# Patient Record
Sex: Male | Born: 1935 | Race: White | Hispanic: No | Marital: Single | Smoking: Never smoker
Health system: Southern US, Community
[De-identification: ages and names within clinical notes are randomized; demographics above are authoritative.]

## PROBLEM LIST (undated history)

## (undated) DIAGNOSIS — I10 Essential (primary) hypertension: Secondary | ICD-10-CM

## (undated) DIAGNOSIS — K219 Gastro-esophageal reflux disease without esophagitis: Secondary | ICD-10-CM

## (undated) DIAGNOSIS — G4700 Insomnia, unspecified: Secondary | ICD-10-CM

## (undated) DIAGNOSIS — F410 Panic disorder [episodic paroxysmal anxiety] without agoraphobia: Secondary | ICD-10-CM

## (undated) DIAGNOSIS — S2239XA Fracture of one rib, unspecified side, initial encounter for closed fracture: Secondary | ICD-10-CM

## (undated) DIAGNOSIS — J449 Chronic obstructive pulmonary disease, unspecified: Secondary | ICD-10-CM

## (undated) DIAGNOSIS — N400 Enlarged prostate without lower urinary tract symptoms: Secondary | ICD-10-CM

## (undated) DIAGNOSIS — E785 Hyperlipidemia, unspecified: Secondary | ICD-10-CM

## (undated) HISTORY — DX: Panic disorder (episodic paroxysmal anxiety): F41.0

## (undated) HISTORY — DX: Essential (primary) hypertension: I10

## (undated) HISTORY — DX: Chronic obstructive pulmonary disease, unspecified: J44.9

## (undated) HISTORY — DX: Gastro-esophageal reflux disease without esophagitis: K21.9

## (undated) HISTORY — DX: Benign prostatic hyperplasia without lower urinary tract symptoms: N40.0

## (undated) HISTORY — DX: Hyperlipidemia, unspecified: E78.5

## (undated) HISTORY — DX: Insomnia, unspecified: G47.00

## (undated) HISTORY — PX: CHOLECYSTECTOMY: SHX55

## (undated) HISTORY — DX: Fracture of one rib, unspecified side, initial encounter for closed fracture: S22.39XA

---

## 2001-06-30 ENCOUNTER — Encounter: Payer: Self-pay | Admitting: Family Medicine

## 2001-06-30 ENCOUNTER — Encounter: Admission: RE | Admit: 2001-06-30 | Discharge: 2001-06-30 | Payer: Self-pay | Admitting: Family Medicine

## 2001-07-12 ENCOUNTER — Ambulatory Visit
Admission: RE | Admit: 2001-07-12 | Discharge: 2001-07-12 | Payer: Self-pay | Admitting: Certified Registered Nurse Anesthetist

## 2003-10-17 ENCOUNTER — Emergency Department (HOSPITAL_COMMUNITY): Admission: EM | Admit: 2003-10-17 | Discharge: 2003-10-17 | Payer: Self-pay | Admitting: Emergency Medicine

## 2003-10-18 ENCOUNTER — Inpatient Hospital Stay (HOSPITAL_COMMUNITY): Admission: EM | Admit: 2003-10-18 | Discharge: 2003-10-19 | Payer: Self-pay | Admitting: Emergency Medicine

## 2004-03-16 ENCOUNTER — Emergency Department (HOSPITAL_COMMUNITY): Admission: EM | Admit: 2004-03-16 | Discharge: 2004-03-16 | Payer: Self-pay | Admitting: Emergency Medicine

## 2004-07-30 ENCOUNTER — Ambulatory Visit: Payer: Self-pay | Admitting: Pulmonary Disease

## 2004-08-05 ENCOUNTER — Ambulatory Visit: Payer: Self-pay | Admitting: Pulmonary Disease

## 2004-09-19 ENCOUNTER — Ambulatory Visit: Payer: Self-pay | Admitting: Internal Medicine

## 2004-09-19 ENCOUNTER — Inpatient Hospital Stay (HOSPITAL_COMMUNITY): Admission: AD | Admit: 2004-09-19 | Discharge: 2004-09-23 | Payer: Self-pay | Admitting: Internal Medicine

## 2004-09-21 ENCOUNTER — Encounter (INDEPENDENT_AMBULATORY_CARE_PROVIDER_SITE_OTHER): Payer: Self-pay | Admitting: *Deleted

## 2004-10-03 ENCOUNTER — Ambulatory Visit: Payer: Self-pay | Admitting: Family Medicine

## 2005-05-21 ENCOUNTER — Ambulatory Visit: Payer: Self-pay | Admitting: Family Medicine

## 2005-06-12 ENCOUNTER — Ambulatory Visit: Payer: Self-pay | Admitting: Family Medicine

## 2005-06-13 ENCOUNTER — Encounter: Admission: RE | Admit: 2005-06-13 | Discharge: 2005-06-13 | Payer: Self-pay | Admitting: Family Medicine

## 2005-09-29 ENCOUNTER — Encounter: Payer: Self-pay | Admitting: Family Medicine

## 2005-09-29 LAB — CONVERTED CEMR LAB

## 2006-02-24 ENCOUNTER — Ambulatory Visit: Payer: Self-pay | Admitting: Family Medicine

## 2006-06-04 ENCOUNTER — Ambulatory Visit: Payer: Self-pay | Admitting: Family Medicine

## 2006-10-05 ENCOUNTER — Ambulatory Visit: Payer: Self-pay | Admitting: Gastroenterology

## 2006-10-19 ENCOUNTER — Ambulatory Visit: Payer: Self-pay | Admitting: Gastroenterology

## 2006-10-31 ENCOUNTER — Ambulatory Visit: Payer: Self-pay | Admitting: Family Medicine

## 2006-11-05 ENCOUNTER — Ambulatory Visit: Payer: Self-pay | Admitting: Family Medicine

## 2006-11-09 ENCOUNTER — Ambulatory Visit: Payer: Self-pay | Admitting: Family Medicine

## 2007-02-05 ENCOUNTER — Ambulatory Visit: Payer: Self-pay | Admitting: Internal Medicine

## 2007-05-07 ENCOUNTER — Encounter: Payer: Self-pay | Admitting: Family Medicine

## 2007-05-07 DIAGNOSIS — G4700 Insomnia, unspecified: Secondary | ICD-10-CM

## 2007-05-07 DIAGNOSIS — J4489 Other specified chronic obstructive pulmonary disease: Secondary | ICD-10-CM | POA: Insufficient documentation

## 2007-05-07 DIAGNOSIS — F410 Panic disorder [episodic paroxysmal anxiety] without agoraphobia: Secondary | ICD-10-CM

## 2007-05-07 DIAGNOSIS — J449 Chronic obstructive pulmonary disease, unspecified: Secondary | ICD-10-CM

## 2007-06-08 ENCOUNTER — Ambulatory Visit: Payer: Self-pay | Admitting: Family Medicine

## 2007-06-08 DIAGNOSIS — K219 Gastro-esophageal reflux disease without esophagitis: Secondary | ICD-10-CM | POA: Insufficient documentation

## 2007-06-08 DIAGNOSIS — E785 Hyperlipidemia, unspecified: Secondary | ICD-10-CM | POA: Insufficient documentation

## 2007-06-22 ENCOUNTER — Ambulatory Visit: Payer: Self-pay | Admitting: Family Medicine

## 2007-11-02 ENCOUNTER — Encounter: Admission: RE | Admit: 2007-11-02 | Discharge: 2007-11-02 | Payer: Self-pay | Admitting: Family Medicine

## 2007-11-02 ENCOUNTER — Ambulatory Visit: Payer: Self-pay | Admitting: Family Medicine

## 2007-11-04 ENCOUNTER — Ambulatory Visit: Payer: Self-pay | Admitting: Family Medicine

## 2007-11-13 DIAGNOSIS — L509 Urticaria, unspecified: Secondary | ICD-10-CM

## 2007-11-15 ENCOUNTER — Ambulatory Visit: Payer: Self-pay | Admitting: Family Medicine

## 2007-12-13 ENCOUNTER — Ambulatory Visit: Payer: Self-pay | Admitting: Family Medicine

## 2007-12-13 DIAGNOSIS — L259 Unspecified contact dermatitis, unspecified cause: Secondary | ICD-10-CM | POA: Insufficient documentation

## 2008-06-09 ENCOUNTER — Ambulatory Visit: Payer: Self-pay | Admitting: Internal Medicine

## 2008-06-09 ENCOUNTER — Encounter: Payer: Self-pay | Admitting: Family Medicine

## 2008-06-09 LAB — CONVERTED CEMR LAB
Bilirubin Urine: NEGATIVE
Blood in Urine, dipstick: NEGATIVE
Glucose, Urine, Semiquant: NEGATIVE
Ketones, urine, test strip: NEGATIVE
Nitrite: POSITIVE
Protein, U semiquant: NEGATIVE
Specific Gravity, Urine: 1.02
Urobilinogen, UA: 0.2
pH: 6

## 2008-06-13 ENCOUNTER — Encounter: Payer: Self-pay | Admitting: Family Medicine

## 2008-06-14 ENCOUNTER — Ambulatory Visit: Payer: Self-pay | Admitting: Family Medicine

## 2008-06-14 DIAGNOSIS — B351 Tinea unguium: Secondary | ICD-10-CM | POA: Insufficient documentation

## 2008-06-14 LAB — CONVERTED CEMR LAB
ALT: 21 units/L (ref 0–53)
AST: 20 units/L (ref 0–37)
Albumin: 4 g/dL (ref 3.5–5.2)
Alkaline Phosphatase: 41 units/L (ref 39–117)
BUN: 15 mg/dL (ref 6–23)
Basophils Absolute: 0 10*3/uL (ref 0.0–0.1)
Basophils Relative: 0.2 % (ref 0.0–3.0)
Bilirubin Urine: NEGATIVE
Bilirubin, Direct: 0.2 mg/dL (ref 0.0–0.3)
Blood in Urine, dipstick: NEGATIVE
CO2: 30 meq/L (ref 19–32)
Calcium: 9.3 mg/dL (ref 8.4–10.5)
Chloride: 108 meq/L (ref 96–112)
Cholesterol: 130 mg/dL (ref 0–200)
Creatinine, Ser: 1.1 mg/dL (ref 0.4–1.5)
Eosinophils Absolute: 0.1 10*3/uL (ref 0.0–0.7)
Eosinophils Relative: 2.1 % (ref 0.0–5.0)
GFR calc Af Amer: 83 mL/min
GFR calc non Af Amer: 69 mL/min
Glucose, Bld: 96 mg/dL (ref 70–99)
Glucose, Urine, Semiquant: NEGATIVE
HCT: 40.2 % (ref 39.0–52.0)
HDL: 53.7 mg/dL (ref >39.0)
Hemoglobin: 13.9 g/dL (ref 13.0–17.0)
Ketones, urine, test strip: NEGATIVE
LDL Cholesterol: 64 mg/dL (ref 0–99)
Lymphocytes Relative: 23.8 % (ref 12.0–46.0)
MCHC: 34.7 g/dL (ref 30.0–36.0)
MCV: 93.7 fL (ref 78.0–100.0)
Monocytes Absolute: 0.5 10*3/uL (ref 0.1–1.0)
Monocytes Relative: 9.2 % (ref 3.0–12.0)
Neutro Abs: 3.3 10*3/uL (ref 1.4–7.7)
Neutrophils Relative %: 64.7 % (ref 43.0–77.0)
Nitrite: NEGATIVE
PSA: 2.44 ng/mL (ref 0.10–4.00)
Platelets: 183 10*3/uL (ref 150–400)
Potassium: 3.9 meq/L (ref 3.5–5.1)
Protein, U semiquant: NEGATIVE
RBC: 4.28 M/uL (ref 4.22–5.81)
RDW: 12.8 % (ref 11.5–14.6)
Sodium: 142 meq/L (ref 135–145)
Specific Gravity, Urine: 1.02
TSH: 0.81 microintl units/mL (ref 0.35–5.50)
Total Bilirubin: 1.3 mg/dL — ABNORMAL HIGH (ref 0.3–1.2)
Total CHOL/HDL Ratio: 2.4
Total Protein: 7.1 g/dL (ref 6.0–8.3)
Triglycerides: 60 mg/dL (ref 0–149)
Urobilinogen, UA: 1
VLDL: 12 mg/dL (ref 0–40)
WBC Urine, dipstick: NEGATIVE
WBC: 5.1 10*3/uL (ref 4.5–10.5)
pH: 7

## 2008-12-23 ENCOUNTER — Ambulatory Visit: Payer: Self-pay | Admitting: *Deleted

## 2009-01-15 ENCOUNTER — Ambulatory Visit: Payer: Self-pay | Admitting: Family Medicine

## 2009-01-15 DIAGNOSIS — IMO0002 Reserved for concepts with insufficient information to code with codable children: Secondary | ICD-10-CM

## 2009-05-03 ENCOUNTER — Ambulatory Visit: Payer: Self-pay | Admitting: Family Medicine

## 2009-06-07 ENCOUNTER — Ambulatory Visit: Payer: Self-pay | Admitting: Family Medicine

## 2009-08-16 ENCOUNTER — Ambulatory Visit: Payer: Self-pay | Admitting: Family Medicine

## 2009-08-16 DIAGNOSIS — R350 Frequency of micturition: Secondary | ICD-10-CM | POA: Insufficient documentation

## 2009-08-16 LAB — CONVERTED CEMR LAB
Bilirubin Urine: NEGATIVE
Glucose, Urine, Semiquant: NEGATIVE
Ketones, urine, test strip: NEGATIVE
Nitrite: POSITIVE
Protein, U semiquant: 100
Specific Gravity, Urine: 1.01
Urobilinogen, UA: 0.2
pH: 7

## 2009-08-17 ENCOUNTER — Ambulatory Visit: Payer: Self-pay | Admitting: Family Medicine

## 2009-08-17 DIAGNOSIS — B372 Candidiasis of skin and nail: Secondary | ICD-10-CM | POA: Insufficient documentation

## 2009-09-05 ENCOUNTER — Ambulatory Visit: Payer: Self-pay | Admitting: Family Medicine

## 2009-09-11 ENCOUNTER — Ambulatory Visit: Payer: Self-pay | Admitting: Family Medicine

## 2010-02-04 ENCOUNTER — Ambulatory Visit: Payer: Self-pay | Admitting: Family Medicine

## 2010-02-08 ENCOUNTER — Ambulatory Visit: Payer: Self-pay | Admitting: Family Medicine

## 2010-06-13 ENCOUNTER — Ambulatory Visit: Payer: Self-pay | Admitting: Family Medicine

## 2010-06-14 ENCOUNTER — Telehealth: Payer: Self-pay | Admitting: *Deleted

## 2010-10-27 LAB — CONVERTED CEMR LAB
ALT: 14 units/L (ref 0–53)
ALT: 15 units/L (ref 0–53)
ALT: 15 units/L (ref 0–53)
AST: 15 units/L (ref 0–37)
AST: 15 units/L (ref 0–37)
AST: 16 units/L (ref 0–37)
Albumin: 3.9 g/dL (ref 3.5–5.2)
Albumin: 4 g/dL (ref 3.5–5.2)
Albumin: 4.1 g/dL (ref 3.5–5.2)
Alkaline Phosphatase: 43 units/L (ref 39–117)
Alkaline Phosphatase: 47 units/L (ref 39–117)
Alkaline Phosphatase: 50 units/L (ref 39–117)
BUN: 11 mg/dL (ref 6–23)
BUN: 14 mg/dL (ref 6–23)
BUN: 16 mg/dL (ref 6–23)
Basophils Absolute: 0 10*3/uL (ref 0.0–0.1)
Basophils Absolute: 0 10*3/uL (ref 0.0–0.1)
Basophils Absolute: 0 10*3/uL (ref 0.0–0.1)
Basophils Relative: 0 % (ref 0.0–3.0)
Basophils Relative: 0.1 % (ref 0.0–1.0)
Basophils Relative: 0.1 % (ref 0.0–3.0)
Bilirubin Urine: NEGATIVE
Bilirubin, Direct: 0.1 mg/dL (ref 0.0–0.3)
Bilirubin, Direct: 0.2 mg/dL (ref 0.0–0.3)
Bilirubin, Direct: 0.3 mg/dL (ref 0.0–0.3)
Blood in Urine, dipstick: NEGATIVE
CO2: 30 meq/L (ref 19–32)
CO2: 32 meq/L (ref 19–32)
CO2: 32 meq/L (ref 19–32)
Calcium: 9.1 mg/dL (ref 8.4–10.5)
Calcium: 9.4 mg/dL (ref 8.4–10.5)
Calcium: 9.5 mg/dL (ref 8.4–10.5)
Chloride: 100 meq/L (ref 96–112)
Chloride: 102 meq/L (ref 96–112)
Chloride: 102 meq/L (ref 96–112)
Cholesterol: 156 mg/dL (ref 0–200)
Cholesterol: 160 mg/dL (ref 0–200)
Cholesterol: 169 mg/dL (ref 0–200)
Creatinine, Ser: 0.9 mg/dL (ref 0.4–1.5)
Creatinine, Ser: 1 mg/dL (ref 0.4–1.5)
Creatinine, Ser: 1 mg/dL (ref 0.4–1.5)
Eosinophils Absolute: 0.1 10*3/uL (ref 0.0–0.6)
Eosinophils Absolute: 0.1 10*3/uL (ref 0.0–0.7)
Eosinophils Absolute: 0.2 10*3/uL (ref 0.0–0.7)
Eosinophils Relative: 1.1 % (ref 0.0–5.0)
Eosinophils Relative: 1.4 % (ref 0.0–5.0)
Eosinophils Relative: 3.4 % (ref 0.0–5.0)
GFR calc Af Amer: 106 mL/min
GFR calc non Af Amer: 76.75 mL/min (ref >60)
GFR calc non Af Amer: 78.35 mL/min (ref >60)
GFR calc non Af Amer: 87 mL/min
Glucose, Bld: 88 mg/dL (ref 70–99)
Glucose, Bld: 88 mg/dL (ref 70–99)
Glucose, Bld: 96 mg/dL (ref 70–99)
Glucose, Urine, Semiquant: NEGATIVE
HCT: 37.9 % — ABNORMAL LOW (ref 39.0–52.0)
HCT: 38.1 % — ABNORMAL LOW (ref 39.0–52.0)
HCT: 41.9 % (ref 39.0–52.0)
HDL: 41.9 mg/dL (ref >39.0)
HDL: 43 mg/dL (ref >39.00)
HDL: 48.3 mg/dL (ref >39.00)
Hemoglobin: 13.4 g/dL (ref 13.0–17.0)
Hemoglobin: 13.4 g/dL (ref 13.0–17.0)
Hemoglobin: 14.8 g/dL (ref 13.0–17.0)
LDL Cholesterol: 103 mg/dL — ABNORMAL HIGH (ref 0–99)
LDL Cholesterol: 112 mg/dL — ABNORMAL HIGH (ref 0–99)
LDL Cholesterol: 97 mg/dL (ref 0–99)
Lymphocytes Relative: 25 % (ref 12.0–46.0)
Lymphocytes Relative: 25 % (ref 12.0–46.0)
Lymphocytes Relative: 30.4 % (ref 12.0–46.0)
Lymphs Abs: 1.3 10*3/uL (ref 0.7–4.0)
Lymphs Abs: 1.5 10*3/uL (ref 0.7–4.0)
MCHC: 35.1 g/dL (ref 30.0–36.0)
MCHC: 35.4 g/dL (ref 30.0–36.0)
MCHC: 35.5 g/dL (ref 30.0–36.0)
MCV: 90.2 fL (ref 78.0–100.0)
MCV: 95 fL (ref 78.0–100.0)
MCV: 97.9 fL (ref 78.0–100.0)
Monocytes Absolute: 0.4 10*3/uL (ref 0.1–1.0)
Monocytes Absolute: 0.4 10*3/uL (ref 0.1–1.0)
Monocytes Absolute: 0.5 10*3/uL (ref 0.2–0.7)
Monocytes Relative: 7 % (ref 3.0–12.0)
Monocytes Relative: 7.8 % (ref 3.0–11.0)
Monocytes Relative: 8.2 % (ref 3.0–12.0)
Neutro Abs: 2.8 10*3/uL (ref 1.4–7.7)
Neutro Abs: 3.6 10*3/uL (ref 1.4–7.7)
Neutro Abs: 4.5 10*3/uL (ref 1.4–7.7)
Neutrophils Relative %: 58 % (ref 43.0–77.0)
Neutrophils Relative %: 65.7 % (ref 43.0–77.0)
Neutrophils Relative %: 66.8 % (ref 43.0–77.0)
Nitrite: NEGATIVE
PSA: 0.94 ng/mL (ref 0.10–4.00)
PSA: 1.16 ng/mL (ref 0.10–4.00)
PSA: 1.43 ng/mL (ref 0.10–4.00)
Platelets: 146 10*3/uL — ABNORMAL LOW (ref 150.0–400.0)
Platelets: 161 10*3/uL (ref 150.0–400.0)
Platelets: 202 10*3/uL (ref 150–400)
Potassium: 3.6 meq/L (ref 3.5–5.1)
Potassium: 3.9 meq/L (ref 3.5–5.1)
Potassium: 4.4 meq/L (ref 3.5–5.1)
RBC: 3.87 M/uL — ABNORMAL LOW (ref 4.22–5.81)
RBC: 4.01 M/uL — ABNORMAL LOW (ref 4.22–5.81)
RBC: 4.64 M/uL (ref 4.22–5.81)
RDW: 12.3 % (ref 11.5–14.6)
RDW: 12.9 % (ref 11.5–14.6)
RDW: 13.9 % (ref 11.5–14.6)
Sodium: 138 meq/L (ref 135–145)
Sodium: 140 meq/L (ref 135–145)
Sodium: 141 meq/L (ref 135–145)
Specific Gravity, Urine: 1.02
TSH: 0.88 microintl units/mL (ref 0.35–5.50)
TSH: 0.93 microintl units/mL (ref 0.35–5.50)
TSH: 1.54 microintl units/mL (ref 0.35–5.50)
Total Bilirubin: 1.3 mg/dL — ABNORMAL HIGH (ref 0.3–1.2)
Total Bilirubin: 1.4 mg/dL — ABNORMAL HIGH (ref 0.3–1.2)
Total Bilirubin: 1.5 mg/dL — ABNORMAL HIGH (ref 0.3–1.2)
Total CHOL/HDL Ratio: 3
Total CHOL/HDL Ratio: 4
Total CHOL/HDL Ratio: 4
Total Protein: 6.7 g/dL (ref 6.0–8.3)
Total Protein: 6.8 g/dL (ref 6.0–8.3)
Total Protein: 6.8 g/dL (ref 6.0–8.3)
Triglycerides: 56 mg/dL (ref 0.0–149.0)
Triglycerides: 70 mg/dL (ref 0.0–149.0)
Triglycerides: 75 mg/dL (ref 0–149)
Urobilinogen, UA: 2
VLDL: 11.2 mg/dL (ref 0.0–40.0)
VLDL: 14 mg/dL (ref 0.0–40.0)
VLDL: 15 mg/dL (ref 0–40)
WBC Urine, dipstick: NEGATIVE
WBC: 4.9 10*3/uL (ref 4.5–10.5)
WBC: 5.4 10*3/uL (ref 4.5–10.5)
WBC: 6.8 10*3/uL (ref 4.5–10.5)
pH: 7.5

## 2010-10-31 NOTE — Assessment & Plan Note (Signed)
Summary: 4 day rov per doc/njr   Vital Signs:  Patient profile:   77 year old male Weight:      146 pounds Temp:     98.2 degrees F oral BP sitting:   120 / 70  (right arm) CC: FU / Upper Resp   Primary Care Provider:  Roderick Pee MD  CC:  FU / Upper Resp.  History of Present Illness: Peter Wolfe is a 77 year old male with underlying COPD and asthma who comes in today for follow-up of asthma.  He had a flare and was seen 3 days ago and started on prednisone 40 mg daily.  He comes back today for follow-up.  States he feels much better.  Has some sleep dysfunction from the prednisone.  Otherwise well.    Allergies: 1)  ! Fluvirin (Influenza Vac Typ A&b Surf Ant) 2)  ! Hydrocodone-Ibuprofen (Hydrocodone-Ibuprofen)  Past History:  Past medical, surgical, family and social histories (including risk factors) reviewed for relevance to current acute and chronic problems.  Past Medical History: Reviewed history from 06/08/2007 and no changes required. COpd insomnia panic attack Hyperlipidemia GERD htn bph  Past Surgical History: Reviewed history from 12/23/2008 and no changes required. Denies surgical history  Family History: Reviewed history and no changes required.  Social History: Reviewed history from 11/02/2007 and no changes required. Retired Single Never Smoked Alcohol use-no Drug use-no Regular exercise-yes  Review of Systems      See HPI  Physical Exam  General:  Well-developed,well-nourished,in no acute distress; alert,appropriate and cooperative throughout examination Lungs:  Normal respiratory effort, chest expands symmetrically. Lungs are clear to auscultation, no crackles or wheezes.   Impression & Recommendations:  Problem # 1:  COPD (ICD-496) Assessment Improved  His updated medication list for this problem includes:    Proventil Hfa 108 (90 Base) Mcg/act Aers (Albuterol sulfate) .Marland Kitchen... 2 ps qid  Complete Medication List: 1)  Prevacid 30  Mg Cpdr (Lansoprazole) .... Take 1 capsule by mouth once a day 2)  Catapres 0.1 Mg Tabs (Clonidine hcl) .... Take 1 tablet by mouth once a day 3)  Maxzide 75-50 Mg Tabs (Triamterene-hctz) .... Take 1 tablet by mouth once a day 4)  Flomax 0.4 Mg Cp24 (Tamsulosin hcl) .... Take 1 capsule by mouth once a day 5)  Aspirin 325 Mg Tbec (Aspirin) .... Once tab monday wednesday friday 6)  Buspar 30 Mg Tabs (Buspirone hcl) .Marland Kitchen.. 1 tab @ bedtime 7)  Prozac 20 Mg Caps (Fluoxetine hcl) .... Take 1 capsule by mouth once a day 8)  Trazodone Hcl 100 Mg Tabs (Trazodone hcl) .... Take 1 tablet by mouth at bedtime 9)  Simvastatin 10 Mg Tabs (Simvastatin) .... Take 1 tablet by mouth at bedtime 10)  Vitamin D 1000 Unit Tabs (Cholecalciferol) .... Once daily 11)  Fish Oil 1000 Mg Caps (Omega-3 fatty acids) .... Once daily 12)  Proventil Hfa 108 (90 Base) Mcg/act Aers (Albuterol sulfate) .... 2 ps qid 13)  Doxycycline Hyclate 100 Mg Caps (Doxycycline hyclate) .... Take 1 tablet by mouth two times a day 14)  Prednisone 20 Mg Tabs (Prednisone) .... Uad  Patient Instructions: 1)  taper the prednisone by taking one tablet x 3 days, a half x 3 days, then half a tablet every other day for two week taper. 2)  Finish the doxycycline.  Stop the albuterol

## 2010-10-31 NOTE — Assessment & Plan Note (Signed)
Summary: congestion-cough//ccm   Vital Signs:  Patient profile:   77 year old male Weight:      146 pounds BMI:     23.30 Temp:     98.1 degrees F oral BP sitting:   118 / 70  (left arm) Cuff size:   regular  Vitals Entered By: Kathrynn Speed CMA (Feb 04, 2010 10:35 AM) CC: chest congestion, cough Is Patient Diabetic? No   Primary Care Provider:  Roderick Pee MD  CC:  chest congestion and cough.  History of Present Illness: Peter Wolfe is a 77 year old single male, nonsmoker, with underlying COPD, who comes in with a two week history of chest congestion, cough, and wheezing.  He said no, fever or chills.  He says some yellow sputum production.  No blood.  No weight loss, earache, etc.  Allergies: 1)  ! Fluvirin (Influenza Vac Typ A&b Surf Ant) 2)  ! Hydrocodone-Ibuprofen (Hydrocodone-Ibuprofen)  Past History:  Past medical, surgical, family and social histories (including risk factors) reviewed for relevance to current acute and chronic problems.  Past Medical History: Reviewed history from 06/08/2007 and no changes required. COpd insomnia panic attack Hyperlipidemia GERD htn bph  Past Surgical History: Reviewed history from 12/23/2008 and no changes required. Denies surgical history  Family History: Reviewed history and no changes required.  Social History: Reviewed history from 11/02/2007 and no changes required. Retired Single Never Smoked Alcohol use-no Drug use-no Regular exercise-yes  Review of Systems      See HPI  Physical Exam  General:  Well-developed,well-nourished,in no acute distress; alert,appropriate and cooperative throughout examination Head:  Normocephalic and atraumatic without obvious abnormalities. No apparent alopecia or balding. Eyes:  No corneal or conjunctival inflammation noted. EOMI. Perrla. Funduscopic exam benign, without hemorrhages, exudates or papilledema. Vision grossly normal. Ears:  External ear exam shows no  significant lesions or deformities.  Otoscopic examination reveals clear canals, tympanic membranes are intact bilaterally without bulging, retraction, inflammation or discharge. Hearing is grossly normal bilaterally. Nose:  External nasal examination shows no deformity or inflammation. Nasal mucosa are pink and moist without lesions or exudates. Mouth:  Oral mucosa and oropharynx without lesions or exudates.  Teeth in good repair. Neck:  No deformities, masses, or tenderness noted. Chest Wall:  No deformities, masses, tenderness or gynecomastia noted. Lungs:  symmetrical bilateral wheezing, inspiratory and expiratory   Impression & Recommendations:  Problem # 1:  COPD (ICD-496) Assessment Deteriorated  His updated medication list for this problem includes:    Proventil Hfa 108 (90 Base) Mcg/act Aers (Albuterol sulfate) .Marland Kitchen... 2 ps qid  Complete Medication List: 1)  Prevacid 30 Mg Cpdr (Lansoprazole) .... Take 1 capsule by mouth once a day 2)  Catapres 0.1 Mg Tabs (Clonidine hcl) .... Take 1 tablet by mouth once a day 3)  Maxzide 75-50 Mg Tabs (Triamterene-hctz) .... Take 1 tablet by mouth once a day 4)  Flomax 0.4 Mg Cp24 (Tamsulosin hcl) .... Take 1 capsule by mouth once a day 5)  Aspirin 325 Mg Tbec (Aspirin) .... Once tab monday wednesday friday 6)  Buspar 30 Mg Tabs (Buspirone hcl) .Marland Kitchen.. 1 tab @ bedtime 7)  Prozac 20 Mg Caps (Fluoxetine hcl) .... Take 1 capsule by mouth once a day 8)  Trazodone Hcl 100 Mg Tabs (Trazodone hcl) .... Take 1 tablet by mouth at bedtime 9)  Simvastatin 10 Mg Tabs (Simvastatin) .... Take 1 tablet by mouth at bedtime 10)  Vitamin D 1000 Unit Tabs (Cholecalciferol) .... Once daily 11)  Lidex 0.05 % Crea (Fluocinonide) .... Apply two times a day 12)  Fish Oil 1000 Mg Caps (Omega-3 fatty acids) .... Once daily 13)  Proventil Hfa 108 (90 Base) Mcg/act Aers (Albuterol sulfate) .... 2 ps qid 14)  Doxycycline Hyclate 100 Mg Caps (Doxycycline hyclate) .... Take 1  tablet by mouth two times a day 15)  Prednisone 20 Mg Tabs (Prednisone) .... Uad  Patient Instructions: 1)  begin prednisone two tabs daily, drink, 30 ounces of water daily, vaporizer and your bedroom at night, began doxycycline 100 mg b.i.d. 2)  Also, albuterol two puffs 4 times a day. 3)  Return Friday for follow-up Prescriptions: PREDNISONE 20 MG TABS (PREDNISONE) UAD  #50 x 1   Entered and Authorized by:   Roderick Pee MD   Signed by:   Roderick Pee MD on 02/04/2010   Method used:   Electronically to        [facility]  [phone]* (retail)       [address] [place], [place]  [address]       Ph: [phone] or [phone]       Fax: [phone]   RxID:   [id number] PROVENTIL HFA 108 (90 BASE) MCG/ACT AERS (ALBUTEROL SULFATE) 2 ps qid  #1 x 1   Entered and Authorized by:   Roderick Pee MD   Signed by:   Roderick Pee MD on 02/04/2010   Method used:   Electronically to        [facility]  [phone]* (retail)       [address] [place], [place]  [address]       Ph: [phone] or [phone]       Fax: [phone]   RxID:   [id number] DOXYCYCLINE HYCLATE 100 MG CAPS (DOXYCYCLINE HYCLATE) Take 1 tablet by mouth two times a day  #30 x 1   Entered and Authorized by:   Roderick Pee MD   Signed by:   Roderick Pee MD on 02/04/2010   Method used:   Electronically to        [facility]  [phone]* (retail)       [address] [place], [place]  [address]       Ph: [phone] or [phone]       Fax: [phone]   RxID:   [id number]

## 2010-10-31 NOTE — Progress Notes (Signed)
Summary: lab results  Phone Note Call from Patient Call back at Home Phone [phone]   Caller: Patient Call For: Roderick Pee MD Summary of Call: pt is returning Karmon Andis call needs lab results Initial call taken by: Heron Sabins,  June 14, 2010 9:23 AM  Follow-up for Phone Call        Called pt he is aware of lab results, mailing copy today Follow-up by: Kathrynn Speed CMA,  June 14, 2010 9:28 AM

## 2010-10-31 NOTE — Assessment & Plan Note (Signed)
Summary: pt will come in fasting/njr   Vital Signs:  Patient profile:   77 year old male Height:      66.5 inches Weight:      142 pounds BMI:     22.66 Temp:     98.5 degrees F oral BP sitting:   128 / 80  (right arm) Cuff size:   regular  Vitals Entered By: Kathrynn Speed CMA (June 13, 2010 9:10 AM) CC: cpx, fasting, src Is Patient Diabetic? No   Primary Care Provider:  Roderick Pee MD  CC:  cpx, fasting, and src.  History of Present Illness: Peter Wolfe is a 77 year old single male, Dan Humphreys, who comes in today for general physical examination because of underlying reflux esophagitis, hypertension, depression, hyperlipidemia, BPH.  His reflux esophagitis is treated with Prevacid 30 mg daily asymptomatic on medication.  For hypertension.  He takes Catapres, .1 mg daily, Maxzide, 7550 daily, BP 120/80.  He also takes Prozac 20 mg nightly along with trazodone 100 mg nightly and BuSpar 30 mg nightly for sleep dysfunction.  Doing well sleeping well.  He also takes simvastatin 10 mg daily for hyperlipidemia, and Flomax .4, daily for BPH.  He gets routine eye care, dental care, colonoscopy, normal, tetanus, 2002, Pneumovax 2007, shingles 2009, declines flu shot.  He says he is allergic to eggs. Here for Medicare AWV:  1.   Risk factors based on Past M, S, F history:reviewed.  No change 2.   Physical Activities: walks 90 minutes daily 3.   Depression/mood: good mood.  No depression 4.   Hearing: normal 5.   ADL's: normal 6.   Fall Risk: we did none 7.   Home Safety: no guns in the house 8.   Height, weight, &visual acuity:height weight stable and Uy exam 9.   Counseling: continue good health habits 10.   Labs ordered based on risk factors: done today 11.           Referral Coordination........none indicated 12.           Care Plan.......Marland Kitchenreviewed all medications suggest he takes Flomax, Monday, Wednesday, Friday 13.            Cognitive Assessment ........normal  Preventive  Screening-Counseling & Management  Alcohol-Tobacco     Smoking Status: never  Current Medications (verified): 1)  Prevacid 30 Mg  Cpdr (Lansoprazole) .... Take 1 Capsule By Mouth Once A Day 2)  Catapres 0.1 Mg  Tabs (Clonidine Hcl) .... Take 1 Tablet By Mouth Once A Day 3)  Maxzide 75-50 Mg  Tabs (Triamterene-Hctz) .... Take 1 Tablet By Mouth Once A Day 4)  Flomax 0.4 Mg  Cp24 (Tamsulosin Hcl) .... Take 1 Capsule By Mouth Once A Day 5)  Aspirin 325 Mg  Tbec (Aspirin) .... Once Tab Monday Wednesday Friday 6)  Buspar 30 Mg  Tabs (Buspirone Hcl) .Marland Kitchen.. 1 Tab @ Bedtime 7)  Prozac 20 Mg  Caps (Fluoxetine Hcl) .... Take 1 Capsule By Mouth Once A Day 8)  Trazodone Hcl 100 Mg  Tabs (Trazodone Hcl) .... Take 1 Tablet By Mouth At Bedtime 9)  Simvastatin 10 Mg  Tabs (Simvastatin) .... Take 1 Tablet By Mouth At Bedtime 10)  Vitamin D 1000 Unit Tabs (Cholecalciferol) .... Once Daily 11)  Fish Oil 1000 Mg Caps (Omega-3 Fatty Acids) .... Once Daily  Allergies (verified): 1)  ! Fluvirin (Influenza Vac Typ A&b Surf Ant) 2)  ! Hydrocodone-Ibuprofen (Hydrocodone-Ibuprofen)  Past History:  Past medical, surgical, family and social histories (  including risk factors) reviewed, and no changes noted (except as noted below).  Past Medical History: Reviewed history from 06/08/2007 and no changes required. COpd insomnia panic attack Hyperlipidemia GERD htn bph  Past Surgical History: Reviewed history from 12/23/2008 and no changes required. Denies surgical history  Family History: Reviewed history and no changes required.  Social History: Reviewed history from 11/02/2007 and no changes required. Retired Single Never Smoked Alcohol use-no Drug use-no Regular exercise-yes  Review of Systems      See HPI  Physical Exam  General:  Well-developed,well-nourished,in no acute distress; alert,appropriate and cooperative throughout examination Head:  Normocephalic and atraumatic without obvious  abnormalities. No apparent alopecia or balding. Eyes:  No corneal or conjunctival inflammation noted. EOMI. Perrla. Funduscopic exam benign, without hemorrhages, exudates or papilledema. Vision grossly normal. Ears:  External ear exam shows no significant lesions or deformities.  Otoscopic examination reveals clear canals, tympanic membranes are intact bilaterally without bulging, retraction, inflammation or discharge. Hearing is grossly normal bilaterally. Nose:  External nasal examination shows no deformity or inflammation. Nasal mucosa are pink and moist without lesions or exudates. Mouth:  Oral mucosa and oropharynx without lesions or exudates.  Teeth in good repair. Neck:  No deformities, masses, or tenderness noted. Chest Wall:  No deformities, masses, tenderness or gynecomastia noted. Breasts:  No masses or gynecomastia noted Lungs:  Normal respiratory effort, chest expands symmetrically. Lungs are clear to auscultation, no crackles or wheezes. Heart:  Normal rate and regular rhythm. S1 and S2 normal without gallop, murmur, click, rub or other extra sounds. Abdomen:  Bowel sounds positive,abdomen soft and non-tender without masses, organomegaly or hernias noted. Rectal:  No external abnormalities noted. Normal sphincter tone. No rectal masses or tenderness. Genitalia:  Testes bilaterally descended without nodularity, tenderness or masses. No scrotal masses or lesions. No penis lesions or urethral discharge. Prostate:  Prostate gland firm and smooth, no enlargement, nodularity, tenderness, mass, asymmetry or induration. Msk:  No deformity or scoliosis noted of thoracic or lumbar spine.   Pulses:  R and L carotid,radial,femoral,dorsalis pedis and posterior tibial pulses are full and equal bilaterally Extremities:  No clubbing, cyanosis, edema, or deformity noted with normal full range of motion of all joints.   Neurologic:  No cranial nerve deficits noted. Station and gait are normal. Plantar  reflexes are down-going bilaterally. DTRs are symmetrical throughout. Sensory, motor and coordinative functions appear intact. Skin:  Intact without suspicious lesions or rashes Cervical Nodes:  No lymphadenopathy noted Axillary Nodes:  No palpable lymphadenopathy Inguinal Nodes:  No significant adenopathy Psych:  Cognition and judgment appear intact. Alert and cooperative with normal attention span and concentration. No apparent delusions, illusions, hallucinations   Impression & Recommendations:  Problem # 1:  GERD (ICD-530.81) Assessment Improved  His updated medication list for this problem includes:    Prevacid 30 Mg Cpdr (Lansoprazole) .Marland Kitchen... Take 1 capsule by mouth once a day  Orders: Prescription Created Electronically [phone]) Medicare -1st Annual Wellness Visit [phone]) Urinalysis-dipstick only (Medicare patient) [phone]) Venipuncture (16010) Specimen Handling (93235) TLB-Lipid Panel (80061-LIPID) TLB-BMP (Basic Metabolic Panel-BMET) (80048-METABOL) TLB-CBC Platelet - w/Differential (85025-CBCD) TLB-Hepatic/Liver Function Pnl (80076-HEPATIC) TLB-TSH (Thyroid Stimulating Hormone) (84443-TSH) TLB-PSA (Prostate Specific Antigen) (84153-PSA)  Problem # 2:  HYPERLIPIDEMIA (ICD-272.4) Assessment: Improved  His updated medication list for this problem includes:    Simvastatin 10 Mg Tabs (Simvastatin) .Marland Kitchen... Take 1 tablet by mouth at bedtime  Orders: Prescription Created Electronically [phone]) Medicare -1st Annual Wellness Visit [phone]) Urinalysis-dipstick only (Medicare patient) [phone]) Venipuncture [phone])  EKG w/ Interpretation (93000) Specimen Handling (95284) TLB-Lipid Panel (80061-LIPID) TLB-BMP (Basic Metabolic Panel-BMET) (80048-METABOL) TLB-CBC Platelet - w/Differential (85025-CBCD) TLB-Hepatic/Liver Function Pnl (80076-HEPATIC) TLB-TSH (Thyroid Stimulating Hormone) (84443-TSH) TLB-PSA (Prostate Specific Antigen) (84153-PSA)  Problem # 3:  HEALTH SCREENING  (ICD-V70.0) Assessment: Unchanged  Orders: Prescription Created Electronically [phone]) Medicare -1st Annual Wellness Visit [phone]) Urinalysis-dipstick only (Medicare patient) ([phone]) Venipuncture (03474) EKG w/ Interpretation (93000) Specimen Handling (25956) TLB-Lipid Panel (80061-LIPID) TLB-BMP (Basic Metabolic Panel-BMET) (80048-METABOL) TLB-CBC Platelet - w/Differential (85025-CBCD) TLB-Hepatic/Liver Function Pnl (80076-HEPATIC) TLB-TSH (Thyroid Stimulating Hormone) (84443-TSH) TLB-PSA (Prostate Specific Antigen) (84153-PSA)  Problem # 4:  INSOMNIA (ICD-780.52) Assessment: Improved  Orders: Prescription Created Electronically [phone]) Medicare -1st Annual Wellness Visit [phone]) Urinalysis-dipstick only (Medicare patient) ([phone]) Venipuncture (60630) TLB-Lipid Panel (80061-LIPID) TLB-BMP (Basic Metabolic Panel-BMET) (80048-METABOL) TLB-CBC Platelet - w/Differential (85025-CBCD) TLB-Hepatic/Liver Function Pnl (80076-HEPATIC) TLB-TSH (Thyroid Stimulating Hormone) (84443-TSH) TLB-PSA (Prostate Specific Antigen) (84153-PSA)  Complete Medication List: 1)  Prevacid 30 Mg Cpdr (Lansoprazole) .... Take 1 capsule by mouth once a day 2)  Catapres 0.1 Mg Tabs (Clonidine hcl) .... Take 1 tablet by mouth once a day 3)  Maxzide 75-50 Mg Tabs (Triamterene-hctz) .... Take 1 tablet by mouth once a day 4)  Flomax 0.4 Mg Cp24 (Tamsulosin hcl) .... Take 1 capsule by mouth once a day 5)  Aspirin 325 Mg Tbec (Aspirin) .... Once tab monday wednesday friday 6)  Buspar 30 Mg Tabs (Buspirone hcl) .Marland Kitchen.. 1 tab @ bedtime 7)  Prozac 20 Mg Caps (Fluoxetine hcl) .... Take 1 capsule by mouth once a day 8)  Trazodone Hcl 100 Mg Tabs (Trazodone hcl) .... Take 1 tablet by mouth at bedtime 9)  Simvastatin 10 Mg Tabs (Simvastatin) .... Take 1 tablet by mouth at bedtime 10)  Vitamin D 1000 Unit Tabs (Cholecalciferol) .... Once daily 11)  Fish Oil 1000 Mg Caps (Omega-3 fatty acids) .... Once daily  Patient  Instructions: 1)  Please schedule a follow-up appointment in 1 year. 2)  It is important that you exercise regularly at least 20 minutes 5 times a week. If you develop chest pain, have severe difficulty breathing, or feel very tired , stop exercising immediately and seek medical attention. 3)  decrease the Flomax to one tablet Monday, Wednesday, Friday Prescriptions: SIMVASTATIN 10 MG  TABS (SIMVASTATIN) Take 1 tablet by mouth at bedtime  #100 Tablet x 3   Entered and Authorized by:   Roderick Pee MD   Signed by:   Roderick Pee MD on 06/13/2010   Method used:   Electronically to        [facility]  [phone]* (retail)       [address] [place], [place]  [address]       Ph: [phone] or [phone]       Fax: [phone]   RxID:   [id number] TRAZODONE HCL 100 MG  TABS (TRAZODONE HCL) Take 1 tablet by mouth at bedtime  #100 Tablet x 3   Entered and Authorized by:   Roderick Pee MD   Signed by:   Roderick Pee MD on 06/13/2010   Method used:   Electronically to        [facility]  [phone]* (retail)       [address] [place], [place]  [address]       Ph: [phone] or [phone]       Fax: [phone]   RxID:  [id number] PROZAC 20 MG  CAPS (FLUOXETINE HCL) Take 1 capsule by mouth once a day  #100 x 3   Entered and Authorized by:   Roderick Pee MD   Signed by:   Roderick Pee MD on 06/13/2010   Method used:   Electronically to        [facility]  [phone]* (retail)       [address] [place], [place]  [address]       Ph: [phone] or [phone]       Fax: [phone]   RxID:   [id number] BUSPAR 30 MG  TABS (BUSPIRONE HCL) 1 tab @ bedtime  #100 x 3   Entered and Authorized by:   Roderick Pee MD   Signed by:   Roderick Pee MD on 06/13/2010   Method used:   Electronically to        [facility]  [phone]* (retail)       [address] [place], [place]  [address]       Ph: [phone] or  [phone]       Fax: [phone]   RxID:   [id number] FLOMAX 0.4 MG  CP24 (TAMSULOSIN HCL) Take 1 capsule by mouth once a day  #100 x 2   Entered and Authorized by:   Roderick Pee MD   Signed by:   Roderick Pee MD on 06/13/2010   Method used:   Electronically to        [facility]  [phone]* (retail)       [address] [place], [place]  [address]       Ph: [phone] or [phone]       Fax: [phone]   RxID:   [id number] MAXZIDE 75-50 MG  TABS (TRIAMTERENE-HCTZ) Take 1 tablet by mouth once a day  #100 x 3   Entered and Authorized by:   Roderick Pee MD   Signed by:   Roderick Pee MD on 06/13/2010   Method used:   Electronically to        [facility]  [phone]* (retail)       [address] [place], [place]  [address]       Ph: [phone] or [phone]       Fax: [phone]   RxID:   [id number] CATAPRES 0.1 MG  TABS (CLONIDINE HCL) Take 1 tablet by mouth once a day  #100 Tablet x 3   Entered and Authorized by:   Roderick Pee MD   Signed by:   Roderick Pee MD on 06/13/2010   Method used:   Electronically to        [facility]  [phone]* (retail)       [address] [place], [place]  [address]       Ph: [phone] or [phone]       Fax: [phone]   RxID:   [id number] PREVACID 30 MG  CPDR (LANSOPRAZOLE) Take 1 capsule by mouth once a day  #100 x 3   Entered and Authorized by:   Roderick Pee MD   Signed by:   Roderick Pee MD on 06/13/2010   Method used:   Electronically to        [facility]  #  [address]* (retail)       [address] [place], [place]  [address]       Ph: [phone] or [phone]       Fax: [phone]   RxID:   [id number]     Laboratory Results   Urine Tests    Routine Urinalysis   Color: yellow Appearance: Clear Glucose: negative   (Normal Range: Negative) Bilirubin: negative   (Normal Range: Negative) Ketone: trace (5)   (Normal Range:  Negative) Spec. Gravity: 1.020   (Normal Range: 1.003-1.035) Blood: negative   (Normal Range: Negative) pH: 7.5   (Normal Range: 5.0-8.0) Protein: trace   (Normal Range: Negative) Urobilinogen: 2.0   (Normal Range: 0-1) Nitrite: negative   (Normal Range: Negative) Leukocyte Esterace: negative   (Normal Range: Negative)    Comments: Rita Ohara  June 13, 2010 11:22 AM

## 2011-02-14 NOTE — Consult Note (Signed)
NAMETAE, VONADA NO.:  [id number]   MEDICAL RECORD NO.:  [id number]          PATIENT TYPE:  INP   LOCATION:  2041                         FACILITY:  [facility]   PHYSICIAN:  Ollen Gross. Vernell Morgans, M.D. DATE OF BIRTH:  09/09/1929   DATE OF CONSULTATION:  09/20/2004  DATE OF DISCHARGE:                                   CONSULTATION   [name] is a 77 year old white male who presented yesterday with  epigastric pain that started yesterday morning that was associated with  nausea and vomiting.  He denied any fevers.  His bowels move regularly,  about ever third day, and this has not changed.  Since coming to the  emergency department, his pain has resolved somewhat.  He initially  underwent some cardiac workup which was negative.  He then underwent  ultrasound and CT scan that showed stones in his gallbladder and some  dilated biliary ducts.  His LFTs, though, were normal.  The rest of his  Review of Systems was unremarkable.  He does walk about 5 to 6 miles a day,  tolerates this well.  He has had no chest pain or shortness of breath.   PAST MEDICAL HISTORY:  1.  Exercise-induced reflux from a hiatal hernia.  2.  Hypertension.  3.  BPH.  4.  Gastroesophageal reflux.   PAST SURGICAL HISTORY:  None.   MEDICATIONS:  Includes Advair, Prevacid, Catapres, Lipitor, Maxzide, Flomax,  aspirin, BuSpar, Prozac.   ALLERGIES:  EGGS.   SOCIAL HISTORY:  Denies use of alcohol or tobacco products.   FAMILY HISTORY:  Noncontributory.   PHYSICAL EXAMINATION:  GENERAL:  Well-developed, well-nourished white male  in no acute distress.  SKIN:  Warm and dry with no jaundice.  EYES:  Extraocular muscles intact.  Pupils equal, round, and reactive to  light.  Sclerae nonicteric.  LUNGS: Clear bilaterally with no use of accessory respiratory muscles.  HEART:  Regular rate and rhythm with impulse in left chest.  ABDOMEN: Soft and nontender with no palpable mass or  hepatosplenomegaly.  EXTREMITIES:  No cyanosis, clubbing, or edema.  Good strength in arms and  legs.  PSYCHOLOGIC:  Alert and oriented x 3 with no evidence of anxiety or  depression.   LABORATORY DATA AND OTHER STUDIES:  On review of his lab work, his white  count was slightly elevated at about 15,000.  LFTs were normal.   Ultrasound and CT scan were reviewed and were as described above.   IMPRESSION AND PLAN:  This is a 77 year old white male with epigastric pain,  probably related to his gallstones.  He also has some dilated common bile  ducts, but his liver function tests are normal.  Since his liver functions  tests were normal, I feel it would be reasonable to go ahead with  cholecystectomy to remove the gallbladder as I think his pain is related to  this.  I have discussed with him in detail the risks and benefits of the  operation as well as technical aspects.  He understands and wishes to  proceed.  We will obtain an intraoperative cholangiogram, and if  this shows  anything, then we will need GI to evaluate him for possible ERCP procedure.  He understands this as well.  We will continue making him n.p.o. and  schedule him for surgery either tonight or tomorrow.      Peter Wolfe   PST/MEDQ  D:  09/20/2004  T:  09/22/2004  Job:  [id number]

## 2011-02-14 NOTE — Consult Note (Signed)
Peter Wolfe, Peter Wolfe NO.:  [id number]   MEDICAL RECORD NO.:  [id number]          PATIENT TYPE:  INP   LOCATION:  2041                         FACILITY:  [facility]   PHYSICIAN:  Iva Boop, M.D. LHCDATE OF BIRTH:  08-20-1929   DATE OF CONSULTATION:  09/20/2004  DATE OF DISCHARGE:                                   CONSULTATION   REFERRING PHYSICIANS:  Dr. Eleonore Chiquito.  Dr. Rene Paci.  Dr.  Rushie Goltz.   REASON FOR CONSULTATION:  Gallstones, dilated common duct.   HISTORY:  This is a pleasant 77 year old white man who has been fairly  healthy overall.  He had several hours intense epigastric pain radiating to  the chest.  CT of the abdomen was performed that showed gallstones and a  dilated common bile duct, it measures up to about 10 millimeters on my  analysis on the __________ it is dilated to the ampulla without evidence of  stone or stricture, and the intrahepatics are described as dilated, although  I do not see that as well.  He has a right psoas density and a hernia  expanding bladder on the right inguinal side.  Ultrasound was subsequently  done which shows a 7.4 millimeter common bile duct with sludge and stones in  the gallbladder and at least one stone in the neck of the gallbladder.  He  has not had vomiting at this time, although he has had some nausea, he had  an episode of nausea and vomiting and pain in January, he had gallstones on  an ultrasound by Dr. Maryagnes Amos, did not think his symptoms were from the  gallbladder at the time.  He has had three pneumonias in several years, he  has some reflux problems treated with Protonix or another PPI and feels that  that has helped.  He does not describe obvious oropharyngeal dysphagia or  esophageal dysphagia.   MEDICATIONS:  1.  Unasyn.  2.  Aspirin 325 mg.  3.  BuSpar.  4.  Catapres.  5.  Prozac.  6.  Protonix.   DRUG ALLERGIES:  NO DRUG ALLERGIES.  ALLERGIC TO EGGS.   PAST  MEDICAL HISTORY:  1.  Reflux disease.  2.  Anxiety.  3.  Insomnia.  4.  COPD.  5.  Asthma.  6.  Benign prostatic hypertrophy.   FAMILY HISTORY:  Sister has had gallbladder problems.  No colon cancer.   SOCIAL HISTORY:  No smoking, no alcohol.  He is single.  He is retired  Hotel manager.  His aunt is 80 years old and he is her caretaker.  She is being  placed at this time in a nursing home and there are multiple issues to deal  with there.   REVIEW OF SYSTEMS:  He denies fever.  He is otherwise physically active.  He  is anxious and stressed.  He feels that he has an early cataract.  He has  insomnia problems with trazodone.  He has gained 20 pounds in the last 6  months.  All other systems are negative.   PHYSICAL EXAMINATION:  Reveals a pleasant elderly white man  looking younger  than his stated age.  Temperature 99, blood pressure 148/80, pulse 78,  respirations 20.  EYES:  Anicteric.  MOUTH:  Posterior pharynx free of lesions.  NECK:  Supple, no thyromegaly or mass.  CHEST:  Clear.  HEART:  S1, S2, no gallops.  ABDOMEN:  Soft, mild epigastric and right upper quadrant tenderness without  organomegaly or mass.  EXTREMITIES:  No edema.  He appears somewhat anxious.  LYMPH NODES:  No neck or supraclavicular nodes.  SKIN:  Free of rash.  He is warm, although no febrile at this time.   LABS:  LFTs are normal at this point.  Hemoglobin 13.9, hematocrit 39, white  count 15.9, BMET normal except for glucose 138, Nonfasting.  Albumin is 3.9.  Coags are normal.  X-rays as above, have been self-viewed.   ASSESSMENT:  1.  Cholelithiasis that I think is the cause of his problems.  2.  Dilated biliary bile duct on CT, but not on ultrasound.  I think that      they may be seeing his cystic duct, he may have a low cystic duct.  Dr.      Carolynne Edouard has seen the patient and has planned for a cholecystectomy with      intraoperative cholangiogram.   PLAN:  I agree with cholecystectomy and  intraoperative cholangiogram.  If  there are unexplained issues after the intraoperative cholangiogram, i.e.  stones versus stricture or other cause of dilation, then we could proceed  with an endoscopic retrograde cholangiopancreatogram.  I will be available.  I think it is pretty unlikely he has a retained stone with normal liver  function tests, although certainly you cannot rule that out.   I appreciate the opportunity to care for this patient.   ADDENDUM:  Given his reflux problems, if he has not had a barium swallow,  and he may even need a modified barium swallow to see if he is having some  aspiration, I would do that and probable do an upper gastrointestinal series  at some point.  An upper gastrointestinal endoscopy might be indicated, but  that is not necessarily so.  I do not have access to his x-ray records where  I am at this point.  We will defer to Dr. Tawanna Cooler on that.       CEG/MEDQ  D:  09/20/2004  T:  09/22/2004  Job:  [id number]   cc:   Tinnie Gens A. Tawanna Cooler, M.D. [facility]   Rushie Goltz, MD

## 2011-02-14 NOTE — H&P (Signed)
NAME:  Peter Wolfe, Peter Wolfe                     ACCOUNT NO.:  [id number]   MEDICAL RECORD NO.:  [id number]                   PATIENT TYPE:  INP   LOCATION:  0345                                 FACILITY:  [facility]   PHYSICIAN:  Eugenio Hoes. Tawanna Cooler, M.D. [facility]           DATE OF BIRTH:  02/07/1929   DATE OF ADMISSION:  10/18/2003  DATE OF DISCHARGE:                                HISTORY & PHYSICAL   TIME:  3:30 a.m.   This is the first [facility] admission for this 77 year old  single male who is admitted to the hospital following a syncopal episode and  a fall at home.   The patient felt well until October 17, 2003, when he awoke with nausea and  vomiting.  He was seen in the emergency room by Dr. Doug Sou.  At that  time, exam was negative.  Diagnostic studies which were numerous were all  negative, and he was sent home on clear liquids.  He did have 30,000 white  count with a slight left shift but no obvious source of any bacterial  infection.  He was referred to the office for reevaluation. He was seen on  October 17, 2003, in the afternoon and was well.  Examination at that time  was normal, and he had no complaints.  He was asked to rest at home, drinks  lots of liquids, hold his diuretics, and follow up p.r.n.  He says he went  home, felt fine, slept most of the day, got up around midnight to urinate,  and got halfway to the bathroom and got real lightheaded.  He then instead  of trying to go back to bed or sit down, he kept walking, and he fell and  hit his head.  He states he was not knocked unconscious.  He got up, looked  at his noggin, he had a big bruise, and got in the car and drove himself to  [facility] emergency room.  In [facility] emergency room, he was seen by  the EDP who did a complete evaluation again including a CT brain scan all of  which was normal.  Because this is the second trip to the emergency room in  24 hours and because he had this  syncopal episode, I was called for eval and  admission.   PAST MEDICAL HISTORY:  Never been in the hospital overnight for anything.   PAST ILLNESSES:  None.   INJURIES:  None.   DRUG ALLERGIES:  None.   He does not smoke or drink any alcohol except for an occasional drink.   MEDICATION HISTORY:  He is on:  1. Maxzide 50-25.  2. Aspirin 1 q.d.  3. Clonidine 0.1 b.i.d.  4. Prevacid 30 q.d.  5. Ambien q.h.s. p.r.n. for sleep.  6. Advair 100-50, 1 puff b.i.d.  7. Flomax 0.4 q.d.  8. Prozac 10 q.d.  9. Lipitor 10 q.h.s.   REVIEW OF  SYMPTOMS:  HEENT:  Negative.  He does have upper and lower  dentures.  CARDIOPULMONARY:  Negative.  GI:  Has had GERD control with  Prilosec or Prevacid.  He has never had any urinary tract problems except  for some BPH; that is why he is on the Flomax.  The rest of review of  systems negative.   SOCIAL HISTORY:  He is single.  Enjoys reading.  He prays at the [employer] six to eight hours per day.  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  He is Occupational hygienist.  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   VACCINATION HISTORY:  He is up on all his vaccinations having had a tetanus  in 2002 and Pneumovax in 2002.  Never had a flu shot because he says he has  some egg allergy.   PHYSICAL EVALUATION:  HEIGHT:  5 feet 7 inches.  WEIGHT:  145.  TEMP:  98.  PULSE:  70 and regular.  RESPIRATIONS:  12 and regular.  GENERAL:  He is a well-developed, well-nourished white male with an orange-  sized hematoma over his left forehead.  He was alert and oriented.  He was a  little fuzzy about the events but had no memory loss.  HEAD/EYES/EARS/NOSE & THROAT:  Negative except the hematoma as noted above.  NECK:  Supple.  Thyroid was not enlarged.  No carotid bruits.  CHEST:  Clear to auscultation.  CARDIAC:  Negative.  ABDOMEN:   Negative.  EXTREMITIES:  Normal skin.  Normal peripheral pulses.   LABORATORY DATA:  A CBC with a white count down to 14,000.  Electrolytes  were normal.  He did slightly tilt when he stood up.  His pressure dropped  to 110 from 120.   A CT brain scan normal as noted above.   IMPRESSION:  1. Syncopal episode with left frontal hematoma.  2. History of hypertension.  3. History of reflux.  4. History of asthma.  5. History of benign prostatic hypertrophy.  6. History of mild depression.  7. History of hyperlipidemia.   PLAN:  Admit to cardiac monitor because he had the syncopal episode for 24  hours.  If he is stable and has no further syncope and rhythm stays normal,  then it is probably just a volume depletion from all the vomiting.  We will  maintain ice to the forehead and discharge on Thursday morning if he  continues to be stable.                                               Jeffrey A. Tawanna Cooler, M.D. [facility]   JAT/MEDQ  D:  10/18/2003  T:  10/18/2003  Job:  [id number]

## 2011-02-14 NOTE — Discharge Summary (Signed)
NAME:  Peter Wolfe, Peter Wolfe                     ACCOUNT NO.:  [id number]   MEDICAL RECORD NO.:  [id number]                   PATIENT TYPE:  INP   LOCATION:  0345                                 FACILITY:  [facility]   PHYSICIAN:  Rene Paci, M.D. [facility]          DATE OF BIRTH:  10-23-1928   DATE OF ADMISSION:  10/18/2003  DATE OF DISCHARGE:  10/19/2003                                 DISCHARGE SUMMARY   DISCHARGE DIAGNOSES:  1. Syncope with traumatic fall, left forehead hematoma. Question Battle sign     with racoon eye bruising. Computer tomography scan of head negative.     Cardiac workup negative.  2. Fever with leukocytosis, resolved without antibiotic therapy. Question     viral gastroenteritis versus upper respiratory infection versus     gallstones. Abdominal ultrasound negative for cholecystitis. Positive     sludge and stones without gallbladder wall thickening.  3. Hypokalemia secondary to vomiting, replaced.  4. History of hypertension.  5. History of benign prostatic hypertrophy.  6. History of hypercholesterolemia.  7. History of depression.   DISCHARGE MEDICATIONS:  As prior to admission without change. These include:  1. Maxzide 50/25 p.o. daily.  2. Aspirin 325 mg p.o. daily.  3. Clonidine 0.1 b.i.d.  4. Prevacid 30 mg daily.  5. Ambien 15 mg q.h.s. p.r.n.  6. Advair 100/50 inhalation b.i.d.  7. Flomax 0.4 mg p.o. daily.  8. Prozac 10 mg p.o. daily.  9. Lipitor 10 mg p.o. q.h.s.   DISPOSITION:  The patient is discharged to home where he provides care to a  63 year old aunt. The symptoms have resolved. 24-hour telemetry negative for  any arrhythmias. Cardiac enzymes negative.   CONDITION ON DISCHARGE:  Medically stable.   HOSPITAL FOLLOWUP:  Primary care physician, Eugenio Hoes. Tawanna Cooler, M.D. [facility], in  one to two weeks as needed. The patient is instructed to return to the  emergency room for persisting headache, visual changes, or other problems.   BRIEF HOSPITAL  COURSE:  Syncope with fall. The patient is a pleasant 77-year-  old gentleman who reports having episodic diarrhea last week and then began  vomiting two days prior to admission.  He had a slight temperature at home  with this on the morning of admission with vomiting in the bathroom when he  became dizzy and apparently passed out. He suffered a blow to his left  forehead and because of the headache and feeling weak he contacted his  primary care physician who prompted him for evaluation in the emergency room  where he was admitted for evaluation of syncope. Initial white count was  30.4, but resolved to normal level at 9.2 without intervention. He was  treated with IV fluids for possible dehydration as he was initially  orthostatic, but the patient had no further GI symptoms, URI complaints, or  abdominal pain. Because of the nausea and vomiting with a mild increase in  total bilirubin, an abdominal ultrasound was obtained  to rule out  cholecystitis. The patient did have evidence of stones and sludge, but no  wall thickening. He had initial temperature spike on admission. The patient  remained afebrile through the rest of hospitalization without antibiotics or  treatment. Urine was negative as was a chest x-ray. No other source of  infection could be identified. CT of the head was performed secondary to the  patient's head injury and this was negative for any skull fractures. The  patient did have extension of the bruising during 24 hours of observation  spreading from his left forehead to involve ecchymosis around the eyes, both  upper and lower lids bilaterally.  The patient is anxious to go home  to  provide care for his aunt. This is discussed with his primary care physician  and he is felt stable for discharge home. All other medications are as prior  to admission.  Please see H&P for further details.                                               Rene Paci, M.D. [facility]     VL/MEDQ  D:  10/19/2003  T:  10/20/2003  Job:  [id number]

## 2011-02-14 NOTE — H&P (Signed)
NAMEJALEEN, FINCH NO.:  [id number]   MEDICAL RECORD NO.:  [id number]          PATIENT TYPE:  INP   LOCATION:  2041                         FACILITY:  [facility]   PHYSICIAN:  Gordy Savers, M.D. LHCDATE OF BIRTH:  [birth year]   DATE OF ADMISSION:  09/19/2004  DATE OF DISCHARGE:                                HISTORY & PHYSICAL   CHIEF COMPLAINT:  Chest pain.   HISTORY OF PRESENT ILLNESS:  The patient is a 77 year old gentleman who was  stable until the day of admission when he awoke with chest pain. This was  described as a deep achiness in the mid chest area and has persisted for  several hours. There has been no aggravating or alleviating factors, but has  been associated with some nausea. No diaphoresis or shortness of breath. He  has had constant pain that has not been affected by activities or eating. He  presented to the office complaining of persistent pain. An electrocardiogram  was obtained and was normal. Nitroglycerin was administered sublingually  without effect. During a period of observation he continued to have  persistent pain and became much more nauseated and the patient is now  admitted for further evaluation and treatment.   PAST MEDICAL HISTORY:  The patient has no known coronary artery disease. He  did have an exercise stress test done in May 2003 that was negative. He does  have a long history of gastroesophageal reflux disease and a chronic anxiety  disorder. He has chronic insomnia. Other diagnoses includes hypertension and  hypercholesterolemia. Also a history of COPD with asthma and a history of  BPH.   REVIEW OF SYSTEMS:  Otherwise fairly noncontributory. He states that his  pain is qualitatively different from his prior dyspepsia. He did have a  screening __________ O2.   MEDICAL REGIMEN:  1.  Advair 100/50 one puff b.i.d.  2.  Prevacid 30 mg daily.  3.  Catapres 0.1 mg daily.  4.  Lipitor 10 mg daily.  5.  Maxzide 75 mg  one daily.  6.  Flomax 0.4 mg daily.  7.  Aspirin 325 mg daily.  8.  BuSpar 10 mg b.i.d.  9.  Prozac 40 mg daily.   SOCIAL HISTORY:  He lives alone. He is a nondrinker and nonsmoker. He is  retired, but works daily at [employer]. He has been under much stress  lately due to the health of an aunt for whom he has power-of-attorney and  has much responsibilities with her present deteriorating clinical status.  She is presently residing in the [facility].   FAMILY HISTORY:  Positive for cardiac and cerebrovascular disease.   PHYSICAL EXAMINATION:  GENERAL: A well-developed, well-nourished male who is  anxious, but in no acute distress.  VITAL SIGNS: Blood pressure 120/72. Pulse 64.  SKIN: Warm and dry.  HEENT/NECK: Normal fundi. Ears, nose, and throat clear. Oropharynx clear.  Neck reveals no adenopathy or bruits.  CHEST: Clear. No chest wall pain.  CARDIOVASCULAR: S1 and S2. No murmurs.  ABDOMEN: Soft and nontender. No organomegaly or masses. Bowel sounds are  active.  EXTREMITIES: Negative without edema.  NEUROLOGIC: Nonfocal.   IMPRESSION:  1.  Atypical chest pain syndrome. Rule out coronary artery disease.  2.  History of gastroesophageal reflux disease.  3.  Hypertension, controlled.   DISPOSITION:  The patient will be admitted to a telemetry setting, serial  cardiac enzymes will be monitored. A follow-up EKG will be reviewed in the  morning. Depending on his clinical status, we will consider for an  outpatient Cardiolite stress test if he is clinically stable.       PFK/MEDQ  D:  09/19/2004  T:  09/20/2004  Job:  [id number]

## 2011-02-14 NOTE — Assessment & Plan Note (Signed)
[facility]                                 ON-CALL NOTE   GIOVANNIE, SCERBO                    MRN:          [id number]  DATE:10/31/2006                            DOB:          March 07, 1929    TELEPHONE CONSULTATION:   TIME OF INTERACTION:  Was 10:15 a.m.   OBJECTIVE:  The patient is congested, would like to be seen, was given  an appointment at 12 noon to be seen in the [facility].   PRIMARY CARE Martavia Tye:  Unknown.     Arta Silence, MD  Electronically Signed    RNS/MedQ  DD: 10/31/2006  DT: 10/31/2006  Job #: [id number]

## 2011-02-14 NOTE — Op Note (Signed)
NAMEDAIL, LEREW NO.:  [id number]   MEDICAL RECORD NO.:  [id number]          PATIENT TYPE:  INP   LOCATION:  2041                         FACILITY:  [facility]   PHYSICIAN:  Ollen Gross. Vernell Morgans, M.D. DATE OF BIRTH:  10/13/1928   DATE OF PROCEDURE:  09/21/2004  DATE OF DISCHARGE:  09/23/2004                                 OPERATIVE REPORT   PREOPERATIVE DIAGNOSIS:  Cholecystitis with cholelithiasis.   POSTOPERATIVE DIAGNOSIS:  Cholecystitis with cholelithiasis.   PROCEDURE:  Laparoscopic cholecystectomy with intraoperative cholangiogram.   ASSISTANT:  Leonie Man, M.D.   ANESTHESIA:  General endotracheal.   DESCRIPTION OF PROCEDURE:  After informed consent was obtained, the patient  was brought to the operating room, placed in the supine position on the  operating room table.  After adequate induction of general anesthesia, the  patient's abdomen was prepped with Betadine, draped in usual sterile manner.  The area below the umbilicus was infiltrated with 0.25% Marcaine.  A small  incision was made with a 15 blade knife.  This incision was carried down  through the subcutaneous tissue bluntly with Kelly clamp and [employer]  retractors until linea alba was identified.  Linea alba was incised with a  #15 blade knife and each side was grasped with Kocher clamps and elevated  anteriorly.  The preperitoneal space was then probed bluntly with the  hemostat until the peritoneum was opened and access was gained to the  abdominal cavity.  A 0 Vicryl pursestring stitch was placed in the fascia  surrounding the opening.  A Hasson cannula was placed through the opening  and anchored in place with the Vicryl pursestring stitch.  The abdomen was  insufflated with carbon dioxide without difficulty.  The patient was placed  in the head up position and rotated slightly to the right side up.  A  laparoscope was placed through the Hasson cannula.  Right upper quadrant was  inspected.  The dome of the gallbladder and liver were readily identified.  Next, the epigastric region was infiltrated with 0.25% Marcaine and a small  incision was made with a 15 blade knife and a 10-mm port was placed bluntly  through this incision in the abdominal cavity under direct vision.  Next,  sites were chosen on the right side of the abdomen for placement of 5-mm  ports.  Each of these areas were infiltrated with 0.25% Marcaine.  Small  stab incisions were made with a #15 blade knife and 5-mm ports were placed  bluntly through these incisions into the abdominal cavity under direct  vision.  The gallbladder was very tense and inflamed.  A suction aspirating  device was placed through the epigastric port.  The dome of the gallbladder  was punctured and aspirated.  This allowed a blunt grasper to be placed  through the lateral-most 5-mm port and used to grasp the dome of the  gallbladder and elevated anteriorly and superiorly.  Another blunt grasper  was placed through the other 5-mm port and used to retract on the body and  neck of the bladder.  A dissector was placed through the  epigastric port  and, using electrocautery, the peritoneal reflection at the gallbladder neck  was opened.  Blunt dissection was then carried out in this area until the  gallbladder neck and cystic duct junction was readily identified and a good  window was created.  The cystic duct appeared to be very dilated and a clip  would not fit across it.  A small ductotomy was made at the junction between  the cystic duct and gallbladder with the laparoscopic scissors.  A 14 gauge  NG catheter was then placed percutaneously through the anterior abdominal  wall under direct vision.  A Reddick cholangiogram catheter was placed  through the NG catheter and flushed.  The Reddick catheter was then placed  within the cystic duct and anchored in place with a clip and the balloon was  inflated.  A cholangiogram was  obtained and it showed no filling defects,  although the common duct system was very dilated and rapid emptying into the  duodenum and adequate length on the cystic duct.  The anchoring clip and  catheter were then removed from the patient.  Because a clip would not fit  across the duct, the 10-mm port in the epigastric region was exchanged for a  12-mm port and a laparoscopic stapling device with a blue load was placed  through the 12-mm port and across the cystic duct, just at the edge of the  ductotomy.  The stapling device was then clamped and fired, thereby dividing  the cystic duct between staple lines.  Next, the laparoscopic hook cautery  device was used to separate the gallbladder from the liver bed.  Prior to  completely detaching the gallbladder from the liver bed, the liver bed was  inspected and several small bleeding points were coagulated with the  electrocautery.  The gallbladder was very inflamed.  Once the gallbladder  had been separated from the liver bed, a laparoscopic bag was placed through  the epigastric port, the gallbladder was placed within the bag, and the bag  was sealed.  The abdomen was then irrigated with copious amounts of saline.  An alligator grasper was then placed through the lateral-most 5-mm port and  through the 12-mm epigastric port.  It was used to grasp a 19-French round  Blake drain and the drain was brought into the abdomen and the end of the  drain was brought out through the lateral-most port site.  The drain was  then placed in the liver bed where the gallbladder had sat and was in good  position.  The drain was anchored to the skin with a 3-0 nylon stitch.  The  rest of the ports were removed under direct vision and were found to be  hemostatic.  Gas was allowed to escape.  The skin incisions were closed with  interrupted 4-0 Monocryl subcuticular stitches.  The fascial defect was closed at the infraumbilical port with two figure-of-eight 0  Vicryl  stitches.  The skin was closed with interrupted 4-0 Monocryl subcuticular  stitch.  Benzoin, Steri-Strips and sterile dressings were applied.  The  patient tolerated the procedure well.  At the end of the case, all needle,  sponge and instrument counts were correct.  The patient was then awakened  and taken to recovery room in stable condition.      Renae Fickle   PST/MEDQ  D:  09/25/2004  T:  09/25/2004  Job:  [id number]

## 2011-02-14 NOTE — Discharge Summary (Signed)
NAMEARCHIMEDES, Peter Wolfe           ACCOUNT NO.:  [id number]   MEDICAL RECORD NO.:  [id number]          PATIENT TYPE:  INP   LOCATION:  2041                         FACILITY:  [facility]   PHYSICIAN:  Rene Paci, M.D. LHCDATE OF BIRTH:  [birth year]   DATE OF ADMISSION:  09/19/2004  DATE OF DISCHARGE:  09/23/2004                                 DISCHARGE SUMMARY   DISCHARGE DIAGNOSES:  1.  Abdominal pain.  2.  Cholelithiasis.  3.  Status post laparoscopic cholecystectomy.   BRIEF ADMISSION HISTORY:  Peter Wolfe is a 77 year old white male who  presented with abdominal pain and nausea.   PAST MEDICAL HISTORY:  1.  Negative exercise treadmill test in May, 2003.  2.  Gastroesophageal reflux disease.  3.  Anxiety.  4.  COPD, asthma.  5.  Hypercholesterolemia.  6.  Hypertension.  7.  Chronic insomnia.  8.  BPH.   HOSPITAL COURSE:  1.  GI.  The patient presented with abdominal pain.  CT reveals angularly      obstruction with biliary dilatation.  Abdominal ultrasound did not      reveal acute cholecystitis but did reveal stones and sludge.  Patient's      LFTs were elevated and consistent with a possible choledocholithiasis.      The patient was seen in consultation by both GI and surgery.  It was      felt that cholelithiasis was his main problem.  Therefore, surgery did      proceed with lap chole with __________ that was negative.  The patient's      postop course has been uncomplicated except for some urinary retention.      The patient required in-and-out cath x2, but otherwise currently voiding      __________.   1.  Hypokalemia.  This has been replaced.   LABORATORIES AT DISCHARGE:  Potassium 3, AST 71, ALT 113, total bilirubin  3.1 and trending down.   MEDICATIONS AT DISCHARGE:  Vicodin, Advair 100/50 1 puff b.i.d., Prevacid 30  mg daily, Catapres 0.1 mg daily, Lipitor 10 mg daily, Maxzide 75/50 daily,  Flomax 0.4 mg daily, aspirin 325 mg daily, BuSpar 10 mg q.i.d.,  Prozac 20 mg  daily.   FOLLOW UP:  With Dr. Carolynne Edouard in 2 weeks and followup with Dr. Tawanna Cooler in 1-2  weeks.      Laur   LC/MEDQ  D:  09/23/2004  T:  09/23/2004  Job:  [id number]   cc:   Tinnie Gens A. Tawanna Cooler, M.D. [facility]   Ollen Gross. Vernell Morgans, M.D.  [address]  [place]  [place] [address]  Fax: [phone]   Iva Boop, M.D. [facility]

## 2011-06-11 ENCOUNTER — Ambulatory Visit (INDEPENDENT_AMBULATORY_CARE_PROVIDER_SITE_OTHER): Payer: Medicare Other | Admitting: Family Medicine

## 2011-06-11 ENCOUNTER — Encounter: Payer: Self-pay | Admitting: Family Medicine

## 2011-06-11 DIAGNOSIS — G4700 Insomnia, unspecified: Secondary | ICD-10-CM

## 2011-06-11 DIAGNOSIS — L509 Urticaria, unspecified: Secondary | ICD-10-CM

## 2011-06-11 DIAGNOSIS — J449 Chronic obstructive pulmonary disease, unspecified: Secondary | ICD-10-CM

## 2011-06-11 DIAGNOSIS — E785 Hyperlipidemia, unspecified: Secondary | ICD-10-CM

## 2011-06-11 DIAGNOSIS — Z23 Encounter for immunization: Secondary | ICD-10-CM

## 2011-06-11 DIAGNOSIS — L259 Unspecified contact dermatitis, unspecified cause: Secondary | ICD-10-CM

## 2011-06-11 DIAGNOSIS — Z0000 Encounter for general adult medical examination without abnormal findings: Secondary | ICD-10-CM

## 2011-06-11 DIAGNOSIS — K219 Gastro-esophageal reflux disease without esophagitis: Secondary | ICD-10-CM

## 2011-06-11 DIAGNOSIS — F410 Panic disorder [episodic paroxysmal anxiety] without agoraphobia: Secondary | ICD-10-CM

## 2011-06-11 LAB — BASIC METABOLIC PANEL
BUN: 12 mg/dL (ref 6–23)
CO2: 32 mEq/L (ref 19–32)
Calcium: 9.3 mg/dL (ref 8.4–10.5)
Chloride: 95 mEq/L — ABNORMAL LOW (ref 96–112)
Creatinine, Ser: 0.9 mg/dL (ref 0.4–1.5)
GFR: 86.23 mL/min (ref >60.00)
Glucose, Bld: 87 mg/dL (ref 70–99)
Potassium: 3.6 mEq/L (ref 3.5–5.1)
Sodium: 136 mEq/L (ref 135–145)

## 2011-06-11 LAB — HEPATIC FUNCTION PANEL
ALT: 15 U/L (ref 0–53)
AST: 24 U/L (ref 0–37)
Albumin: 4.3 g/dL (ref 3.5–5.2)
Alkaline Phosphatase: 47 U/L (ref 39–117)
Bilirubin, Direct: 0.2 mg/dL (ref 0.0–0.3)
Total Protein: 7.5 g/dL (ref 6.0–8.3)

## 2011-06-11 LAB — LIPID PANEL
HDL: 62.3 mg/dL (ref >39.00)
Total CHOL/HDL Ratio: 3
Triglycerides: 81 mg/dL (ref 0.0–149.0)

## 2011-06-11 LAB — POCT URINALYSIS DIPSTICK
Bilirubin, UA: NEGATIVE
Blood, UA: NEGATIVE
Glucose, UA: NEGATIVE
Ketones, UA: NEGATIVE
Nitrite, UA: NEGATIVE
Protein, UA: NEGATIVE
Spec Grav, UA: 1.015
pH, UA: 8.5

## 2011-06-11 LAB — CBC WITH DIFFERENTIAL/PLATELET
Basophils Relative: 0.2 % (ref 0.0–3.0)
Eosinophils Absolute: 0.1 10*3/uL (ref 0.0–0.7)
HCT: 41.3 % (ref 39.0–52.0)
Hemoglobin: 14.4 g/dL (ref 13.0–17.0)
Lymphocytes Relative: 24.2 % (ref 12.0–46.0)
Lymphs Abs: 1.3 10*3/uL (ref 0.7–4.0)
MCHC: 34.8 g/dL (ref 30.0–36.0)
MCV: 100.1 fl — ABNORMAL HIGH (ref 78.0–100.0)
Neutro Abs: 3.6 10*3/uL (ref 1.4–7.7)
RBC: 4.13 Mil/uL — ABNORMAL LOW (ref 4.22–5.81)

## 2011-06-11 LAB — LDL CHOLESTEROL, DIRECT: Direct LDL: 129.6 mg/dL

## 2011-06-11 MED ORDER — FLUOXETINE HCL 20 MG PO CAPS: 20.0000 mg | ORAL_CAPSULE | Freq: Every day | ORAL | Status: DC

## 2011-06-11 MED ORDER — BUSPIRONE HCL 30 MG PO TABS: 30.0000 mg | ORAL_TABLET | Freq: Two times a day (BID) | ORAL | Status: DC

## 2011-06-11 MED ORDER — TAMSULOSIN HCL 0.4 MG PO CAPS: 0.4000 mg | ORAL_CAPSULE | Freq: Every day | ORAL | Status: DC

## 2011-06-11 MED ORDER — TRAZODONE HCL 100 MG PO TABS: 100.0000 mg | ORAL_TABLET | Freq: Every day | ORAL | Status: DC

## 2011-06-11 MED ORDER — SIMVASTATIN 10 MG PO TABS: 10.0000 mg | ORAL_TABLET | Freq: Every day | ORAL | Status: DC

## 2011-06-11 MED ORDER — CLONIDINE HCL 0.1 MG PO TABS: 0.1000 mg | ORAL_TABLET | Freq: Every day | ORAL | Status: DC

## 2011-06-11 MED ORDER — LANSOPRAZOLE 30 MG PO CPDR: 30.0000 mg | DELAYED_RELEASE_CAPSULE | Freq: Every day | ORAL | Status: DC

## 2011-06-11 MED ORDER — TRIAMTERENE-HCTZ 75-50 MG PO TABS: 1.0000 | ORAL_TABLET | Freq: Every day | ORAL | Status: DC

## 2011-06-11 NOTE — Progress Notes (Signed)
  Subjective:    Patient ID: Peter Wolfe, male    DOB: 06-Apr-1929, 77 y.o.   MRN: [id number]  HPI Peter Wolfe is a 77 year old, single male, nonsmoker comes in today for Medicare wellness examination because of a history of insomnia, depression, hypertension, BPH, with outlet obstruction, reflux, esophagitis, hyperlipidemia.  His medications were reviewed in detail and have been no changes.  He states he feels well except T7 intermittent episodes of urticaria.  He started over-the-counter antihistamines, but they don't help.  We put him on a short course of prednisone in the past and that would help he would like a new prescription.  He gets routine eye care, hearing normal, regular dental care, activities of daily living.  Normal.  He walks on a regular basis.  Cognitive function, normal, home health safety reviewed.  No issues identified, no guns in the house, he does have a healthcare power of attorney, and a living will.  Tetanus 2002......... Booster today......... Pneumovax 2007, shingles 2009, unable to give flu shot because he had a history of egg allergies.   Review of Systems  Constitutional: Negative.   HENT: Negative.   Eyes: Negative.   Respiratory: Negative.   Cardiovascular: Negative.   Gastrointestinal: Negative.   Genitourinary: Negative.   Musculoskeletal: Negative.   Skin: Negative.   Neurological: Negative.   Hematological: Negative.   Psychiatric/Behavioral: Negative.        Objective:   Physical Exam  Constitutional: He is oriented to person, place, and time. He appears well-developed and well-nourished.  HENT:  Head: Normocephalic and atraumatic.  Right Ear: External ear normal.  Left Ear: External ear normal.  Nose: Nose normal.  Mouth/Throat: Oropharynx is clear and moist.  Eyes: Conjunctivae and EOM are normal. Pupils are equal, round, and reactive to light.  Neck: Normal range of motion. Neck supple. No JVD present. No tracheal deviation present. No  thyromegaly present.  Cardiovascular: Normal rate, regular rhythm, normal heart sounds and intact distal pulses.  Exam reveals no gallop and no friction rub.   No murmur heard. Pulmonary/Chest: Effort normal and breath sounds normal. No stridor. No respiratory distress. He has no wheezes. He has no rales. He exhibits no tenderness.  Abdominal: Soft. Bowel sounds are normal. He exhibits no distension and no mass. There is no tenderness. There is no rebound and no guarding.  Genitourinary: Rectum normal, prostate normal and penis normal. Guaiac negative stool. No penile tenderness.  Musculoskeletal: Normal range of motion. He exhibits no edema and no tenderness.  Lymphadenopathy:    He has no cervical adenopathy.  Neurological: He is alert and oriented to person, place, and time. He has normal reflexes. No cranial nerve deficit. He exhibits normal muscle tone.  Skin: Skin is warm and dry. No rash noted. No erythema. No pallor.  Psychiatric: He has a normal mood and affect. His behavior is normal. Judgment and thought content normal.          Assessment & Plan:  Chronic and some needed.  Continue current medications.  Hypertension.  Continue current medications.  BPH.  Continue Flomax, Monday, Wednesday, Friday.  Reflux esophagitis.  Continue current medications  Depression.  Continue current medications  Hyperlipidemia.  Continue simvastatin 10 nightly along with an aspirin tablet daily.

## 2011-06-11 NOTE — Patient Instructions (Signed)
Continue your current medications.  Follow-up in one year, sooner for any problems

## 2011-09-12 ENCOUNTER — Encounter: Payer: Self-pay | Admitting: Family Medicine

## 2011-09-12 ENCOUNTER — Ambulatory Visit (INDEPENDENT_AMBULATORY_CARE_PROVIDER_SITE_OTHER): Payer: Medicare Other | Admitting: Family Medicine

## 2011-09-12 VITALS — BP 170/100 | Temp 98.4°F | Wt 146.0 lb

## 2011-09-12 DIAGNOSIS — J449 Chronic obstructive pulmonary disease, unspecified: Secondary | ICD-10-CM

## 2011-09-12 DIAGNOSIS — J4489 Other specified chronic obstructive pulmonary disease: Secondary | ICD-10-CM

## 2011-09-12 MED ORDER — PREDNISONE 20 MG PO TABS: ORAL_TABLET | ORAL | Status: DC

## 2011-09-12 MED ORDER — HYDROCODONE-HOMATROPINE 5-1.5 MG/5ML PO SYRP: ORAL_SOLUTION | ORAL | Status: DC

## 2011-09-12 NOTE — Progress Notes (Signed)
  Subjective:    Patient ID: Peter Wolfe, male    DOB: 08-Aug-1929, 78 y.o.   MRN: [id number]  HPIDonald is an 77 year old male, who comes in today with a 5-day history of cough, head congestion.  No fever, chills, no sputum production.  He has a history of underlying COPD.  He is wheezing mildly.  Medicine review of systems shows he is allergic to codeine tablets however, he can take the hydrocodone cough syrup   Review of Systems    General and pulmonary venous systems otherwise negative Objective:   Physical Exam  Well-developed well-nourished man in no acute distress.  HEENT negative.  Neck is supple.  No adenopathy.  Lungs show symmetrical.  Breath sounds, mild late expiratory wheezing with forced expiration      Assessment & Plan:  Viral syndrome with underlying COPD and flare of asthma.  Plan rest at home lots of liquids, prednisone burst and taper, Hydromet for cough.  Return p.r.n.

## 2011-09-12 NOTE — Patient Instructions (Signed)
Rest at home for the next 4 days.  Drink lots of water.  Hydromet one half to 1 teaspoon 3 times daily as needed for cough.  Prednisone as directed.  Return p.r.n..  By Tuesday u Should be able to resume y  normal activities..............Marland Kitchen  If not, I need to see you back here in the office

## 2011-12-09 ENCOUNTER — Ambulatory Visit (INDEPENDENT_AMBULATORY_CARE_PROVIDER_SITE_OTHER): Payer: Medicare Other | Admitting: Family Medicine

## 2011-12-09 ENCOUNTER — Encounter: Payer: Self-pay | Admitting: Family Medicine

## 2011-12-09 DIAGNOSIS — L509 Urticaria, unspecified: Secondary | ICD-10-CM

## 2011-12-09 DIAGNOSIS — R071 Chest pain on breathing: Secondary | ICD-10-CM

## 2011-12-09 DIAGNOSIS — R0789 Other chest pain: Secondary | ICD-10-CM

## 2011-12-09 MED ORDER — TRAMADOL HCL 50 MG PO TABS: ORAL_TABLET | ORAL | Status: DC

## 2011-12-09 NOTE — Patient Instructions (Signed)
Motrin 400 mg twice daily for back pain  Tramadol one half to one tablet at bedtime for severe pain

## 2011-12-09 NOTE — Progress Notes (Signed)
  Subjective:    Patient ID: Peter Wolfe, male    DOB: 07-01-1929, 78 y.o.   MRN: [id number]  HPI Peter Wolfe is a 78 year old single male nonsmoker who comes in today for evaluation of 2 problems  He states about 6 weeks ago without any trauma he began having right subscapular back pain. He describes it as constant dull like a 5 on a scale of 1-10. It does not radiate. It seems to be worse if he twists or moves in a certain way. No cardiac or pulmonary symptoms no skin rash.  Has a history of allergic rhinitis and during the winter he tends to itch all over. He's also had urticaria in the past. No skin rash male   Review of Systems Gen. cardiopulmonary and dermatologic review of systems negative    Objective:   Physical Exam Well-developed well-nourished male in no acute distress cardiopulmonary exam negative except for symmetrical decrease in breath sounds consistent with chronic COPD. There is some palpable tenderness in the right subscapular area ninth 10th ribs. Skin exam shows no rash he does have extremely dry skin      Assessment & Plan:  Chest wall pain plan Motrin 400 twice a day  Dry skin prescribed treatment program

## 2012-02-20 ENCOUNTER — Ambulatory Visit (INDEPENDENT_AMBULATORY_CARE_PROVIDER_SITE_OTHER): Payer: Medicare Other | Admitting: Family Medicine

## 2012-02-20 ENCOUNTER — Encounter: Payer: Self-pay | Admitting: Family Medicine

## 2012-02-20 VITALS — BP 124/80 | Temp 99.7°F | Wt 150.0 lb

## 2012-02-20 DIAGNOSIS — J441 Chronic obstructive pulmonary disease with (acute) exacerbation: Secondary | ICD-10-CM | POA: Diagnosis not present

## 2012-02-20 DIAGNOSIS — J449 Chronic obstructive pulmonary disease, unspecified: Secondary | ICD-10-CM

## 2012-02-20 MED ORDER — METHYLPREDNISOLONE ACETATE 80 MG/ML IJ SUSP
80.0000 mg | Freq: Once | INTRAMUSCULAR | Status: AC
  Administered 2012-02-20: 80 mg via INTRAMUSCULAR

## 2012-02-20 MED ORDER — LEVOFLOXACIN 500 MG PO TABS: 500.0000 mg | ORAL_TABLET | Freq: Every day | ORAL | Status: DC

## 2012-02-20 NOTE — Patient Instructions (Signed)
Follow up immediately for any fever or worsening symptoms. 

## 2012-02-20 NOTE — Progress Notes (Signed)
  Subjective:    Patient ID: Peter Wolfe, male    DOB: 04-10-1929, 78 y.o.   MRN: [id number]  HPI  Patient seen with cough which is progressive over the past week. History of COPD but has never smoked. Is possibly related to second hand smoke and occupational related. Cough has been mostly dry. Yesterday he developed low-grade fever. No nausea or vomiting. Cough exacerbated by movement. Occasional headache. No sore throat. No nasal congestion. No hemoptysis. No recent reported appetite or weight changes.  Past Medical History  Diagnosis Date  . COPD (chronic obstructive pulmonary disease)   . Insomnia   . Panic attack   . Hyperlipidemia   . GERD (gastroesophageal reflux disease)   . Hypertension   . BPH (benign prostatic hyperplasia)    No past surgical history on file.  reports that he has never smoked. He does not have any smokeless tobacco history on file. He reports that he does not drink alcohol or use illicit drugs. family history is not on file. Allergies  Allergen Reactions  . Hydrocodone-Ibuprofen   . Influenza Vac Typ A&B Surf Ant     REACTION: allergic to eggs     Review of Systems  Constitutional: Positive for fever and fatigue. Negative for chills and appetite change.  HENT: Negative for sore throat.   Respiratory: Positive for cough, shortness of breath and wheezing.   Cardiovascular: Negative for chest pain and palpitations.  Gastrointestinal: Negative for nausea and vomiting.  Genitourinary: Negative for dysuria.  Neurological: Positive for headaches.       Objective:   Physical Exam  Constitutional: He is oriented to person, place, and time. He appears well-developed and well-nourished.  HENT:  Right Ear: External ear normal.  Left Ear: External ear normal.  Mouth/Throat: Oropharynx is clear and moist.  Neck: Neck supple.  Cardiovascular: Normal rate and regular rhythm.   Pulmonary/Chest: Effort normal.       Patient has some diffuse expiratory  wheezes. No retractions. No rales.  Musculoskeletal: He exhibits no edema.  Neurological: He is alert and oriented to person, place, and time.          Assessment & Plan:  Acute exacerbation of COPD. Pulse oximetry 95%. No respiratory distress. Given his age and COPD history start Levaquin 500 milligrams daily for 7 days. Depo-Medrol 80 mg IM. He has home rescue inhaler to use as needed.

## 2012-02-23 ENCOUNTER — Encounter (HOSPITAL_COMMUNITY): Payer: Self-pay | Admitting: Emergency Medicine

## 2012-02-23 ENCOUNTER — Emergency Department (HOSPITAL_COMMUNITY)
Admission: EM | Admit: 2012-02-23 | Discharge: 2012-02-24 | Disposition: A | Discharge status: Home / Self Care | Payer: Medicare Other | Attending: Emergency Medicine | Admitting: Emergency Medicine

## 2012-02-23 ENCOUNTER — Emergency Department (HOSPITAL_COMMUNITY): Payer: Medicare Other

## 2012-02-23 DIAGNOSIS — R918 Other nonspecific abnormal finding of lung field: Secondary | ICD-10-CM | POA: Diagnosis not present

## 2012-02-23 DIAGNOSIS — N400 Enlarged prostate without lower urinary tract symptoms: Secondary | ICD-10-CM | POA: Insufficient documentation

## 2012-02-23 DIAGNOSIS — J4489 Other specified chronic obstructive pulmonary disease: Secondary | ICD-10-CM | POA: Insufficient documentation

## 2012-02-23 DIAGNOSIS — E785 Hyperlipidemia, unspecified: Secondary | ICD-10-CM | POA: Insufficient documentation

## 2012-02-23 DIAGNOSIS — K219 Gastro-esophageal reflux disease without esophagitis: Secondary | ICD-10-CM | POA: Insufficient documentation

## 2012-02-23 DIAGNOSIS — R059 Cough, unspecified: Secondary | ICD-10-CM | POA: Diagnosis not present

## 2012-02-23 DIAGNOSIS — R05 Cough: Secondary | ICD-10-CM | POA: Insufficient documentation

## 2012-02-23 DIAGNOSIS — R0602 Shortness of breath: Secondary | ICD-10-CM | POA: Diagnosis not present

## 2012-02-23 DIAGNOSIS — J8410 Pulmonary fibrosis, unspecified: Secondary | ICD-10-CM | POA: Diagnosis not present

## 2012-02-23 DIAGNOSIS — J449 Chronic obstructive pulmonary disease, unspecified: Secondary | ICD-10-CM

## 2012-02-23 DIAGNOSIS — I10 Essential (primary) hypertension: Secondary | ICD-10-CM | POA: Insufficient documentation

## 2012-02-23 LAB — POCT I-STAT, CHEM 8
Calcium, Ion: 1.07 mmol/L — ABNORMAL LOW (ref 1.12–1.32)
Chloride: 97 mEq/L (ref 96–112)
Creatinine, Ser: 1.1 mg/dL (ref 0.50–1.35)
Glucose, Bld: 106 mg/dL — ABNORMAL HIGH (ref 70–99)
Potassium: 3.2 mEq/L — ABNORMAL LOW (ref 3.5–5.1)

## 2012-02-23 LAB — DIFFERENTIAL
Eosinophils Absolute: 0.1 10*3/uL (ref 0.0–0.7)
Lymphs Abs: 1.1 10*3/uL (ref 0.7–4.0)
Monocytes Absolute: 0.8 10*3/uL (ref 0.1–1.0)
Monocytes Relative: 13 % — ABNORMAL HIGH (ref 3–12)
Neutro Abs: 4.3 10*3/uL (ref 1.7–7.7)
Neutrophils Relative %: 68 % (ref 43–77)

## 2012-02-23 LAB — CBC
HCT: 35.6 % — ABNORMAL LOW (ref 39.0–52.0)
Hemoglobin: 12.9 g/dL — ABNORMAL LOW (ref 13.0–17.0)
MCH: 33.3 pg (ref 26.0–34.0)
RBC: 3.87 MIL/uL — ABNORMAL LOW (ref 4.22–5.81)

## 2012-02-23 MED ORDER — SODIUM CHLORIDE 0.9 % IV BOLUS (SEPSIS)
500.0000 mL | Freq: Once | INTRAVENOUS | Status: AC
  Administered 2012-02-23: 500 mL via INTRAVENOUS

## 2012-02-23 MED ORDER — PREDNISONE 10 MG PO TABS: 20.0000 mg | ORAL_TABLET | Freq: Every day | ORAL | Status: DC

## 2012-02-23 MED ORDER — ALBUTEROL SULFATE HFA 108 (90 BASE) MCG/ACT IN AERS: 1.0000 | INHALATION_SPRAY | Freq: Four times a day (QID) | RESPIRATORY_TRACT | Status: DC | PRN

## 2012-02-23 MED ORDER — ALBUTEROL SULFATE (5 MG/ML) 0.5% IN NEBU
5.0000 mg | INHALATION_SOLUTION | Freq: Once | RESPIRATORY_TRACT | Status: AC
  Administered 2012-02-23: 5 mg via RESPIRATORY_TRACT
  Filled 2012-02-23 (×2): qty 0.5

## 2012-02-23 MED ORDER — ALBUTEROL SULFATE HFA 108 (90 BASE) MCG/ACT IN AERS
2.0000 | INHALATION_SPRAY | RESPIRATORY_TRACT | Status: DC | PRN
  Administered 2012-02-23: 2 via RESPIRATORY_TRACT
  Filled 2012-02-23: qty 6.7

## 2012-02-23 MED ORDER — SODIUM CHLORIDE 0.9 % IV SOLN
INTRAVENOUS | Status: DC
  Administered 2012-02-23: 23:00:00 via INTRAVENOUS

## 2012-02-23 MED ORDER — IPRATROPIUM BROMIDE 0.02 % IN SOLN
0.5000 mg | Freq: Once | RESPIRATORY_TRACT | Status: AC
  Administered 2012-02-23: 0.5 mg via RESPIRATORY_TRACT
  Filled 2012-02-23: qty 2.5

## 2012-02-23 NOTE — ED Provider Notes (Signed)
History     CSN: [id number]  Arrival date & time 02/23/12  1907   First MD Initiated Contact with Patient 02/23/12 2119      Chief Complaint  Patient presents with  . Shortness of Breath    (Consider location/radiation/quality/duration/timing/severity/associated sxs/prior treatment) Patient is a 78 y.o. male presenting with shortness of breath. The history is provided by the patient.  Shortness of Breath  The current episode started 5 to 7 days ago. Associated symptoms include cough and shortness of breath. Pertinent negatives include no chest pain.   patient's had cough and shortness of breath the last several days. He saw his primary care doctor on Friday and was given a steroid shot and was started on Levaquin. He's continued to have symptoms and took Mucinex. He states that since then it got worse. He states it feels like he is having more trouble breathing now. No chest pain. His had some clear sputum production. No fevers. He states he did have one episode earlier today her hip pain in his left lower leg. As since improved. There is no trauma. No erythema. Patient states he does not use an inhaler this has not helped in the past.  Past Medical History  Diagnosis Date  . COPD (chronic obstructive pulmonary disease)   . Insomnia   . Panic attack   . Hyperlipidemia   . GERD (gastroesophageal reflux disease)   . Hypertension   . BPH (benign prostatic hyperplasia)     History reviewed. No pertinent past surgical history.  History reviewed. No pertinent family history.  History  Substance Use Topics  . Smoking status: Never Smoker   . Smokeless tobacco: Not on file  . Alcohol Use: No      Review of Systems  Constitutional: Negative for activity change and appetite change.  HENT: Negative for neck stiffness.   Eyes: Negative for pain.  Respiratory: Positive for cough and shortness of breath. Negative for chest tightness.   Cardiovascular: Negative for chest pain and  leg swelling.  Gastrointestinal: Negative for nausea, vomiting, abdominal pain and diarrhea.  Genitourinary: Negative for flank pain.  Musculoskeletal: Negative for back pain.  Skin: Negative for rash.  Neurological: Negative for weakness, numbness and headaches.  Psychiatric/Behavioral: Negative for behavioral problems.    Allergies  Hydrocodone-ibuprofen and Influenza vac typ a&b surf ant  Home Medications   Current Outpatient Rx  Name Route Sig Dispense Refill  . ASPIRIN 325 MG PO TABS Oral Take 325 mg by mouth daily.      . BUSPIRONE HCL 30 MG PO TABS Oral Take 1 tablet (30 mg total) by mouth 2 (two) times daily. 100 tablet 3  . VITAMIN D 1000 UNITS PO TABS Oral Take 1,000 Units by mouth daily.      Marland Kitchen CLONIDINE HCL 0.1 MG PO TABS Oral Take 1 tablet (0.1 mg total) by mouth daily. 100 tablet 3  . FLUOXETINE HCL 20 MG PO CAPS Oral Take 1 capsule (20 mg total) by mouth daily. 100 capsule 3  . LANSOPRAZOLE 30 MG PO CPDR Oral Take 1 capsule (30 mg total) by mouth daily. 100 capsule 3  . LEVOFLOXACIN 500 MG PO TABS Oral Take 1 tablet (500 mg total) by mouth daily. 7 tablet 0  . SIMVASTATIN 10 MG PO TABS Oral Take 1 tablet (10 mg total) by mouth at bedtime. 100 tablet 3  . TAMSULOSIN HCL 0.4 MG PO CAPS Oral Take 1 capsule (0.4 mg total) by mouth daily. 100 capsule 3  .  TRAMADOL HCL 50 MG PO TABS  One half to one tablet at bedtime. For severe back pain 30 tablet 1  . TRAZODONE HCL 100 MG PO TABS Oral Take 1 tablet (100 mg total) by mouth at bedtime. 100 tablet 3  . TRIAMTERENE-HCTZ 75-50 MG PO TABS Oral Take 1 tablet by mouth daily. 100 tablet 3  . ALBUTEROL SULFATE HFA 108 (90 BASE) MCG/ACT IN AERS Inhalation Inhale 1-2 puffs into the lungs every 6 (six) hours as needed for wheezing. 1 Inhaler 0  . PREDNISONE 10 MG PO TABS Oral Take 2 tablets (20 mg total) by mouth daily. 4 tablet 0    BP 110/86  Pulse 88  Temp 98.1 F (36.7 C)  Resp 24  SpO2 99%  Physical Exam  Nursing note and  vitals reviewed. Constitutional: He is oriented to person, place, and time. He appears well-developed and well-nourished.  HENT:  Head: Normocephalic and atraumatic.  Eyes: EOM are normal. Pupils are equal, round, and reactive to light.  Neck: Normal range of motion. Neck supple.  Cardiovascular: Normal rate, regular rhythm and normal heart sounds.   No murmur heard. Pulmonary/Chest: Effort normal. He has wheezes.       Harsh diffuse wheezes throughout. Prolonged expirations.  Abdominal: Soft. Bowel sounds are normal. He exhibits no distension and no mass. There is no tenderness. There is no rebound and no guarding.  Musculoskeletal: Normal range of motion. He exhibits no edema and no tenderness.       No skin change or tenderness to left lower extremity. Dorsalis pedis pulse intact.  Neurological: He is alert and oriented to person, place, and time. No cranial nerve deficit.  Skin: Skin is warm and dry.  Psychiatric: He has a normal mood and affect.    ED Course  Procedures (including critical care time)  Labs Reviewed  CBC - Abnormal; Notable for the following:    RBC 3.87 (*)    Hemoglobin 12.9 (*)    HCT 35.6 (*)    MCHC 36.2 (*)    All other components within normal limits  DIFFERENTIAL - Abnormal; Notable for the following:    Monocytes Relative 13 (*)    All other components within normal limits  POCT I-STAT, CHEM 8 - Abnormal; Notable for the following:    Sodium 132 (*)    Potassium 3.2 (*)    Glucose, Bld 106 (*)    Calcium, Ion 1.07 (*)    Hemoglobin 12.6 (*)    HCT 37.0 (*)    All other components within normal limits   Dg Chest 2 View  02/23/2012  *RADIOLOGY REPORT*  Clinical Data: Shortness of breath.  Cough.  COPD.  CHEST - 2 VIEW  Comparison: 06/13/2005  Findings: Normal heart size and pulmonary vascularity. Emphysematous changes and scattered fibrosis in the lungs.  No focal airspace consolidation.  No blunting of costophrenic angles. No pneumothorax.   Moderate sized esophageal hiatal hernia containing an air-fluid level.  Calcified and tortuous aorta.  Mild degenerative changes in the spine.  IMPRESSION: Emphysematous changes and scattered fibrosis in the lungs. Moderate sized esophageal hiatal hernia.  No evidence of active infiltration.  Original Report Authenticated By: Marlon Pel, M.D.     1. COPD (chronic obstructive pulmonary disease)       MDM  Patient with cough and shortness of breath. Wheezing. Improved after breathing treatment. Patient appears to have a COPD exacerbation. X-ray does not show pneumonia. Patient started on antibiotics. Patient had complained  of some left calf pain, but is  nontender  there is no swelling. He does not smoke. He's had no mobility. I doubt DVT in the calf.   Juliet Rude. Rubin Payor, MD 02/23/12 [phone]

## 2012-02-23 NOTE — ED Notes (Signed)
Pt reports that he visited PCP's office 2 days ago but PCP not available, another doctor checked him-- was prescribed Levaquin 500mg  1 tablet x 7 days, has taken 3 doses as of today but he does not feel getting better, states "getting worse".  Pt also reports having calf pain since this morning,  states this is new to him.

## 2012-02-23 NOTE — ED Notes (Signed)
Pt ambulated in hallway x 2 without O2, tolerated well--- O2 sat remains 92-93% on room air during ambulation.  Pt resting in bed at this time--- O2 sat remains 97% on room air while at rest.  Denies difficulty of breathing or shortness of breath with ambulation.

## 2012-02-23 NOTE — Discharge Instructions (Signed)

## 2012-02-23 NOTE — ED Notes (Signed)
C/O SOB X 3 days. Saw PCP on Friday, got a "shot" and put on abx.  Took Mucinex then started feeling like "I'm shutting down". Hx of COPD.

## 2012-02-24 ENCOUNTER — Encounter: Payer: Self-pay | Admitting: Family Medicine

## 2012-02-24 ENCOUNTER — Ambulatory Visit (INDEPENDENT_AMBULATORY_CARE_PROVIDER_SITE_OTHER): Payer: Medicare Other | Admitting: Family Medicine

## 2012-02-24 VITALS — BP 120/84 | HR 107 | Temp 98.3°F | Wt 152.0 lb

## 2012-02-24 DIAGNOSIS — J449 Chronic obstructive pulmonary disease, unspecified: Secondary | ICD-10-CM

## 2012-02-24 DIAGNOSIS — G4700 Insomnia, unspecified: Secondary | ICD-10-CM

## 2012-02-24 DIAGNOSIS — F410 Panic disorder [episodic paroxysmal anxiety] without agoraphobia: Secondary | ICD-10-CM

## 2012-02-24 MED ORDER — LORAZEPAM 0.5 MG PO TABS: ORAL_TABLET | ORAL | Status: DC

## 2012-02-24 MED ORDER — BECLOMETHASONE DIPROPIONATE 80 MCG/ACT IN AERS: INHALATION_SPRAY | RESPIRATORY_TRACT | Status: DC

## 2012-02-24 NOTE — Patient Instructions (Addendum)
Qvar 80 mg,,,,,,,,,,, 1 puff twice daily  Albuterol,,,,,,,,,,,,,, 2 puffs twice daily  Rest at home  Ativan,,,,,,,,,,,,, 1 tablet 3 times daily  Return tomorrow for followup  Stop the Levaquin

## 2012-02-24 NOTE — Progress Notes (Signed)
  Subjective:    Patient ID: Peter Wolfe, male    DOB: April 07, 1929, 78 y.o.   MRN: [id number]  HPI Peter Wolfe is a 78 -year-old male who comes in today for evaluation of tremulousness and insomnia   he was here last Friday with a flare of his COPD and was given Levaquin and 80 mg of Depo-Medrol IM. The next day he began feeling shaky and nervous and has had insomnia. Last night he went to the emergency room. He was kept for 6 hours monitored no diagnosis was made they told her it might be a panic attack. However it sounds more like a medication related phenomenon. He's taken prednisone in the past PE she takes about 60 mg of oral prednisone    Review of Systems    general and pulmonary review of systems otherwise negative Objective:   Physical Exam Well-developed well-nourished male in no acute distress except very anxious and nervous difficulty sitting still  Lungs show symmetrical but decreased breath sounds bilateral moderate wheezing bilaterally symmetrical       Assessment & Plan:  COPD flare,,,,,,,,,, albuterol 2 puffs twice daily Qvar 2 puffs twice daily stop the steroids and Levaquin  Ativan 0.53 times daily as needed for anxiety

## 2012-02-25 ENCOUNTER — Encounter: Payer: Self-pay | Admitting: Family Medicine

## 2012-02-25 ENCOUNTER — Ambulatory Visit (INDEPENDENT_AMBULATORY_CARE_PROVIDER_SITE_OTHER): Payer: Medicare Other | Admitting: Family Medicine

## 2012-02-25 VITALS — BP 150/90 | Temp 98.0°F | Wt 148.0 lb

## 2012-02-25 DIAGNOSIS — J449 Chronic obstructive pulmonary disease, unspecified: Secondary | ICD-10-CM

## 2012-02-25 MED ORDER — HYDROCODONE-HOMATROPINE 5-1.5 MG/5ML PO SYRP: 5.0000 mL | ORAL_SOLUTION | Freq: Three times a day (TID) | ORAL | Status: AC | PRN

## 2012-02-25 NOTE — Patient Instructions (Signed)
Continue the Qvar,,,,,,,,,, one puff twice daily  Albuterol 2 puffs 3 times daily  Drink lots of water  Hydromet 1/2-1 teaspoon 3 times daily as needed for cough  Rest at home!!!!!!!!!!!!!!  Levaquin 1 daily

## 2012-02-25 NOTE — Progress Notes (Signed)
  Subjective:    Patient ID: Peter Wolfe, male    DOB: Apr 01, 1929, 78 y.o.   MRN: [id number]  HPI [name] is 78 year old male who comes in today for followup of exacerbation of his COPD  He developed a severe side effect from a shot of steroids and developed extreme anxiety and nervousness. We stopped the oral steroids put him on Ativan 0.53 times a day and he feels much better. We also started an inhaled steroid Qvar 80 one puff twice a day however he's not been taking his albuterol    Review of Systems General and cardiopulmonary review of systems otherwise negative    Objective:   Physical Exam Well-developed well-nourished male in no acute distress expiratory rate is 10 and unlabored lungs shows symmetrical breath sounds bilateral expiratory wheezing and rhonchi       Assessment & Plan:  Flare of COPD plan continue Qvar one puff twice a day albuterol 3 times daily lots of water Hydromet rest at home

## 2012-03-01 ENCOUNTER — Encounter: Payer: Self-pay | Admitting: Family Medicine

## 2012-03-01 ENCOUNTER — Ambulatory Visit (INDEPENDENT_AMBULATORY_CARE_PROVIDER_SITE_OTHER): Payer: Medicare Other | Admitting: Family Medicine

## 2012-03-01 VITALS — BP 140/90 | Temp 98.4°F | Wt 146.0 lb

## 2012-03-01 DIAGNOSIS — J449 Chronic obstructive pulmonary disease, unspecified: Secondary | ICD-10-CM | POA: Diagnosis not present

## 2012-03-01 NOTE — Progress Notes (Signed)
  Subjective:    Patient ID: Peter Wolfe, male    DOB: 1929-03-05, 78 y.o.   MRN: [id number]  HPI Peter Wolfe is a G30-year-old male who comes in today for followup of severe COPD  He has been resting at home using his albuterol and Qvar and Hydromet cough syrup also taking an antibiotic Levaquin 1 daily. No fever he still bringing up some discolored sputum but he feels much much better since she's been resting at home.    Review of Systems    general and pulmonary review of systems otherwise negative Objective:   Physical Exam  Well-developed well-nourished male in no acute distress despite her right 12 and regular HEENT negative neck was supple cardiac exam normal lungs shows decreased breath sounds but still are symmetrical and the wheezing has gone.      Assessment & Plan:  COPD with flare of asthma resolving plan continue inhalers rest at home begin rehabilitation is

## 2012-03-01 NOTE — Patient Instructions (Addendum)
Take one puff of albuterol and Qvar twice daily  Hydromet 1/2 teaspoon at bedtime when necessary for nighttime cough  Begin a walking program outside to increase y  strength and energy level  Return in 2 weeks for followup  Do not go back to work until cleared by me

## 2012-03-09 ENCOUNTER — Encounter: Payer: Self-pay | Admitting: Family Medicine

## 2012-03-09 ENCOUNTER — Ambulatory Visit (INDEPENDENT_AMBULATORY_CARE_PROVIDER_SITE_OTHER): Payer: Medicare Other | Admitting: Family Medicine

## 2012-03-09 VITALS — BP 120/80 | Temp 99.1°F | Wt 147.0 lb

## 2012-03-09 DIAGNOSIS — J449 Chronic obstructive pulmonary disease, unspecified: Secondary | ICD-10-CM

## 2012-03-09 DIAGNOSIS — J4489 Other specified chronic obstructive pulmonary disease: Secondary | ICD-10-CM

## 2012-03-09 NOTE — Progress Notes (Signed)
  Subjective:    Patient ID: Peter Wolfe, male    DOB: July 31, 1929, 78 y.o.   MRN: [id number]  HPI Peter Wolfe is 78 year old male who comes in today for evaluation of 2 problems  This morning he woke up and noticed some discoloration to the medial side of his left ankle. No history of trauma  He has a history of underlying COPD and recently went through a severe bout. We've tapered off his prednisone he is currently on Qvar 80 twice a day and albuterol 4 times a day. He states he feels back to baseline   Review of Systems    general and pulmonary review of systems otherwise negative Objective:   Physical Exam Well-developed well-nourished male in no acute distress examination of the lower extremities shows a bruise consistent with a superficial hemorrhage.  Lungs are clear       Assessment & Plan:  COPD improved DC albuterol decreased at Qvar to one puff daily return when necessary  Superficial bruise left lower extremity observe

## 2012-03-09 NOTE — Patient Instructions (Signed)
Stop the albuterol completely for now  Qvar 1 puff daily at bedtime  Return when necessary

## 2012-03-15 ENCOUNTER — Ambulatory Visit: Payer: Medicare Other | Admitting: Family Medicine

## 2012-03-19 ENCOUNTER — Other Ambulatory Visit: Payer: Self-pay | Admitting: Family Medicine

## 2012-06-03 ENCOUNTER — Ambulatory Visit (INDEPENDENT_AMBULATORY_CARE_PROVIDER_SITE_OTHER): Payer: Medicare Other | Admitting: Family Medicine

## 2012-06-03 ENCOUNTER — Encounter: Payer: Self-pay | Admitting: Family Medicine

## 2012-06-03 VITALS — BP 140/80 | Temp 98.0°F | Ht 66.0 in | Wt 143.0 lb

## 2012-06-03 DIAGNOSIS — G4700 Insomnia, unspecified: Secondary | ICD-10-CM | POA: Diagnosis not present

## 2012-06-03 DIAGNOSIS — E785 Hyperlipidemia, unspecified: Secondary | ICD-10-CM

## 2012-06-03 DIAGNOSIS — Z23 Encounter for immunization: Secondary | ICD-10-CM

## 2012-06-03 DIAGNOSIS — R350 Frequency of micturition: Secondary | ICD-10-CM

## 2012-06-03 DIAGNOSIS — L509 Urticaria, unspecified: Secondary | ICD-10-CM

## 2012-06-03 DIAGNOSIS — R0789 Other chest pain: Secondary | ICD-10-CM

## 2012-06-03 DIAGNOSIS — J449 Chronic obstructive pulmonary disease, unspecified: Secondary | ICD-10-CM | POA: Diagnosis not present

## 2012-06-03 DIAGNOSIS — L259 Unspecified contact dermatitis, unspecified cause: Secondary | ICD-10-CM

## 2012-06-03 DIAGNOSIS — N139 Obstructive and reflux uropathy, unspecified: Secondary | ICD-10-CM | POA: Diagnosis not present

## 2012-06-03 DIAGNOSIS — N401 Enlarged prostate with lower urinary tract symptoms: Secondary | ICD-10-CM | POA: Diagnosis not present

## 2012-06-03 DIAGNOSIS — Z0000 Encounter for general adult medical examination without abnormal findings: Secondary | ICD-10-CM

## 2012-06-03 DIAGNOSIS — N138 Other obstructive and reflux uropathy: Secondary | ICD-10-CM

## 2012-06-03 DIAGNOSIS — K219 Gastro-esophageal reflux disease without esophagitis: Secondary | ICD-10-CM

## 2012-06-03 DIAGNOSIS — F410 Panic disorder [episodic paroxysmal anxiety] without agoraphobia: Secondary | ICD-10-CM

## 2012-06-03 LAB — BASIC METABOLIC PANEL
Chloride: 96 mEq/L (ref 96–112)
Creatinine, Ser: 0.9 mg/dL (ref 0.4–1.5)
Potassium: 3.3 mEq/L — ABNORMAL LOW (ref 3.5–5.1)

## 2012-06-03 LAB — POCT URINALYSIS DIPSTICK
Bilirubin, UA: NEGATIVE
Glucose, UA: NEGATIVE
Nitrite, UA: POSITIVE
pH, UA: 7

## 2012-06-03 LAB — CBC WITH DIFFERENTIAL/PLATELET
Basophils Absolute: 0 10*3/uL (ref 0.0–0.1)
Lymphocytes Relative: 32.5 % (ref 12.0–46.0)
Monocytes Relative: 10.7 % (ref 3.0–12.0)
Neutrophils Relative %: 54.6 % (ref 43.0–77.0)
Platelets: 175 10*3/uL (ref 150.0–400.0)
RDW: 14.2 % (ref 11.5–14.6)

## 2012-06-03 LAB — LIPID PANEL
HDL: 58 mg/dL (ref >39.00)
Total CHOL/HDL Ratio: 3
VLDL: 16 mg/dL (ref 0.0–40.0)

## 2012-06-03 LAB — PSA: PSA: 2.49 ng/mL (ref 0.10–4.00)

## 2012-06-03 MED ORDER — FLUOXETINE HCL 20 MG PO CAPS: 20.0000 mg | ORAL_CAPSULE | Freq: Every day | ORAL | Status: DC

## 2012-06-03 MED ORDER — TRAZODONE HCL 100 MG PO TABS: 100.0000 mg | ORAL_TABLET | Freq: Every day | ORAL | Status: DC

## 2012-06-03 MED ORDER — TRIAMTERENE-HCTZ 75-50 MG PO TABS: 1.0000 | ORAL_TABLET | Freq: Every day | ORAL | Status: DC

## 2012-06-03 MED ORDER — BUSPIRONE HCL 30 MG PO TABS: 30.0000 mg | ORAL_TABLET | ORAL | Status: DC

## 2012-06-03 MED ORDER — BECLOMETHASONE DIPROPIONATE 80 MCG/ACT IN AERS: INHALATION_SPRAY | RESPIRATORY_TRACT | Status: DC

## 2012-06-03 MED ORDER — SIMVASTATIN 10 MG PO TABS: 10.0000 mg | ORAL_TABLET | Freq: Every day | ORAL | Status: DC

## 2012-06-03 MED ORDER — LANSOPRAZOLE 30 MG PO CPDR: 30.0000 mg | DELAYED_RELEASE_CAPSULE | Freq: Every day | ORAL | Status: DC

## 2012-06-03 MED ORDER — TAMSULOSIN HCL 0.4 MG PO CAPS: 0.4000 mg | ORAL_CAPSULE | Freq: Every day | ORAL | Status: DC

## 2012-06-03 NOTE — Progress Notes (Signed)
  Subjective:    Patient ID: Peter Wolfe, male    DOB: [birth year], 78 y.o.   MRN: [id number]  HPI Shiven is an 78 year old male single who comes in today for a Medicare wellness examination  He has a history of chronic is asthma with underlying COPD, anxiety, hypertension, mild depression, hyperlipidemia, BPH with outlet obstruction  His medications reviewed in detail Neuman changes. He states he feels well.  He gets routine eye care, dental care, colonoscopy normal, tetanus 2012, shingles 2009, Pneumovax x2. He can't get a flu vaccine because he has a history of egg allergy.  Cognitive function normal he walks on a regular basis home health safety reviewed no issues identified, no guns in the house, he does have a health care power of attorney and living will   Review of Systems  Constitutional: Negative.   HENT: Negative.   Eyes: Negative.   Respiratory: Negative.   Cardiovascular: Negative.   Gastrointestinal: Negative.   Genitourinary: Negative.   Musculoskeletal: Negative.   Skin: Negative.   Neurological: Negative.   Hematological: Negative.   Psychiatric/Behavioral: Negative.        Objective:   Physical Exam  Constitutional: He is oriented to person, place, and time. He appears well-developed and well-nourished.  HENT:  Head: Normocephalic and atraumatic.  Right Ear: External ear normal.  Left Ear: External ear normal.  Nose: Nose normal.  Mouth/Throat: Oropharynx is clear and moist.  Eyes: Conjunctivae and EOM are normal. Pupils are equal, round, and reactive to light.  Neck: Normal range of motion. Neck supple. No JVD present. No tracheal deviation present. No thyromegaly present.  Cardiovascular: Normal rate, regular rhythm, normal heart sounds and intact distal pulses.  Exam reveals no gallop and no friction rub.   No murmur heard. Pulmonary/Chest: Effort normal and breath sounds normal. No stridor. No respiratory distress. He has no wheezes. He has no  rales. He exhibits no tenderness.  Abdominal: Soft. Bowel sounds are normal. He exhibits no distension and no mass. There is no tenderness. There is no rebound and no guarding.  Genitourinary: Rectum normal, prostate normal and penis normal. Guaiac negative stool. No penile tenderness.  Musculoskeletal: Normal range of motion. He exhibits no edema and no tenderness.  Lymphadenopathy:    He has no cervical adenopathy.  Neurological: He is alert and oriented to person, place, and time. He has normal reflexes. No cranial nerve deficit. He exhibits normal muscle tone.  Skin: Skin is warm and dry. No rash noted. No erythema. No pallor.  Psychiatric: He has a normal mood and affect. His behavior is normal. Judgment and thought content normal.          Assessment & Plan:  Healthy male  COPD continue inhaled steroid  Anxiety continue BuSpar  Hypertension continue current meds  Mild depression continue Prozac  Reflux esophagitis continue Prevacid  Hyperlipidemia continue Zocor  BPH with outlet obstruction continue Flomax  Insomnia continue trazodone.

## 2012-06-03 NOTE — Patient Instructions (Signed)
Continue current medications  Followup in 1 year sooner if any problem 

## 2012-10-23 ENCOUNTER — Other Ambulatory Visit: Payer: Self-pay | Admitting: Family Medicine

## 2012-12-06 ENCOUNTER — Ambulatory Visit (INDEPENDENT_AMBULATORY_CARE_PROVIDER_SITE_OTHER): Payer: Medicare Other | Admitting: Internal Medicine

## 2012-12-06 ENCOUNTER — Encounter: Payer: Self-pay | Admitting: Internal Medicine

## 2012-12-06 VITALS — BP 154/90 | HR 78 | Temp 97.4°F | Resp 18 | Wt 150.0 lb

## 2012-12-06 DIAGNOSIS — J449 Chronic obstructive pulmonary disease, unspecified: Secondary | ICD-10-CM | POA: Diagnosis not present

## 2012-12-06 DIAGNOSIS — R071 Chest pain on breathing: Secondary | ICD-10-CM | POA: Diagnosis not present

## 2012-12-06 MED ORDER — TRAMADOL HCL 50 MG PO TABS: ORAL_TABLET | ORAL | Status: DC

## 2012-12-06 NOTE — Patient Instructions (Signed)
You  may move around, but avoid painful motions and activities.  Apply ice to the sore area for 15 to 20 minutes 3 or 4 times daily for the next two to 3 days.  Call or return to clinic prn if these symptoms worsen or fail to improve as anticipated.  

## 2012-12-06 NOTE — Progress Notes (Signed)
Subjective:    Patient ID: Peter Wolfe, male    DOB: 25-Feb-1929, 79 y.o.   MRN: [id number]  HPI  79 year old patient who fell at church yesterday. He fell forward after missing a step landing on his right lateral chest wall area. He has persistent pain with inspiration and movement. No shortness of breath. He takes daily aspirin therapy but no anticoagulation. He has COPD which has been stable. He has treated hypertension.  Past Medical History  Diagnosis Date  . COPD (chronic obstructive pulmonary disease)   . Insomnia   . Panic attack   . Hyperlipidemia   . GERD (gastroesophageal reflux disease)   . Hypertension   . BPH (benign prostatic hyperplasia)     History   Social History  . Marital Status: Single    Spouse Name: N/A    Number of Children: N/A  . Years of Education: N/A   Occupational History  . Not on file.   Social History Main Topics  . Smoking status: Never Smoker   . Smokeless tobacco: Not on file  . Alcohol Use: No  . Drug Use: No  . Sexually Active:    Other Topics Concern  . Not on file   Social History Narrative  . No narrative on file    History reviewed. No pertinent past surgical history.  No family history on file.  Allergies  Allergen Reactions  . Hydrocodone-Ibuprofen   . Influenza Virus Vacc Split Pf     REACTION: allergic to eggs    Current Outpatient Prescriptions on File Prior to Visit  Medication Sig Dispense Refill  . aspirin 325 MG tablet Take 325 mg by mouth daily.        . busPIRone (BUSPAR) 30 MG tablet Take 1 tablet (30 mg total) by mouth 1 day or 1 dose.  100 tablet  3  . cholecalciferol (VITAMIN D) 1000 UNITS tablet Take 1,000 Units by mouth daily.        . cloNIDine (CATAPRES) 0.1 MG tablet TAKE 1 TABLET EVERY DAY  100 tablet  2  . FLUoxetine (PROZAC) 20 MG capsule Take 1 capsule (20 mg total) by mouth daily.  100 capsule  3  . lansoprazole (PREVACID) 30 MG capsule Take 1 capsule (30 mg total) by mouth daily.   100 capsule  3  . simvastatin (ZOCOR) 10 MG tablet Take 1 tablet (10 mg total) by mouth at bedtime.  100 tablet  3  . Tamsulosin HCl (FLOMAX) 0.4 MG CAPS Take 1 capsule (0.4 mg total) by mouth daily.  100 capsule  3  . traZODone (DESYREL) 100 MG tablet Take 1 tablet (100 mg total) by mouth at bedtime.  100 tablet  3  . triamterene-hydrochlorothiazide (MAXZIDE) 75-50 MG per tablet Take 1 tablet by mouth daily.  100 tablet  3  . beclomethasone (QVAR) 80 MCG/ACT inhaler 1 puff twice daily  3 Inhaler  4   No current facility-administered medications on file prior to visit.    BP 154/90  Pulse 78  Temp(Src) 97.4 F (36.3 C) (Oral)  Resp 18  Wt 150 lb (68.04 kg)  BMI 24.22 kg/m2  SpO2 94%       Review of Systems  Constitutional: Negative for fever, chills, appetite change and fatigue.  HENT: Negative for hearing loss, ear pain, congestion, sore throat, trouble swallowing, neck stiffness, dental problem, voice change and tinnitus.   Eyes: Negative for pain, discharge and visual disturbance.  Respiratory: Negative for cough, chest tightness, wheezing  and stridor.   Cardiovascular: Positive for chest pain. Negative for palpitations and leg swelling.  Gastrointestinal: Negative for nausea, vomiting, abdominal pain, diarrhea, constipation, blood in stool and abdominal distention.  Genitourinary: Negative for urgency, hematuria, flank pain, discharge, difficulty urinating and genital sores.  Musculoskeletal: Negative for myalgias, back pain, joint swelling, arthralgias and gait problem.  Skin: Negative for rash.  Neurological: Negative for dizziness, syncope, speech difficulty, weakness, numbness and headaches.  Hematological: Negative for adenopathy. Does not bruise/bleed easily.  Psychiatric/Behavioral: Negative for behavioral problems and dysphoric mood. The patient is not nervous/anxious.        Objective:   Physical Exam  Constitutional: He is oriented to person, place, and time.  He appears well-developed and well-nourished. No distress.  HENT:  Head: Normocephalic.  Right Ear: External ear normal.  Left Ear: External ear normal.  Eyes: Conjunctivae and EOM are normal.  Neck: Normal range of motion.  Cardiovascular: Normal rate and normal heart sounds.   Pulmonary/Chest: Effort normal and breath sounds normal. He exhibits tenderness.  No chest wall contusion Tenderness over the right posterolateral chest wall area  Equal breath sounds  No tachycardia or tachypnea  Abdominal: Bowel sounds are normal.  Musculoskeletal: Normal range of motion. He exhibits no edema and no tenderness.  Neurological: He is alert and oriented to person, place, and time.  Psychiatric: He has a normal mood and affect. His behavior is normal.          Assessment & Plan:   Chest wall pain secondary to trauma. He is allergic to hydrocodone and has had a very nice response to a tramadol. Will refill Hypertension stable COPD stable

## 2012-12-11 ENCOUNTER — Other Ambulatory Visit: Payer: Self-pay | Admitting: Family Medicine

## 2013-06-06 ENCOUNTER — Ambulatory Visit (INDEPENDENT_AMBULATORY_CARE_PROVIDER_SITE_OTHER): Payer: Medicare Other | Admitting: Internal Medicine

## 2013-06-06 ENCOUNTER — Encounter: Payer: Self-pay | Admitting: Internal Medicine

## 2013-06-06 VITALS — BP 120/80 | HR 87 | Temp 98.2°F | Resp 20 | Ht 66.25 in | Wt 144.0 lb

## 2013-06-06 DIAGNOSIS — J4489 Other specified chronic obstructive pulmonary disease: Secondary | ICD-10-CM

## 2013-06-06 DIAGNOSIS — J449 Chronic obstructive pulmonary disease, unspecified: Secondary | ICD-10-CM | POA: Diagnosis not present

## 2013-06-06 DIAGNOSIS — E0590 Thyrotoxicosis, unspecified without thyrotoxic crisis or storm: Secondary | ICD-10-CM

## 2013-06-06 DIAGNOSIS — E785 Hyperlipidemia, unspecified: Secondary | ICD-10-CM | POA: Diagnosis not present

## 2013-06-06 DIAGNOSIS — K219 Gastro-esophageal reflux disease without esophagitis: Secondary | ICD-10-CM | POA: Diagnosis not present

## 2013-06-06 DIAGNOSIS — Z0000 Encounter for general adult medical examination without abnormal findings: Secondary | ICD-10-CM | POA: Diagnosis not present

## 2013-06-06 MED ORDER — OMEPRAZOLE 20 MG PO CPDR: 20.0000 mg | DELAYED_RELEASE_CAPSULE | Freq: Every day | ORAL | Status: DC

## 2013-06-06 NOTE — Progress Notes (Signed)
Subjective:    Patient ID: Peter Wolfe, male    DOB: [birth year], 79 y.o.   MRN: [id number]  HPI  79 year old patient who is seen today for a preventive health examination.  Past Medical History  Diagnosis Date  . COPD (chronic obstructive pulmonary disease)   . Insomnia   . Panic attack   . Hyperlipidemia   . GERD (gastroesophageal reflux disease)   . Hypertension   . BPH (benign prostatic hyperplasia)     History   Social History  . Marital Status: Single    Spouse Name: N/A    Number of Children: N/A  . Years of Education: N/A   Occupational History  . Not on file.   Social History Main Topics  . Smoking status: Never Smoker   . Smokeless tobacco: Not on file  . Alcohol Use: No  . Drug Use: No  . Sexual Activity:    Other Topics Concern  . Not on file   Social History Narrative  . No narrative on file    No past surgical history on file.  No family history on file.  Allergies  Allergen Reactions  . Hydrocodone-Ibuprofen   . Influenza Virus Vacc Split Pf     REACTION: allergic to eggs    Current Outpatient Prescriptions on File Prior to Visit  Medication Sig Dispense Refill  . beclomethasone (QVAR) 80 MCG/ACT inhaler 1 puff twice daily  3 Inhaler  4  . busPIRone (BUSPAR) 30 MG tablet Take 1 tablet (30 mg total) by mouth 1 day or 1 dose.  100 tablet  3  . cloNIDine (CATAPRES) 0.1 MG tablet TAKE 1 TABLET EVERY DAY  100 tablet  2  . FLUoxetine (PROZAC) 20 MG capsule Take 1 capsule (20 mg total) by mouth daily.  100 capsule  3  . lansoprazole (PREVACID) 30 MG capsule Take 1 capsule (30 mg total) by mouth daily.  100 capsule  3  . simvastatin (ZOCOR) 10 MG tablet Take 1 tablet (10 mg total) by mouth at bedtime.  100 tablet  3  . Tamsulosin HCl (FLOMAX) 0.4 MG CAPS Take 1 capsule (0.4 mg total) by mouth daily.  100 capsule  3  . traZODone (DESYREL) 100 MG tablet Take 1 tablet (100 mg total) by mouth at bedtime.  100 tablet  3  .  triamterene-hydrochlorothiazide (MAXZIDE) 75-50 MG per tablet Take 1 tablet by mouth daily.  100 tablet  3   No current facility-administered medications on file prior to visit.    BP 120/80  Pulse 87  Temp(Src) 98.2 F (36.8 C) (Oral)  Resp 20  Ht 5' 6.25" (1.683 m)  Wt 144 lb (65.318 kg)  BMI 23.06 kg/m2  SpO2 96%   1. Risk factors, based on past  M,S,F history- cardiovascular risk factors include dyslipidemia  2.  Physical activities: Remains quite active physically. Former jogger now walks 6 miles per day whether and time permitting  3.  Depression/mood:History of panic disorder but no history of depression  4.  Hearing:  5.  ADL's:Independent in all aspects of daily living  6.  Fall risk:Low  7.  Home safety:No problems identified  8.  Height weight, and visual acuity;Height and weight stable no change in visual acuity  9.  Counseling:Heart healthy diet and regular exercise encouraged  10. Lab orders based on risk factors:Laboratory profile will be reviewed  11. Referral :None appropriate at this time  12. Care plan:Continue active lifestyle and heart healthy diet. Patient is  at ideal body weight. Home blood pressure monitoring recommended  13. Cognitive assessment: Alert in order with normal affect. No cognitive dysfunction       Review of Systems  Constitutional: Negative for fever, chills, appetite change and fatigue.  HENT: Negative for hearing loss, ear pain, congestion, sore throat, trouble swallowing, neck stiffness, dental problem, voice change and tinnitus.   Eyes: Negative for pain, discharge and visual disturbance.  Respiratory: Negative for cough, chest tightness, wheezing and stridor.   Cardiovascular: Negative for chest pain, palpitations and leg swelling.  Gastrointestinal: Negative for nausea, vomiting, abdominal pain, diarrhea, constipation, blood in stool and abdominal distention.  Genitourinary: Negative for urgency, hematuria, flank pain,  discharge, difficulty urinating and genital sores.  Musculoskeletal: Negative for myalgias, back pain, joint swelling, arthralgias and gait problem.  Skin: Positive for rash.  Neurological: Negative for dizziness, syncope, speech difficulty, weakness, numbness and headaches.  Hematological: Negative for adenopathy. Does not bruise/bleed easily.  Psychiatric/Behavioral: Negative for behavioral problems and dysphoric mood. The patient is not nervous/anxious.        Objective:   Physical Exam  Constitutional: He appears well-developed and well-nourished.  HENT:  Head: Normocephalic and atraumatic.  Right Ear: External ear normal.  Left Ear: External ear normal.  Nose: Nose normal.  Mouth/Throat: Oropharynx is clear and moist.  Eyes: Conjunctivae and EOM are normal. Pupils are equal, round, and reactive to light. No scleral icterus.  Neck: Normal range of motion. Neck supple. No JVD present. No thyromegaly present.  Cardiovascular: Regular rhythm, normal heart sounds and intact distal pulses.  Exam reveals no gallop and no friction rub.   No murmur heard. Pulmonary/Chest: Effort normal and breath sounds normal. He exhibits no tenderness.  Abdominal: Soft. Bowel sounds are normal. He exhibits no distension and no mass. There is no tenderness.  Genitourinary: Rectum normal, prostate normal and penis normal. Guaiac negative stool.  Prostate +2 enlarged  Musculoskeletal: Normal range of motion. He exhibits no edema and no tenderness.  Lymphadenopathy:    He has no cervical adenopathy.  Neurological: He is alert. He has normal reflexes. No cranial nerve deficit. Coordination normal.  Skin: Skin is warm and dry. No rash noted.  A few scattered excoriations involving his upper back region  Psychiatric: He has a normal mood and affect. His behavior is normal.          Assessment & Plan:     preventive health examination Hypertension stable COPD stable History of thyrotoxicosis. Will  check a followup TSH  The patient return at his convenience later this week for his lab draw. At the present time the facility has lost power. Medications unchanged with the exception of switching to omeprazole. Home blood pressure monitoring recommended

## 2013-06-06 NOTE — Patient Instructions (Signed)
Limit your sodium (Salt) intake  Please check your blood pressure on a regular basis.  If it is consistently greater than 150/90, please make an office appointment.  Return in 6 months for follow-up   

## 2013-06-13 ENCOUNTER — Other Ambulatory Visit (INDEPENDENT_AMBULATORY_CARE_PROVIDER_SITE_OTHER): Payer: Medicare Other

## 2013-06-13 DIAGNOSIS — N410 Acute prostatitis: Secondary | ICD-10-CM | POA: Diagnosis not present

## 2013-06-13 DIAGNOSIS — E785 Hyperlipidemia, unspecified: Secondary | ICD-10-CM

## 2013-06-13 DIAGNOSIS — J449 Chronic obstructive pulmonary disease, unspecified: Secondary | ICD-10-CM | POA: Diagnosis not present

## 2013-06-13 DIAGNOSIS — R351 Nocturia: Secondary | ICD-10-CM | POA: Diagnosis not present

## 2013-06-13 DIAGNOSIS — E0590 Thyrotoxicosis, unspecified without thyrotoxic crisis or storm: Secondary | ICD-10-CM

## 2013-06-13 DIAGNOSIS — Z0000 Encounter for general adult medical examination without abnormal findings: Secondary | ICD-10-CM | POA: Diagnosis not present

## 2013-06-13 LAB — LIPID PANEL
LDL Cholesterol: 96 mg/dL (ref 0–99)
Total CHOL/HDL Ratio: 3
VLDL: 15.4 mg/dL (ref 0.0–40.0)

## 2013-06-13 LAB — BASIC METABOLIC PANEL
BUN: 15 mg/dL (ref 6–23)
CO2: 30 mEq/L (ref 19–32)
Chloride: 99 mEq/L (ref 96–112)
Glucose, Bld: 103 mg/dL — ABNORMAL HIGH (ref 70–99)
Potassium: 3.6 mEq/L (ref 3.5–5.1)

## 2013-06-13 LAB — POCT URINALYSIS DIPSTICK: Urobilinogen, UA: 1

## 2013-06-13 LAB — CBC WITH DIFFERENTIAL/PLATELET
Basophils Relative: 0.3 % (ref 0.0–3.0)
Eosinophils Relative: 2.3 % (ref 0.0–5.0)
MCV: 101.1 fl — ABNORMAL HIGH (ref 78.0–100.0)
Monocytes Absolute: 0.6 10*3/uL (ref 0.1–1.0)
Neutrophils Relative %: 58.1 % (ref 43.0–77.0)
RBC: 3.93 Mil/uL — ABNORMAL LOW (ref 4.22–5.81)
WBC: 6.2 10*3/uL (ref 4.5–10.5)

## 2013-06-13 LAB — HEPATIC FUNCTION PANEL
ALT: 13 U/L (ref 0–53)
Albumin: 4 g/dL (ref 3.5–5.2)
Total Protein: 7.5 g/dL (ref 6.0–8.3)

## 2013-06-13 LAB — PSA: PSA: 2.67 ng/mL (ref 0.10–4.00)

## 2013-06-24 ENCOUNTER — Telehealth: Payer: Self-pay | Admitting: Family Medicine

## 2013-06-24 ENCOUNTER — Other Ambulatory Visit: Payer: Self-pay | Admitting: Family Medicine

## 2013-06-24 DIAGNOSIS — E785 Hyperlipidemia, unspecified: Secondary | ICD-10-CM

## 2013-06-24 DIAGNOSIS — G4700 Insomnia, unspecified: Secondary | ICD-10-CM

## 2013-06-24 DIAGNOSIS — F410 Panic disorder [episodic paroxysmal anxiety] without agoraphobia: Secondary | ICD-10-CM

## 2013-06-24 DIAGNOSIS — Z0000 Encounter for general adult medical examination without abnormal findings: Secondary | ICD-10-CM

## 2013-06-24 DIAGNOSIS — N138 Other obstructive and reflux uropathy: Secondary | ICD-10-CM

## 2013-06-24 MED ORDER — TRAZODONE HCL 100 MG PO TABS: 100.0000 mg | ORAL_TABLET | Freq: Every day | ORAL | Status: DC

## 2013-06-24 MED ORDER — SIMVASTATIN 10 MG PO TABS: 10.0000 mg | ORAL_TABLET | Freq: Every day | ORAL | Status: DC

## 2013-06-24 MED ORDER — FLUOXETINE HCL 20 MG PO CAPS: 20.0000 mg | ORAL_CAPSULE | Freq: Every day | ORAL | Status: DC

## 2013-06-24 MED ORDER — TRIAMTERENE-HCTZ 75-50 MG PO TABS: 1.0000 | ORAL_TABLET | Freq: Every day | ORAL | Status: DC

## 2013-06-24 MED ORDER — BUSPIRONE HCL 30 MG PO TABS: 30.0000 mg | ORAL_TABLET | ORAL | Status: DC

## 2013-06-24 MED ORDER — CLONIDINE HCL 0.1 MG PO TABS: ORAL_TABLET | ORAL | Status: DC

## 2013-06-24 MED ORDER — TAMSULOSIN HCL 0.4 MG PO CAPS: 0.4000 mg | ORAL_CAPSULE | Freq: Every day | ORAL | Status: DC

## 2013-06-24 NOTE — Telephone Encounter (Signed)
Hi Fleet Contras! Pt came in on the day we lost power.  He saw Dr. Kirtland Bouchard that day, 9/8. His rx's were never sent to his [facility] on [address].  Would you please refill his BUSPAR, CATAPRES, PROZAC, ZOCOR, FLOMAX, MAXINE and DESYREL?  Also pt states he had blood work done on 06/13/13 and he has not received a call about the results.  I looked in his chart and I did not see where any results have come back.  Would you please check on that and call patient about his results as well as mail a copy to him?  Thank you.

## 2013-06-24 NOTE — Telephone Encounter (Signed)
Copy mailed to home address 

## 2013-06-24 NOTE — Telephone Encounter (Signed)
Refills have been sent to [facility].  Patient is requesting lab results.

## 2013-06-24 NOTE — Telephone Encounter (Signed)
Please refill all medications and send patient copy of his lab

## 2013-06-27 ENCOUNTER — Encounter: Payer: Self-pay | Admitting: Family Medicine

## 2013-08-04 ENCOUNTER — Other Ambulatory Visit: Payer: Self-pay

## 2013-08-22 ENCOUNTER — Encounter: Payer: Self-pay | Admitting: Internal Medicine

## 2013-08-22 ENCOUNTER — Ambulatory Visit (INDEPENDENT_AMBULATORY_CARE_PROVIDER_SITE_OTHER): Payer: Medicare Other | Admitting: Internal Medicine

## 2013-08-22 VITALS — BP 150/90 | HR 71 | Temp 97.8°F | Resp 20 | Wt 147.0 lb

## 2013-08-22 DIAGNOSIS — E785 Hyperlipidemia, unspecified: Secondary | ICD-10-CM | POA: Diagnosis not present

## 2013-08-22 DIAGNOSIS — L723 Sebaceous cyst: Secondary | ICD-10-CM | POA: Diagnosis not present

## 2013-08-22 DIAGNOSIS — L720 Epidermal cyst: Secondary | ICD-10-CM

## 2013-08-22 DIAGNOSIS — J449 Chronic obstructive pulmonary disease, unspecified: Secondary | ICD-10-CM | POA: Diagnosis not present

## 2013-08-22 MED ORDER — DOXYCYCLINE HYCLATE 100 MG PO TABS: 100.0000 mg | ORAL_TABLET | Freq: Two times a day (BID) | ORAL | Status: DC

## 2013-08-22 NOTE — Progress Notes (Signed)
Pre-visit discussion using our clinic review tool. No additional management support is needed unless otherwise documented below in the visit note.  

## 2013-08-22 NOTE — Patient Instructions (Signed)
Call or return to clinic prn if these symptoms worsen or fail to improve as anticipated.  Take your antibiotic as prescribed until ALL of it is gone, but stop if you develop a rash, swelling, or any side effects of the medication.  Contact our office as soon as possible if  there are side effects of the medication. 

## 2013-08-22 NOTE — Progress Notes (Signed)
Subjective:    Patient ID: Peter Wolfe, male    DOB: 01-05-1929, 79 y.o.   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 2 days ago he noted a painful nodule involving the left posterior neck area. This has gotten smaller over the past 2 days but is still a uncomfortable. No fever or other constitutional complaints. Nonsmoker.  Past Medical History  Diagnosis Date  . COPD (chronic obstructive pulmonary disease)   . Insomnia   . Panic attack   . Hyperlipidemia   . GERD (gastroesophageal reflux disease)   . Hypertension   . BPH (benign prostatic hyperplasia)     History   Social History  . Marital Status: Single    Spouse Name: N/A    Number of Children: N/A  . Years of Education: N/A   Occupational History  . Not on file.   Social History Main Topics  . Smoking status: Never Smoker   . Smokeless tobacco: Not on file  . Alcohol Use: No  . Drug Use: No  . Sexual Activity:    Other Topics Concern  . Not on file   Social History Narrative  . No narrative on file    History reviewed. No pertinent past surgical history.  No family history on file.  Allergies  Allergen Reactions  . Hydrocodone-Ibuprofen   . Influenza Virus Vacc Split Pf     REACTION: allergic to eggs    Current Outpatient Prescriptions on File Prior to Visit  Medication Sig Dispense Refill  . beclomethasone (QVAR) 80 MCG/ACT inhaler 1 puff twice daily  3 Inhaler  4  . busPIRone (BUSPAR) 30 MG tablet Take 1 tablet (30 mg total) by mouth 1 day or 1 dose.  100 tablet  3  . Cholecalciferol (VITAMIN D-3) 5000 UNITS TABS Take 1 tablet by mouth daily.      . cloNIDine (CATAPRES) 0.1 MG tablet TAKE 1 TABLET EVERY DAY  100 tablet  2  . FLUoxetine (PROZAC) 20 MG capsule Take 1 capsule (20 mg total) by mouth daily.  100 capsule  3  . omeprazole (PRILOSEC) 20 MG capsule Take 1 capsule (20 mg total) by mouth daily.  30 capsule  3  . simvastatin  (ZOCOR) 10 MG tablet Take 1 tablet (10 mg total) by mouth at bedtime.  100 tablet  3  . tamsulosin (FLOMAX) 0.4 MG CAPS capsule Take 1 capsule (0.4 mg total) by mouth daily.  100 capsule  3  . traZODone (DESYREL) 100 MG tablet Take 1 tablet (100 mg total) by mouth at bedtime.  100 tablet  3  . triamterene-hydrochlorothiazide (MAXZIDE) 75-50 MG per tablet Take 1 tablet by mouth daily.  100 tablet  3   No current facility-administered medications on file prior to visit.    BP 150/90  Pulse 71  Temp(Src) 97.8 F (36.6 C) (Oral)  Resp 20  Wt 147 lb (66.679 kg)  SpO2 96%      Review of Systems  Constitutional: Negative for fever, chills, appetite change and fatigue.  HENT: Negative for congestion, dental problem, ear pain, hearing loss, sore throat, tinnitus, trouble swallowing and voice change.   Eyes: Negative for pain, discharge and visual disturbance.  Respiratory: Negative for cough, chest tightness, wheezing and stridor.   Cardiovascular: Negative for chest pain, palpitations and leg swelling.  Gastrointestinal: Negative for nausea, vomiting, abdominal pain, diarrhea, constipation, blood in stool and abdominal distention.  Genitourinary: Negative for urgency, hematuria, flank  pain, discharge, difficulty urinating and genital sores.  Musculoskeletal: Negative for arthralgias, back pain, gait problem, joint swelling, myalgias and neck stiffness.  Skin: Positive for wound. Negative for rash.  Neurological: Negative for dizziness, syncope, speech difficulty, weakness, numbness and headaches.  Hematological: Negative for adenopathy. Does not bruise/bleed easily.  Psychiatric/Behavioral: Negative for behavioral problems and dysphoric mood. The patient is not nervous/anxious.        Objective:   Physical Exam  Constitutional: He appears well-developed and well-nourished. No distress.  HENT:  Nose: Nose normal.  Mouth/Throat: Oropharynx is clear and moist.  Slightly prominent left  tonsil Upper dentures in place  Eyes: EOM are normal. Pupils are equal, round, and reactive to light.  Neck: Normal range of motion. Neck supple.  1-1.5 cm tender nodule involving the left posterior neck area just posterior to the sternocleidomastoid muscle  Lymphadenopathy:    He has no cervical adenopathy.          Assessment & Plan:   Infected epidermal cyst. Will treat with warm compresses and short term antibiotic therapy. Will call if unimproved

## 2013-11-04 ENCOUNTER — Other Ambulatory Visit: Payer: Self-pay | Admitting: Internal Medicine

## 2014-03-08 ENCOUNTER — Other Ambulatory Visit: Payer: Self-pay | Admitting: Internal Medicine

## 2014-03-14 ENCOUNTER — Telehealth: Payer: Self-pay | Admitting: Family Medicine

## 2014-03-14 DIAGNOSIS — F410 Panic disorder [episodic paroxysmal anxiety] without agoraphobia: Secondary | ICD-10-CM

## 2014-03-14 DIAGNOSIS — G4700 Insomnia, unspecified: Secondary | ICD-10-CM

## 2014-03-14 DIAGNOSIS — E785 Hyperlipidemia, unspecified: Secondary | ICD-10-CM

## 2014-03-14 DIAGNOSIS — Z0000 Encounter for general adult medical examination without abnormal findings: Secondary | ICD-10-CM

## 2014-03-14 DIAGNOSIS — N401 Enlarged prostate with lower urinary tract symptoms: Secondary | ICD-10-CM

## 2014-03-14 DIAGNOSIS — N138 Other obstructive and reflux uropathy: Secondary | ICD-10-CM

## 2014-03-14 NOTE — Telephone Encounter (Signed)
Peter Wolfe is requesting 90 day re-fills on the following: omeprazole (PRILOSEC) 20 MG capsule traZODone (DESYREL) 100 MG tablet triamterene-hydrochlorothiazide (MAXZIDE) 75-50 MG per tablet busPIRone (BUSPAR) 30 MG tablet cloNIDine (CATAPRES) 0.1 MG tablet simvastatin (ZOCOR) 10 MG tablet FLUoxetine (PROZAC) 20 MG capsule tamsulosin (FLOMAX) 0.4 MG CAPS capsule

## 2014-03-15 MED ORDER — SIMVASTATIN 10 MG PO TABS: 10.0000 mg | ORAL_TABLET | Freq: Every day | ORAL | Status: DC

## 2014-03-15 MED ORDER — OMEPRAZOLE 20 MG PO CPDR
DELAYED_RELEASE_CAPSULE | ORAL | Status: DC
Start: 2014-03-15 — End: 2014-06-15

## 2014-03-15 MED ORDER — BUSPIRONE HCL 30 MG PO TABS: 30.0000 mg | ORAL_TABLET | Freq: Every day | ORAL | Status: DC

## 2014-03-15 MED ORDER — FLUOXETINE HCL 20 MG PO CAPS
20.0000 mg | ORAL_CAPSULE | Freq: Every day | ORAL | Status: DC
Start: 2014-03-15 — End: 2014-06-15

## 2014-03-15 MED ORDER — CLONIDINE HCL 0.1 MG PO TABS: ORAL_TABLET | ORAL | Status: DC

## 2014-03-15 MED ORDER — TRIAMTERENE-HCTZ 75-50 MG PO TABS: 1.0000 | ORAL_TABLET | Freq: Every day | ORAL | Status: DC

## 2014-03-15 MED ORDER — TAMSULOSIN HCL 0.4 MG PO CAPS: 0.4000 mg | ORAL_CAPSULE | Freq: Every day | ORAL | Status: DC

## 2014-03-15 MED ORDER — TRAZODONE HCL 100 MG PO TABS: 100.0000 mg | ORAL_TABLET | Freq: Every day | ORAL | Status: DC

## 2014-03-15 NOTE — Telephone Encounter (Signed)
Rx sent to [facility] 

## 2014-06-15 ENCOUNTER — Encounter: Payer: Self-pay | Admitting: Family Medicine

## 2014-06-15 ENCOUNTER — Ambulatory Visit (INDEPENDENT_AMBULATORY_CARE_PROVIDER_SITE_OTHER): Payer: Medicare Other | Admitting: Family Medicine

## 2014-06-15 VITALS — BP 124/80 | Temp 98.2°F | Ht 67.0 in | Wt 148.0 lb

## 2014-06-15 DIAGNOSIS — L509 Urticaria, unspecified: Secondary | ICD-10-CM | POA: Diagnosis not present

## 2014-06-15 DIAGNOSIS — F410 Panic disorder [episodic paroxysmal anxiety] without agoraphobia: Secondary | ICD-10-CM | POA: Diagnosis not present

## 2014-06-15 DIAGNOSIS — I10 Essential (primary) hypertension: Secondary | ICD-10-CM

## 2014-06-15 DIAGNOSIS — Z23 Encounter for immunization: Secondary | ICD-10-CM

## 2014-06-15 DIAGNOSIS — K219 Gastro-esophageal reflux disease without esophagitis: Secondary | ICD-10-CM

## 2014-06-15 DIAGNOSIS — G4700 Insomnia, unspecified: Secondary | ICD-10-CM | POA: Diagnosis not present

## 2014-06-15 DIAGNOSIS — Z0000 Encounter for general adult medical examination without abnormal findings: Secondary | ICD-10-CM

## 2014-06-15 DIAGNOSIS — E785 Hyperlipidemia, unspecified: Secondary | ICD-10-CM | POA: Diagnosis not present

## 2014-06-15 DIAGNOSIS — N139 Obstructive and reflux uropathy, unspecified: Secondary | ICD-10-CM

## 2014-06-15 DIAGNOSIS — N401 Enlarged prostate with lower urinary tract symptoms: Secondary | ICD-10-CM

## 2014-06-15 DIAGNOSIS — N138 Other obstructive and reflux uropathy: Secondary | ICD-10-CM

## 2014-06-15 DIAGNOSIS — R319 Hematuria, unspecified: Secondary | ICD-10-CM

## 2014-06-15 DIAGNOSIS — J449 Chronic obstructive pulmonary disease, unspecified: Secondary | ICD-10-CM

## 2014-06-15 LAB — POCT URINALYSIS DIPSTICK
BILIRUBIN UA: NEGATIVE
GLUCOSE UA: NEGATIVE
Ketones, UA: NEGATIVE
Nitrite, UA: NEGATIVE
Protein, UA: NEGATIVE
Spec Grav, UA: 1.01
Urobilinogen, UA: 1
pH, UA: 7

## 2014-06-15 LAB — CBC WITH DIFFERENTIAL/PLATELET
BASOS ABS: 0 10*3/uL (ref 0.0–0.1)
Basophils Relative: 0.2 % (ref 0.0–3.0)
Eosinophils Absolute: 0.2 10*3/uL (ref 0.0–0.7)
Eosinophils Relative: 2.9 % (ref 0.0–5.0)
HCT: 39.9 % (ref 39.0–52.0)
Hemoglobin: 13.8 g/dL (ref 13.0–17.0)
LYMPHS PCT: 30.2 % (ref 12.0–46.0)
Lymphs Abs: 1.6 10*3/uL (ref 0.7–4.0)
MCHC: 34.5 g/dL (ref 30.0–36.0)
MCV: 103.1 fl — ABNORMAL HIGH (ref 78.0–100.0)
MONOS PCT: 10.5 % (ref 3.0–12.0)
Monocytes Absolute: 0.6 10*3/uL (ref 0.1–1.0)
NEUTROS ABS: 3 10*3/uL (ref 1.4–7.7)
Neutrophils Relative %: 56.2 % (ref 43.0–77.0)
Platelets: 167 10*3/uL (ref 150.0–400.0)
RBC: 3.87 Mil/uL — ABNORMAL LOW (ref 4.22–5.81)
RDW: 14.5 % (ref 11.5–15.5)
WBC: 5.3 10*3/uL (ref 4.0–10.5)

## 2014-06-15 LAB — BASIC METABOLIC PANEL
BUN: 17 mg/dL (ref 6–23)
CO2: 32 mEq/L (ref 19–32)
Calcium: 9.6 mg/dL (ref 8.4–10.5)
Chloride: 99 mEq/L (ref 96–112)
Creatinine, Ser: 1.2 mg/dL (ref 0.4–1.5)
GFR: 61.41 mL/min (ref >60.00)
GLUCOSE: 113 mg/dL — AB (ref 70–99)
POTASSIUM: 4 meq/L (ref 3.5–5.1)
SODIUM: 137 meq/L (ref 135–145)

## 2014-06-15 LAB — LIPID PANEL
CHOL/HDL RATIO: 4
Cholesterol: 173 mg/dL (ref 0–200)
HDL: 43.2 mg/dL (ref >39.00)
LDL Cholesterol: 107 mg/dL — ABNORMAL HIGH (ref 0–99)
NONHDL: 129.8
Triglycerides: 116 mg/dL (ref 0.0–149.0)
VLDL: 23.2 mg/dL (ref 0.0–40.0)

## 2014-06-15 LAB — HEPATIC FUNCTION PANEL
ALT: 15 U/L (ref 0–53)
AST: 15 U/L (ref 0–37)
Albumin: 4 g/dL (ref 3.5–5.2)
Alkaline Phosphatase: 42 U/L (ref 39–117)
BILIRUBIN DIRECT: 0.2 mg/dL (ref 0.0–0.3)
Total Bilirubin: 1 mg/dL (ref 0.2–1.2)
Total Protein: 8.3 g/dL (ref 6.0–8.3)

## 2014-06-15 LAB — TSH: TSH: 1.78 u[IU]/mL (ref 0.35–4.50)

## 2014-06-15 MED ORDER — CLONIDINE HCL 0.1 MG PO TABS: ORAL_TABLET | ORAL | Status: DC

## 2014-06-15 MED ORDER — TRIAMTERENE-HCTZ 75-50 MG PO TABS: 1.0000 | ORAL_TABLET | Freq: Every day | ORAL | Status: DC

## 2014-06-15 MED ORDER — OMEPRAZOLE 20 MG PO CPDR: DELAYED_RELEASE_CAPSULE | ORAL | Status: DC

## 2014-06-15 MED ORDER — BUSPIRONE HCL 30 MG PO TABS: 30.0000 mg | ORAL_TABLET | Freq: Every day | ORAL | Status: DC

## 2014-06-15 MED ORDER — FLUOXETINE HCL 20 MG PO CAPS: 20.0000 mg | ORAL_CAPSULE | Freq: Every day | ORAL | Status: DC

## 2014-06-15 MED ORDER — SIMVASTATIN 10 MG PO TABS: 10.0000 mg | ORAL_TABLET | Freq: Every day | ORAL | Status: DC

## 2014-06-15 MED ORDER — TRAZODONE HCL 100 MG PO TABS: 100.0000 mg | ORAL_TABLET | Freq: Every day | ORAL | Status: DC

## 2014-06-15 MED ORDER — TAMSULOSIN HCL 0.4 MG PO CAPS: 0.4000 mg | ORAL_CAPSULE | Freq: Every day | ORAL | Status: DC

## 2014-06-15 NOTE — Progress Notes (Signed)
Pre visit review using our clinic review tool, if applicable. No additional management support is needed unless otherwise documented below in the visit note. 

## 2014-06-15 NOTE — Progress Notes (Signed)
   Subjective:    Patient ID: Peter Wolfe, male    DOB: 1928/10/04, 80 y.o.   MRN: [id number]  HPI Peter Wolfe is an 80 year old single male nonsmoker who comes in today for evaluation of sleep dysfunction, hypertension, history of mild depression, reflux esophagitis, hyperlipidemia, BPH,  His blood pressure on clonidine 0.1 daily, Maxide 75 daily is 124/80. [name]  His med list reviewed there've been no changes. He gets routine eye care, dental care, no longer needs colonoscopies  He has a history of idiopathic urticaria. 6 a month ago he had an episode of itching for 3 weeks it went away spontaneously without medications. He says he is allergic to eggs and now peanuts. He continues to walk on a daily basis.  Cognitive function normal he walks daily home health safety reviewed no issues identified, no guns in the house, he does have a health care power of attorney and living well  Vaccinations updated. He can get a flu shot because of a history of egg allergy   Review of Systems  Constitutional: Negative.   HENT: Negative.   Eyes: Negative.   Respiratory: Negative.   Cardiovascular: Negative.   Gastrointestinal: Negative.   Genitourinary: Negative.   Musculoskeletal: Negative.   Skin: Negative.   Neurological: Negative.   Psychiatric/Behavioral: Negative.        Objective:   Physical Exam  Nursing note and vitals reviewed. Constitutional: He is oriented to person, place, and time. He appears well-developed and well-nourished.  HENT:  Head: Normocephalic and atraumatic.  Right Ear: External ear normal.  Left Ear: External ear normal.  Nose: Nose normal.  Mouth/Throat: Oropharynx is clear and moist.  Eyes: Conjunctivae and EOM are normal. Pupils are equal, round, and reactive to light.  Neck: Normal range of motion. Neck supple. No JVD present. No tracheal deviation present. No thyromegaly present.  Cardiovascular: Normal rate, regular rhythm, normal heart sounds and  intact distal pulses.  Exam reveals no gallop and no friction rub.   No murmur heard. No carotid no aortic bruits peripheral pulses 2+ and symmetrical  Pulmonary/Chest: Effort normal and breath sounds normal. No stridor. No respiratory distress. He has no wheezes. He has no rales. He exhibits no tenderness.  Abdominal: Soft. Bowel sounds are normal. He exhibits no distension and no mass. There is no tenderness. There is no rebound and no guarding.  Genitourinary: Rectum normal, prostate normal and penis normal. Guaiac negative stool. No penile tenderness.  Musculoskeletal: Normal range of motion. He exhibits no edema and no tenderness.  Lymphadenopathy:    He has no cervical adenopathy.  Neurological: He is alert and oriented to person, place, and time. He has normal reflexes. No cranial nerve deficit. He exhibits normal muscle tone.  Skin: Skin is warm and dry. No rash noted. No erythema. No pallor.  Psychiatric: He has a normal mood and affect. His behavior is normal. Judgment and thought content normal.          Assessment & Plan:  Hypertension adult continue current therapy  History of depression continue current therapy  Insomnia continue current therapy  History of panic attacks continue BuSpar  Hyperlipidemia continue Zocor  BPH continue Flomax  Reflux esophagitis continue Prilosec

## 2014-06-15 NOTE — Patient Instructions (Signed)
Continue your current medications  Followup in 1 year sooner if any problems 

## 2014-06-17 LAB — URINE CULTURE

## 2014-06-21 ENCOUNTER — Other Ambulatory Visit: Payer: Self-pay | Admitting: *Deleted

## 2014-06-21 MED ORDER — DOXYCYCLINE HYCLATE 100 MG PO TABS: 100.0000 mg | ORAL_TABLET | Freq: Two times a day (BID) | ORAL | Status: DC

## 2014-07-14 ENCOUNTER — Other Ambulatory Visit: Payer: Self-pay

## 2014-07-17 ENCOUNTER — Ambulatory Visit (INDEPENDENT_AMBULATORY_CARE_PROVIDER_SITE_OTHER): Payer: Medicare Other | Admitting: Family Medicine

## 2014-07-17 ENCOUNTER — Encounter: Payer: Self-pay | Admitting: Family Medicine

## 2014-07-17 VITALS — BP 124/84 | Temp 97.5°F | Wt 146.0 lb

## 2014-07-17 DIAGNOSIS — R350 Frequency of micturition: Secondary | ICD-10-CM | POA: Diagnosis not present

## 2014-07-17 DIAGNOSIS — R300 Dysuria: Secondary | ICD-10-CM | POA: Diagnosis not present

## 2014-07-17 LAB — POCT URINALYSIS DIPSTICK
BILIRUBIN UA: NEGATIVE
Glucose, UA: NEGATIVE
Ketones, UA: NEGATIVE
NITRITE UA: POSITIVE
Protein, UA: NEGATIVE
RBC UA: NEGATIVE
Spec Grav, UA: <=1.005
UROBILINOGEN UA: 0.2
pH, UA: 8.5

## 2014-07-17 MED ORDER — CIPROFLOXACIN HCL 250 MG PO TABS: 250.0000 mg | ORAL_TABLET | Freq: Two times a day (BID) | ORAL | Status: DC

## 2014-07-17 NOTE — Patient Instructions (Signed)
Cipro 250 mg................ one twice daily for 3 weeks......... followup urinalysis in 4 weeks.Marland Kitchen

## 2014-07-17 NOTE — Progress Notes (Signed)
Pre visit review using our clinic review tool, if applicable. No additional management support is needed unless otherwise documented below in the visit note. 

## 2014-07-17 NOTE — Progress Notes (Signed)
   Subjective:    Patient ID: [name], male    DOB: Nov 10, 1928, 80 y.o.   MRN: [id number]  HPI Is a 80 year old male who comes in today for followup of a urinary tract infection  He had a urinary tract infection. It grew 8-bit joints and he was started on doxycycline 100 mg twice a day. He's taken that for 3 weeks. He says the pain on urination is gone but otherwise he still notices dysuria and frequency.  He's had this from the past and finally had to take Cipro to get the infection to quiet down   Review of Systems    review of systems otherwise negative Objective:   Physical Exam  Well-developed well-nourished male no acute distress vital signs stable he is afebrile urinalysis today shows positive nitrite moderate white cells      Assessment & Plan:  Persistent UTI........... switched to Cipro 250 twice a day followup in one month.

## 2014-08-17 ENCOUNTER — Ambulatory Visit (INDEPENDENT_AMBULATORY_CARE_PROVIDER_SITE_OTHER): Payer: Medicare Other | Admitting: Family Medicine

## 2014-08-17 ENCOUNTER — Encounter: Payer: Self-pay | Admitting: Family Medicine

## 2014-08-17 VITALS — BP 124/84 | Temp 98.5°F | Wt 149.0 lb

## 2014-08-17 DIAGNOSIS — R319 Hematuria, unspecified: Secondary | ICD-10-CM

## 2014-08-17 DIAGNOSIS — R350 Frequency of micturition: Secondary | ICD-10-CM

## 2014-08-17 LAB — POCT URINALYSIS DIPSTICK
Bilirubin, UA: NEGATIVE
Glucose, UA: NEGATIVE
Ketones, UA: NEGATIVE
NITRITE UA: NEGATIVE
PH UA: 7.5
RBC UA: NEGATIVE
SPEC GRAV UA: 1.015
UROBILINOGEN UA: 1

## 2014-08-17 NOTE — Progress Notes (Signed)
Pre visit review using our clinic review tool, if applicable. No additional management support is needed unless otherwise documented below in the visit note. 

## 2014-08-17 NOTE — Patient Instructions (Signed)
Return when necessary 

## 2014-08-17 NOTE — Progress Notes (Signed)
   Subjective:    Patient ID: Peter Wolfe, male    DOB: 1929-04-19, 80 y.o.   MRN: [id number]  HPI  Peter Wolfe is a 80 year old male who comes in today for evaluation recurrent prostatitis  We put him on long-term antibiotics which she's taken now for 6 weeks. He's back today and says he feels well he has no urinary complaints.  Urinalysis today normal  Review of Systems Review of systems negative    Objective:   Physical Exam  Well-developed well-nourished male no acute distress vital signs stable he is afebrile  Urinalysis normal      Assessment & Plan:  Recurrent prostatitis resolved male with long-term oral antibiotics////////// return when necessary

## 2014-09-25 ENCOUNTER — Encounter: Payer: Self-pay | Admitting: Family Medicine

## 2014-09-25 ENCOUNTER — Ambulatory Visit (INDEPENDENT_AMBULATORY_CARE_PROVIDER_SITE_OTHER): Payer: Medicare Other | Admitting: Family Medicine

## 2014-09-25 DIAGNOSIS — R300 Dysuria: Secondary | ICD-10-CM | POA: Diagnosis not present

## 2014-09-25 LAB — POCT URINALYSIS DIPSTICK
Bilirubin, UA: NEGATIVE
Glucose, UA: NEGATIVE
KETONES UA: NEGATIVE
Nitrite, UA: NEGATIVE
PH UA: 8.5
Protein, UA: NEGATIVE
RBC UA: NEGATIVE
Spec Grav, UA: 1.015
Urobilinogen, UA: 1

## 2014-09-25 MED ORDER — CIPROFLOXACIN HCL 500 MG PO TABS: 500.0000 mg | ORAL_TABLET | Freq: Two times a day (BID) | ORAL | Status: DC

## 2014-09-25 NOTE — Patient Instructions (Signed)

## 2014-09-25 NOTE — Progress Notes (Signed)
Pre visit review using our clinic review tool, if applicable. No additional management support is needed unless otherwise documented below in the visit note. 

## 2014-09-25 NOTE — Progress Notes (Signed)
   Subjective:    Patient ID: Peter Wolfe, male    DOB: 1929/05/06, 80 y.o.   MRN: [id number]  HPI Acute visit. Patient seen with one-day history of dysuria. He had some mild burning with urination which started yesterday. Denies any fever, chills, nausea, vomiting, gross hematuria, or any flank pain. He was seen this past September and treated with doxycycline and then subsequent Cipro for possible acute prostatitis. He denies any history of kidney stones. He has history of BPH. He remains on tamsulosin. No obstructive urinary symptoms.  Past Medical History  Diagnosis Date  . COPD (chronic obstructive pulmonary disease)   . Insomnia   . Panic attack   . Hyperlipidemia   . GERD (gastroesophageal reflux disease)   . Hypertension   . BPH (benign prostatic hyperplasia)    No past surgical history on file.  reports that he has never smoked. He does not have any smokeless tobacco history on file. He reports that he does not drink alcohol or use illicit drugs. family history is not on file. Allergies  Allergen Reactions  . Hydrocodone-Ibuprofen   . Influenza Virus Vacc Split Pf     REACTION: allergic to eggs      Review of Systems  Constitutional: Negative for fever and chills.  Gastrointestinal: Negative for nausea, vomiting and abdominal pain.  Genitourinary: Positive for dysuria. Negative for hematuria.       Objective:   Physical Exam  Constitutional: He appears well-developed and well-nourished.  Cardiovascular: Normal rate and regular rhythm.   Pulmonary/Chest: Effort normal and breath sounds normal. No respiratory distress. He has no wheezes. He has no rales.          Assessment & Plan:  Dysuria. Rule out UTI. Urine dipstick reveals 1+ leukocytes otherwise negative. Urine culture sent. Drink plenty of fluids. Start Cipro 500 mg twice a day for 7 days pending culture results

## 2014-09-27 LAB — URINE CULTURE: Colony Count: 60000

## 2014-11-16 ENCOUNTER — Encounter: Payer: Self-pay | Admitting: Family Medicine

## 2014-11-16 ENCOUNTER — Ambulatory Visit (INDEPENDENT_AMBULATORY_CARE_PROVIDER_SITE_OTHER): Payer: Medicare Other | Admitting: Family Medicine

## 2014-11-16 VITALS — BP 118/74 | HR 64 | Temp 97.8°F | Ht 67.0 in | Wt 152.1 lb

## 2014-11-16 DIAGNOSIS — R229 Localized swelling, mass and lump, unspecified: Secondary | ICD-10-CM | POA: Diagnosis not present

## 2014-11-16 DIAGNOSIS — L570 Actinic keratosis: Secondary | ICD-10-CM | POA: Diagnosis not present

## 2014-11-16 NOTE — Progress Notes (Signed)
Pre visit review using our clinic review tool, if applicable. No additional management support is needed unless otherwise documented below in the visit note. 

## 2014-11-16 NOTE — Progress Notes (Addendum)
HPI:  SKin bump on L leg: -noticed 1 month ago -getting smaller, mildy tender -denies: bleeding, oozing, redness, malaise, fevers -reports history of lipoma  Skin lesion: -R neck, scally recurring lesion -has dermatologist, has had LN2 in the past for similar -bleeding, pruritis, pain   ROS: See pertinent positives and negatives per HPI.  Past Medical History  Diagnosis Date  . COPD (chronic obstructive pulmonary disease)   . Insomnia   . Panic attack   . Hyperlipidemia   . GERD (gastroesophageal reflux disease)   . Hypertension   . BPH (benign prostatic hyperplasia)     No past surgical history on file.  No family history on file.  History   Social History  . Marital Status: Single    Spouse Name: N/A  . Number of Children: N/A  . Years of Education: N/A   Social History Main Topics  . Smoking status: Never Smoker   . Smokeless tobacco: Not on file  . Alcohol Use: No  . Drug Use: No  . Sexual Activity: Not on file   Other Topics Concern  . None   Social History Narrative     Current outpatient prescriptions:  .  busPIRone (BUSPAR) 30 MG tablet, Take 1 tablet (30 mg total) by mouth daily., Disp: 100 tablet, Rfl: 3 .  cloNIDine (CATAPRES) 0.1 MG tablet, TAKE 1 TABLET EVERY DAY, Disp: 100 tablet, Rfl: 3 .  FLUoxetine (PROZAC) 20 MG capsule, Take 1 capsule (20 mg total) by mouth daily., Disp: 100 capsule, Rfl: 3 .  Multiple Vitamin (MULTIVITAMIN) capsule, Take 1 capsule by mouth daily., Disp: , Rfl:  .  omeprazole (PRILOSEC) 20 MG capsule, TAKE ONE CAPSULE BY MOUTH EVERY DAY, Disp: 100 capsule, Rfl: 3 .  simvastatin (ZOCOR) 10 MG tablet, Take 1 tablet (10 mg total) by mouth at bedtime., Disp: 100 tablet, Rfl: 3 .  tamsulosin (FLOMAX) 0.4 MG CAPS capsule, Take 1 capsule (0.4 mg total) by mouth daily., Disp: 100 capsule, Rfl: 3 .  traZODone (DESYREL) 100 MG tablet, Take 1 tablet (100 mg total) by mouth at bedtime., Disp: 100 tablet, Rfl: 3 .   triamterene-hydrochlorothiazide (MAXZIDE) 75-50 MG per tablet, Take 1 tablet by mouth daily., Disp: 100 tablet, Rfl: 3  EXAM:  Filed Vitals:   11/16/14 1037  BP: 118/74  Pulse: 64  Temp: 97.8 F (36.6 C)    Body mass index is 23.82 kg/(m^2).  GENERAL: vitals reviewed and listed above, alert, oriented, appears well hydrated and in no acute distress  HEENT: atraumatic, conjunttiva clear, no obvious abnormalities on inspection of external nose and ears  NECK: no obvious masses on inspection  LUNGS: clear to auscultation bilaterally, no wheezes, rales or rhonchi, good air movement  CV: HRRR, no peripheral edema  SKIN: soft, mobile subcut nodule L shin aprox 1.37mm in diam, no overlying erythema, drainage, skin changes; AK R neck 63mm in diameter  MS: moves all extremities without noticeable abnormality  PSYCH: pleasant and cooperative, no obvious depression or anxiety  ASSESSMENT AND PLAN:  Discussed the following assessment and plan:  Subcutaneous mass -we discussed possible serious and likely etiologies, workup and treatment, treatment risks and return precautions - more likely benign cyst or lipoma, discussed unlikely but small possible chance neoplasm -after this discussion, Peter Wolfe opted for observation -follow up advised with PCP at routine follow up and yearly exam and of course, we advised Peter Wolfe  to return or notify a doctor immediately if lesion enlarging or change, symptoms worsen or persist  or new concerns arise.  AK (actinic keratosis) -discussed potential etiologies and pot for neoplasm -he opted for tx with LN2 and follow up with his dermatologist if not resolved in 1 month -procedure explain including risks, freeze thaw x3, wound care recs, tol well  -Patient advised to return or notify a doctor immediately if symptoms worsen or persist or new concerns arise.  Patient Instructions  Follow up with your dermatologist if lesion on neck not gone in 1  month  Follow up with your doctor for regular follow up and physicals and if any change or concern regarding the bump on your leg     Peter Wolfe R.

## 2014-11-16 NOTE — Patient Instructions (Addendum)
Follow up with Dr. Sherren Mocha or your dermatologist if lesion on neck not gone in 1 month  Follow up with your doctor for regular follow up and physicals and if any change or concern regarding the bump on your leg

## 2014-11-19 ENCOUNTER — Encounter: Payer: Self-pay | Admitting: Family Medicine

## 2015-02-01 ENCOUNTER — Encounter: Payer: Self-pay | Admitting: Family Medicine

## 2015-02-01 ENCOUNTER — Ambulatory Visit (INDEPENDENT_AMBULATORY_CARE_PROVIDER_SITE_OTHER): Payer: Medicare Other | Admitting: Family Medicine

## 2015-02-01 DIAGNOSIS — R300 Dysuria: Secondary | ICD-10-CM

## 2015-02-01 LAB — POCT URINALYSIS DIPSTICK
BILIRUBIN UA: NEGATIVE
GLUCOSE UA: NEGATIVE
Ketones, UA: NEGATIVE
Nitrite, UA: NEGATIVE
Spec Grav, UA: 1.015
Urobilinogen, UA: 1
pH, UA: 8.5

## 2015-02-01 MED ORDER — CIPROFLOXACIN HCL 500 MG PO TABS
500.0000 mg | ORAL_TABLET | Freq: Two times a day (BID) | ORAL | Status: DC
Start: 2015-02-01 — End: 2015-07-31

## 2015-02-01 NOTE — Progress Notes (Signed)
Pre visit review using our clinic review tool, if applicable. No additional management support is needed unless otherwise documented below in the visit note. 

## 2015-02-01 NOTE — Patient Instructions (Signed)
Urinary Tract Infection Urinary tract infections (UTIs) can develop anywhere along your urinary tract. Your urinary tract is your body's drainage system for removing wastes and extra water. Your urinary tract includes two kidneys, two ureters, a bladder, and a urethra. Your kidneys are a pair of bean-shaped organs. Each kidney is about the size of your fist. They are located below your ribs, one on each side of your spine. CAUSES Infections are caused by microbes, which are microscopic organisms, including fungi, viruses, and bacteria. These organisms are so small that they can only be seen through a microscope. Bacteria are the microbes that most commonly cause UTIs. SYMPTOMS  Symptoms of UTIs may vary by age and gender of the patient and by the location of the infection. Symptoms in young women typically include a frequent and intense urge to urinate and a painful, burning feeling in the bladder or urethra during urination. Older women and men are more likely to be tired, shaky, and weak and have muscle aches and abdominal pain. A fever may mean the infection is in your kidneys. Other symptoms of a kidney infection include pain in your back or sides below the ribs, nausea, and vomiting. DIAGNOSIS To diagnose a UTI, your caregiver will ask you about your symptoms. Your caregiver also will ask to provide a urine sample. The urine sample will be tested for bacteria and white blood cells. White blood cells are made by your body to help fight infection. TREATMENT  Typically, UTIs can be treated with medication. Because most UTIs are caused by a bacterial infection, they usually can be treated with the use of antibiotics. The choice of antibiotic and length of treatment depend on your symptoms and the type of bacteria causing your infection. HOME CARE INSTRUCTIONS  If you were prescribed antibiotics, take them exactly as your caregiver instructs you. Finish the medication even if you feel better after you  have only taken some of the medication.  Drink enough water and fluids to keep your urine clear or pale yellow.  Avoid caffeine, tea, and carbonated beverages. They tend to irritate your bladder.  Empty your bladder often. Avoid holding urine for long periods of time.  Empty your bladder before and after sexual intercourse.  After a bowel movement, women should cleanse from front to back. Use each tissue only once. SEEK MEDICAL CARE IF:   You have back pain.  You develop a fever.  Your symptoms do not begin to resolve within 3 days. SEEK IMMEDIATE MEDICAL CARE IF:   You have severe back pain or lower abdominal pain.  You develop chills.  You have nausea or vomiting.  You have continued burning or discomfort with urination. MAKE SURE YOU:   Understand these instructions.  Will watch your condition.  Will get help right away if you are not doing well or get worse. Document Released: 06/25/2005 Document Revised: 03/16/2012 Document Reviewed: 10/24/2011 [facility] Patient Information 2015 [place], [place]. This information is not intended to replace advice given to you by your health care provider. Make sure you discuss any questions you have with your health care provider.  Consider over the counter Pyridium (Azostandard)

## 2015-02-01 NOTE — Progress Notes (Signed)
   Subjective:    Patient ID: Peter Wolfe, male    DOB: 05-28-1929, 81 y.o.   MRN: [id number]  HPI Acute visit. Patient does with a few day history of burning with urination. Symptoms usually worse early morning and then slowly get better. Denies any fevers or chills. No nausea or vomiting. No flank pain. No abdominal pain. No history of prostatitis. No known antibiotic allergies. He has history of BPH on Flomax with no recent obstructive symptoms.  Past Medical History  Diagnosis Date  . COPD (chronic obstructive pulmonary disease)   . Insomnia   . Panic attack   . Hyperlipidemia   . GERD (gastroesophageal reflux disease)   . Hypertension   . BPH (benign prostatic hyperplasia)    No past surgical history on file.  reports that he has never smoked. He does not have any smokeless tobacco history on file. He reports that he does not drink alcohol or use illicit drugs. family history is not on file. Allergies  Allergen Reactions  . Hydrocodone-Ibuprofen   . Influenza Virus Vacc Split Pf     REACTION: allergic to eggs      Review of Systems  Constitutional: Negative for fever and chills.  Gastrointestinal: Negative for abdominal pain.  Genitourinary: Positive for dysuria. Negative for hematuria and decreased urine volume.       Objective:   Physical Exam  Constitutional: He appears well-developed and well-nourished.  Cardiovascular: Normal rate and regular rhythm.   Pulmonary/Chest: Effort normal and breath sounds normal. No respiratory distress. He has no wheezes. He has no rales.          Assessment & Plan:  Dysuria. Suspect UTI based on urine dipstick. Urine culture sent. Cipro 500 mg twice a day for 7 days pending culture results. Patient encouraged to stay well-hydrated

## 2015-02-04 LAB — URINE CULTURE: Colony Count: >=100000

## 2015-05-26 ENCOUNTER — Other Ambulatory Visit: Payer: Self-pay | Admitting: Family Medicine

## 2015-06-04 ENCOUNTER — Other Ambulatory Visit: Payer: Self-pay | Admitting: Family Medicine

## 2015-06-22 ENCOUNTER — Other Ambulatory Visit: Payer: Self-pay | Admitting: Family Medicine

## 2015-07-31 ENCOUNTER — Encounter: Payer: Self-pay | Admitting: Family Medicine

## 2015-07-31 ENCOUNTER — Ambulatory Visit (INDEPENDENT_AMBULATORY_CARE_PROVIDER_SITE_OTHER): Payer: Medicare Other | Admitting: Family Medicine

## 2015-07-31 VITALS — BP 138/90 | Temp 97.7°F | Ht 66.0 in | Wt 146.0 lb

## 2015-07-31 DIAGNOSIS — G4700 Insomnia, unspecified: Secondary | ICD-10-CM

## 2015-07-31 DIAGNOSIS — N400 Enlarged prostate without lower urinary tract symptoms: Secondary | ICD-10-CM

## 2015-07-31 DIAGNOSIS — R5383 Other fatigue: Secondary | ICD-10-CM | POA: Diagnosis not present

## 2015-07-31 DIAGNOSIS — K219 Gastro-esophageal reflux disease without esophagitis: Secondary | ICD-10-CM

## 2015-07-31 DIAGNOSIS — F410 Panic disorder [episodic paroxysmal anxiety] without agoraphobia: Secondary | ICD-10-CM

## 2015-07-31 DIAGNOSIS — E785 Hyperlipidemia, unspecified: Secondary | ICD-10-CM

## 2015-07-31 DIAGNOSIS — I10 Essential (primary) hypertension: Secondary | ICD-10-CM

## 2015-07-31 DIAGNOSIS — R350 Frequency of micturition: Secondary | ICD-10-CM

## 2015-07-31 LAB — CBC WITH DIFFERENTIAL/PLATELET
Basophils Absolute: 0 10*3/uL (ref 0.0–0.1)
Basophils Relative: 0.4 % (ref 0.0–3.0)
EOS PCT: 2.2 % (ref 0.0–5.0)
Eosinophils Absolute: 0.1 10*3/uL (ref 0.0–0.7)
HEMATOCRIT: 37.5 % — AB (ref 39.0–52.0)
Hemoglobin: 12.8 g/dL — ABNORMAL LOW (ref 13.0–17.0)
LYMPHS ABS: 1.4 10*3/uL (ref 0.7–4.0)
Lymphocytes Relative: 24.8 % (ref 12.0–46.0)
MCHC: 34.1 g/dL (ref 30.0–36.0)
MCV: 103.7 fl — AB (ref 78.0–100.0)
MONO ABS: 0.6 10*3/uL (ref 0.1–1.0)
Monocytes Relative: 9.7 % (ref 3.0–12.0)
NEUTROS ABS: 3.6 10*3/uL (ref 1.4–7.7)
Neutrophils Relative %: 62.9 % (ref 43.0–77.0)
PLATELETS: 172 10*3/uL (ref 150.0–400.0)
RBC: 3.62 Mil/uL — AB (ref 4.22–5.81)
RDW: 14 % (ref 11.5–15.5)
WBC: 5.7 10*3/uL (ref 4.0–10.5)

## 2015-07-31 LAB — PSA: PSA: 1.96 ng/mL (ref 0.10–4.00)

## 2015-07-31 LAB — LIPID PANEL
CHOLESTEROL: 152 mg/dL (ref 0–200)
HDL: 51 mg/dL (ref >39.00)
LDL CALC: 75 mg/dL (ref 0–99)
NonHDL: 101.26
TRIGLYCERIDES: 129 mg/dL (ref 0.0–149.0)
Total CHOL/HDL Ratio: 3
VLDL: 25.8 mg/dL (ref 0.0–40.0)

## 2015-07-31 LAB — BASIC METABOLIC PANEL
BUN: 19 mg/dL (ref 6–23)
CHLORIDE: 103 meq/L (ref 96–112)
CO2: 31 meq/L (ref 19–32)
CREATININE: 1.38 mg/dL (ref 0.40–1.50)
Calcium: 9.6 mg/dL (ref 8.4–10.5)
GFR: 52.12 mL/min — ABNORMAL LOW (ref >60.00)
GLUCOSE: 110 mg/dL — AB (ref 70–99)
Potassium: 4.2 mEq/L (ref 3.5–5.1)
Sodium: 141 mEq/L (ref 135–145)

## 2015-07-31 LAB — POCT URINALYSIS DIPSTICK
BILIRUBIN UA: NEGATIVE
GLUCOSE UA: NEGATIVE
Ketones, UA: NEGATIVE
NITRITE UA: NEGATIVE
Protein, UA: NEGATIVE
Spec Grav, UA: 1.015
UROBILINOGEN UA: 1
pH, UA: 7

## 2015-07-31 LAB — HEPATIC FUNCTION PANEL
ALBUMIN: 3.9 g/dL (ref 3.5–5.2)
ALK PHOS: 40 U/L (ref 39–117)
ALT: 10 U/L (ref 0–53)
AST: 11 U/L (ref 0–37)
BILIRUBIN DIRECT: -0.2 mg/dL — AB (ref 0.0–0.3)
BILIRUBIN TOTAL: 1 mg/dL (ref 0.2–1.2)
Total Protein: 8.3 g/dL (ref 6.0–8.3)

## 2015-07-31 LAB — TSH: TSH: 1.28 u[IU]/mL (ref 0.35–4.50)

## 2015-07-31 MED ORDER — SIMVASTATIN 10 MG PO TABS: 10.0000 mg | ORAL_TABLET | Freq: Every day | ORAL | Status: DC

## 2015-07-31 MED ORDER — CLONIDINE HCL 0.1 MG PO TABS: 0.1000 mg | ORAL_TABLET | Freq: Every day | ORAL | Status: DC

## 2015-07-31 MED ORDER — TAMSULOSIN HCL 0.4 MG PO CAPS: 0.4000 mg | ORAL_CAPSULE | Freq: Every day | ORAL | Status: DC

## 2015-07-31 MED ORDER — BUSPIRONE HCL 30 MG PO TABS: 30.0000 mg | ORAL_TABLET | Freq: Every day | ORAL | Status: DC

## 2015-07-31 MED ORDER — TRIAMTERENE-HCTZ 75-50 MG PO TABS: 1.0000 | ORAL_TABLET | Freq: Every day | ORAL | Status: DC

## 2015-07-31 MED ORDER — TRAZODONE HCL 100 MG PO TABS: 100.0000 mg | ORAL_TABLET | Freq: Every day | ORAL | Status: DC

## 2015-07-31 MED ORDER — FLUOXETINE HCL 20 MG PO CAPS: 20.0000 mg | ORAL_CAPSULE | Freq: Every day | ORAL | Status: DC

## 2015-07-31 MED ORDER — OMEPRAZOLE 20 MG PO CPDR: 20.0000 mg | DELAYED_RELEASE_CAPSULE | Freq: Every day | ORAL | Status: DC

## 2015-07-31 NOTE — Patient Instructions (Signed)
Continue current medications diet and exercise  Labs today  We will call you the report  Tommi Rumps or Almyra Free........... are 2 new adult nurse practitioner's  When you call in July for your physical examination in November pick one of those folks for your physical and get established for long-term care

## 2015-07-31 NOTE — Progress Notes (Signed)
Subjective:    Patient ID: Peter Wolfe, male    DOB: [birth year], 81 y.o.   MRN: [id number]  HPI Peter Wolfe is a 81 year old single male nonsmoker who comes in today for annual physical examination because of a history of insomnia, mild hypertension, reflux esophagitis, hyperlipidemia, BPH with outlet obstruction, and mild fluid retention  He gets routine eye care, dental care, colonoscopy 2011 normal.  He says the past couple months she's felt tired. In June he drove to [place] to see family members. He was so tired after that he couldn't drive home. He flew home and they have the ship his car home. His weight is steady at 148. His sleep is normal no fever chills or night sweats  Medications unchanged  Vaccination history up-to-date he has allergic reaction to eggs therefore does not get the flu vaccine  Cognitive function normal he exercises daily home health safety reviewed no issues identified, no guns in the house, he does have a healthcare power of attorney and living well  Social history......Marland Kitchen very involved with his church he spends most of his free time there.   Review of Systems  Constitutional: Positive for fatigue.  HENT: Negative.   Eyes: Negative.   Respiratory: Negative.   Cardiovascular: Negative.   Gastrointestinal: Negative.   Endocrine: Negative.   Genitourinary: Negative.   Musculoskeletal: Negative.   Skin: Negative.   Allergic/Immunologic: Negative.   Neurological: Negative.   Hematological: Negative.   Psychiatric/Behavioral: Negative.        Objective:   Physical Exam  Constitutional: He is oriented to person, place, and time. He appears well-developed and well-nourished.  HENT:  Head: Normocephalic and atraumatic.  Right Ear: External ear normal.  Left Ear: External ear normal.  Nose: Nose normal.  Mouth/Throat: Oropharynx is clear and moist.  Eyes: Conjunctivae and EOM are normal. Pupils are equal, round, and reactive to light.  Neck:  Normal range of motion. Neck supple. No JVD present. No tracheal deviation present. No thyromegaly present.  Cardiovascular: Normal rate, regular rhythm, normal heart sounds and intact distal pulses.  Exam reveals no gallop and no friction rub.   No murmur heard. Pulmonary/Chest: Effort normal and breath sounds normal. No stridor. No respiratory distress. He has no wheezes. He has no rales. He exhibits no tenderness.  Abdominal: Soft. Bowel sounds are normal. He exhibits no distension and no mass. There is no tenderness. There is no rebound and no guarding.  Genitourinary: Rectum normal, prostate normal and penis normal. Guaiac negative stool. No penile tenderness.  Musculoskeletal: Normal range of motion. He exhibits no edema or tenderness.  Lymphadenopathy:    He has no cervical adenopathy.  Neurological: He is alert and oriented to person, place, and time. He has normal reflexes. No cranial nerve deficit. He exhibits normal muscle tone.  Skin: Skin is warm and dry. No rash noted. No erythema. No pallor.  Psychiatric: He has a normal mood and affect. His behavior is normal. Judgment and thought content normal.  Nursing note and vitals reviewed.  He does have a enlarged lesion right lower abdomen appears to be a actinic keratosis. Advised return next week for removal. No carotid or bruits peripheral pulses 2+ and symmetrical       Assessment & Plan:  Healthy male  Hypertension at goal......... continue current therapy  Insomnia..... Continue current therapy  History of mild depression.....Marland Kitchen continue current therapy  Hyperlipidemia....... continue Zocor check labs  BPH with lead obstruction....... continue Flomax chest PSA  Fatigue.......Marland Kitchen  etiology unknown workup

## 2015-07-31 NOTE — Progress Notes (Signed)
Pre visit review using our clinic review tool, if applicable. No additional management support is needed unless otherwise documented below in the visit note. 

## 2015-08-02 ENCOUNTER — Other Ambulatory Visit: Payer: Self-pay | Admitting: Family Medicine

## 2015-08-02 MED ORDER — DOXYCYCLINE HYCLATE 100 MG PO TABS: 100.0000 mg | ORAL_TABLET | Freq: Two times a day (BID) | ORAL | Status: DC

## 2015-08-13 ENCOUNTER — Ambulatory Visit (INDEPENDENT_AMBULATORY_CARE_PROVIDER_SITE_OTHER): Payer: Medicare Other | Admitting: Family Medicine

## 2015-08-13 DIAGNOSIS — L820 Inflamed seborrheic keratosis: Secondary | ICD-10-CM | POA: Diagnosis not present

## 2015-08-13 DIAGNOSIS — B078 Other viral warts: Secondary | ICD-10-CM | POA: Diagnosis not present

## 2015-08-13 DIAGNOSIS — B079 Viral wart, unspecified: Secondary | ICD-10-CM

## 2015-08-13 DIAGNOSIS — L570 Actinic keratosis: Secondary | ICD-10-CM

## 2015-08-13 NOTE — Addendum Note (Signed)
Addended by: Westley Hummer B on: 08/13/2015 10:50 AM   Modules accepted: Orders

## 2015-08-13 NOTE — Patient Instructions (Signed)
Within 2 weeks we will call you the report 

## 2015-08-13 NOTE — Progress Notes (Signed)
   Subjective:    Patient ID: Peter Wolfe, male    DOB: [birth year], 81 y.o.   MRN: [id number]  HPI Peter Wolfe is a 81 year old single male nonsmoker who comes in today for move of 2 lesions  Lesion #1  is a 55mm by 61mm lesion the right side of his scalp just above the year it's brown and red  Lesion #2 is a 10 mm x 10 mm red lesion right upper quadrant the abdomen. It's red irritated and elevated consistent with an irritated actinic keratosis  After informed consent both lesions were anesthetized with 1% Xylocaine with epinephrine and remove a 2 mm margins. The bases were cauterized Band-Aid was applied to the lesion on his abdomen the lesion on the scalp was left alone  Both lesions were sent for pathologic analysis.  The scalp lesion appears to be a scab rate keratosis irritated and the lesion on the right upper quadrant appears to be an actinic keratosis with marked irritation path pending   Review of Systems Review of systems otherwise negative    Objective:   Physical Exam  Well-developed well-nourished male no acute distress vital signs stable he is afebrile  Procedures above        Assessment & Plan:  Lesion #1.......Marland Kitchen 8 mm x 8 mm of the scalp.....Marland Kitchen clinically appears to be a seborrheic keratosis..... Path pending  Lesion #2........Marland Kitchen 10 mm x 10 mm right upper quadrant the abdomen.....Marland Kitchen clinically appears to be an irritated actinic keratosis...... path pending  Procedures as above

## 2015-09-12 ENCOUNTER — Ambulatory Visit (INDEPENDENT_AMBULATORY_CARE_PROVIDER_SITE_OTHER): Payer: Medicare Other | Admitting: Adult Health

## 2015-09-12 ENCOUNTER — Encounter: Payer: Self-pay | Admitting: Adult Health

## 2015-09-12 VITALS — BP 102/70 | Temp 97.9°F | Ht 66.0 in | Wt 147.0 lb

## 2015-09-12 DIAGNOSIS — T148 Other injury of unspecified body region: Secondary | ICD-10-CM

## 2015-09-12 DIAGNOSIS — T148XXA Other injury of unspecified body region, initial encounter: Secondary | ICD-10-CM

## 2015-09-12 NOTE — Progress Notes (Signed)
Pre visit review using our clinic review tool, if applicable. No additional management support is needed unless otherwise documented below in the visit note. 

## 2015-09-12 NOTE — Progress Notes (Signed)
Subjective:    Patient ID: Peter Wolfe, male    DOB: [birth year], 81 y.o.   MRN: [id number]  HPI  81 year old male who presents to the office today for follow up regarding hematoma on his right leg.  He has had this hematoma for approx one month after hitting his leg on a book case. He continues to have pain with palpation and when walking. The hematoma is decreasing in size.   Denies any signs of infection.  Review of Systems  Constitutional: Negative.   Respiratory: Negative.   Cardiovascular: Negative.   Skin: Positive for color change.       hematoma  All other systems reviewed and are negative.  Past Medical History  Diagnosis Date  . COPD (chronic obstructive pulmonary disease) ([place])   . Insomnia   . Panic attack   . Hyperlipidemia   . GERD (gastroesophageal reflux disease)   . Hypertension   . BPH (benign prostatic hyperplasia)     Social History   Social History  . Marital Status: Single    Spouse Name: N/A  . Number of Children: N/A  . Years of Education: N/A   Occupational History  . Not on file.   Social History Main Topics  . Smoking status: Never Smoker   . Smokeless tobacco: Not on file  . Alcohol Use: No  . Drug Use: No  . Sexual Activity: Not on file   Other Topics Concern  . Not on file   Social History Narrative    No past surgical history on file.  No family history on file.  Allergies  Allergen Reactions  . Hydrocodone-Ibuprofen   . Influenza Virus Vacc Split Pf     REACTION: allergic to eggs  . Doxycycline Rash  . Eggs Or Egg-Derived Products Rash    Current Outpatient Prescriptions on File Prior to Visit  Medication Sig Dispense Refill  . busPIRone (BUSPAR) 30 MG tablet Take 1 tablet (30 mg total) by mouth daily. 100 tablet 3  . cloNIDine (CATAPRES) 0.1 MG tablet Take 1 tablet (0.1 mg total) by mouth daily. 100 tablet 3  . FLUoxetine (PROZAC) 20 MG capsule Take 1 capsule (20 mg total) by mouth daily. 100 capsule 3    . Multiple Vitamin (MULTIVITAMIN) capsule Take 1 capsule by mouth daily.    Marland Kitchen omeprazole (PRILOSEC) 20 MG capsule Take 1 capsule (20 mg total) by mouth daily. 100 capsule 3  . simvastatin (ZOCOR) 10 MG tablet Take 1 tablet (10 mg total) by mouth at bedtime. 100 tablet 3  . tamsulosin (FLOMAX) 0.4 MG CAPS capsule Take 1 capsule (0.4 mg total) by mouth daily. 100 capsule 3  . traZODone (DESYREL) 100 MG tablet Take 1 tablet (100 mg total) by mouth at bedtime. 100 tablet 3  . triamterene-hydrochlorothiazide (MAXZIDE) 75-50 MG tablet Take 1 tablet by mouth daily. 100 tablet 3   No current facility-administered medications on file prior to visit.    BP 102/70 mmHg  Temp(Src) 97.9 F (36.6 C) (Oral)  Ht 5\' 6"  (1.676 m)  Wt 147 lb (66.679 kg)  BMI 23.74 kg/m2       Objective:   Physical Exam  Constitutional: He is oriented to person, place, and time. He appears well-developed and well-nourished. No distress.  Cardiovascular: Normal rate, regular rhythm, normal heart sounds and intact distal pulses.  Exam reveals no gallop and no friction rub.   No murmur heard. Pulmonary/Chest: Effort normal and breath sounds normal. No respiratory  distress. He has no wheezes.  Musculoskeletal: Normal range of motion. He exhibits no edema or tenderness.  Neurological: He is alert and oriented to person, place, and time.  Skin: Skin is warm and dry. He is not diaphoretic.  Silver dollar sized hematoma to right leg. Bruising noted  in various stages of healing. No redness or warmth  Psychiatric: He has a normal mood and affect. His behavior is normal. Judgment and thought content normal.  Nursing note and vitals reviewed.      Assessment & Plan:  1. Hematoma - Ice and ibuprofen.  - Elevate leg above heart - Follow up if no improvement

## 2015-09-12 NOTE — Patient Instructions (Addendum)
It was great meeting you today!  For the next three days, take ibuprofen 400 mg ( two pills) every 8 hours.   Also apply ice to the site as much as you can.     Hematoma A hematoma is a collection of blood under the skin, in an organ, in a body space, in a joint space, or in other tissue. The blood can clot to form a lump that you can see and feel. The lump is often firm and may sometimes become sore and tender. Most hematomas get better in a few days to weeks. However, some hematomas may be serious and require medical care. Hematomas can range in size from very small to very large. CAUSES  A hematoma can be caused by a blunt or penetrating injury. It can also be caused by spontaneous leakage from a blood vessel under the skin. Spontaneous leakage from a blood vessel is more likely to occur in older people, especially those taking blood thinners. Sometimes, a hematoma can develop after certain medical procedures. SIGNS AND SYMPTOMS   A firm lump on the body.  Possible pain and tenderness in the area.  Bruising.Blue, dark blue, purple-red, or yellowish skin may appear at the site of the hematoma if the hematoma is close to the surface of the skin. For hematomas in deeper tissues or body spaces, the signs and symptoms may be subtle. For example, an intra-abdominal hematoma may cause abdominal pain, weakness, fainting, and shortness of breath. An intracranial hematoma may cause a headache or symptoms such as weakness, trouble speaking, or a change in consciousness. DIAGNOSIS  A hematoma can usually be diagnosed based on your medical history and a physical exam. Imaging tests may be needed if your health care provider suspects a hematoma in deeper tissues or body spaces, such as the abdomen, head, or chest. These tests may include ultrasonography or a CT scan.  TREATMENT  Hematomas usually go away on their own over time. Rarely does the blood need to be drained out of the body. Large hematomas or  those that may affect vital organs will sometimes need surgical drainage or monitoring. HOME CARE INSTRUCTIONS   Apply ice to the injured area:   Put ice in a plastic bag.   Place a towel between your skin and the bag.   Leave the ice on for 20 minutes, 2-3 times a day for the first 1 to 2 days.   After the first 2 days, switch to using warm compresses on the hematoma.   Elevate the injured area to help decrease pain and swelling. Wrapping the area with an elastic bandage may also be helpful. Compression helps to reduce swelling and promotes shrinking of the hematoma. Make sure the bandage is not wrapped too tight.   If your hematoma is on a lower extremity and is painful, crutches may be helpful for a couple days.   Only take over-the-counter or prescription medicines as directed by your health care provider. SEEK IMMEDIATE MEDICAL CARE IF:   You have increasing pain, or your pain is not controlled with medicine.   You have a fever.   You have worsening swelling or discoloration.   Your skin over the hematoma breaks or starts bleeding.   Your hematoma is in your chest or abdomen and you have weakness, shortness of breath, or a change in consciousness.  Your hematoma is on your scalp (caused by a fall or injury) and you have a worsening headache or a change in alertness  or consciousness. MAKE SURE YOU:   Understand these instructions.  Will watch your condition.  Will get help right away if you are not doing well or get worse.   This information is not intended to replace advice given to you by your health care provider. Make sure you discuss any questions you have with your health care provider.   Document Released: 04/29/2004 Document Revised: 05/18/2013 Document Reviewed: 02/23/2013 Elsevier Interactive Patient Education [employer].

## 2015-10-04 ENCOUNTER — Emergency Department (HOSPITAL_COMMUNITY)
Admission: EM | Admit: 2015-10-04 | Discharge: 2015-10-04 | Disposition: A | Discharge status: Home / Self Care | Payer: Medicare Other | Attending: Emergency Medicine | Admitting: Emergency Medicine

## 2015-10-04 ENCOUNTER — Emergency Department (HOSPITAL_COMMUNITY): Payer: Medicare Other

## 2015-10-04 ENCOUNTER — Telehealth: Payer: Self-pay | Admitting: Family Medicine

## 2015-10-04 ENCOUNTER — Encounter (HOSPITAL_COMMUNITY): Payer: Self-pay | Admitting: Emergency Medicine

## 2015-10-04 DIAGNOSIS — E785 Hyperlipidemia, unspecified: Secondary | ICD-10-CM | POA: Diagnosis not present

## 2015-10-04 DIAGNOSIS — Y9389 Activity, other specified: Secondary | ICD-10-CM | POA: Insufficient documentation

## 2015-10-04 DIAGNOSIS — J449 Chronic obstructive pulmonary disease, unspecified: Secondary | ICD-10-CM | POA: Diagnosis not present

## 2015-10-04 DIAGNOSIS — S2231XA Fracture of one rib, right side, initial encounter for closed fracture: Secondary | ICD-10-CM | POA: Insufficient documentation

## 2015-10-04 DIAGNOSIS — F410 Panic disorder [episodic paroxysmal anxiety] without agoraphobia: Secondary | ICD-10-CM | POA: Diagnosis not present

## 2015-10-04 DIAGNOSIS — G4700 Insomnia, unspecified: Secondary | ICD-10-CM | POA: Insufficient documentation

## 2015-10-04 DIAGNOSIS — K219 Gastro-esophageal reflux disease without esophagitis: Secondary | ICD-10-CM | POA: Insufficient documentation

## 2015-10-04 DIAGNOSIS — N400 Enlarged prostate without lower urinary tract symptoms: Secondary | ICD-10-CM | POA: Diagnosis not present

## 2015-10-04 DIAGNOSIS — Y998 Other external cause status: Secondary | ICD-10-CM | POA: Diagnosis not present

## 2015-10-04 DIAGNOSIS — Y92410 Unspecified street and highway as the place of occurrence of the external cause: Secondary | ICD-10-CM | POA: Insufficient documentation

## 2015-10-04 DIAGNOSIS — S299XXA Unspecified injury of thorax, initial encounter: Secondary | ICD-10-CM | POA: Diagnosis present

## 2015-10-04 DIAGNOSIS — Z79899 Other long term (current) drug therapy: Secondary | ICD-10-CM | POA: Diagnosis not present

## 2015-10-04 MED ORDER — HYDROCODONE-ACETAMINOPHEN 5-325 MG PO TABS: 1.0000 | ORAL_TABLET | ORAL | Status: DC | PRN

## 2015-10-04 MED ORDER — HYDROCODONE-ACETAMINOPHEN 5-325 MG PO TABS
1.0000 | ORAL_TABLET | Freq: Once | ORAL | Status: AC
  Administered 2015-10-04: 1 via ORAL
  Filled 2015-10-04: qty 1

## 2015-10-04 NOTE — Telephone Encounter (Signed)
noted 

## 2015-10-04 NOTE — Telephone Encounter (Signed)
Noted. Patient went to ER.
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
## 2015-10-04 NOTE — Discharge Instructions (Signed)
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
## 2015-10-04 NOTE — Telephone Encounter (Signed)
Patient Name: DAISUKE ELM  DOB: 06/16/1929    Initial Comment Caller states he is having severe chest pain.   Nurse Assessment      Guidelines    Guideline Title Affirmed Question Affirmed Notes  Chest Injury SEVERE chest pain    Final Disposition User   Go to ED Now Orvan Seen, RN, [place]    Referrals  [facility] - ED   Disagree/Comply: Comply

## 2015-11-28 ENCOUNTER — Encounter: Payer: Self-pay | Admitting: Family Medicine

## 2015-11-28 ENCOUNTER — Ambulatory Visit (INDEPENDENT_AMBULATORY_CARE_PROVIDER_SITE_OTHER): Payer: Medicare Other | Admitting: Family Medicine

## 2015-11-28 VITALS — BP 120/70 | Temp 98.2°F | Wt 147.0 lb

## 2015-11-28 DIAGNOSIS — Z8781 Personal history of (healed) traumatic fracture: Secondary | ICD-10-CM | POA: Diagnosis not present

## 2015-11-28 DIAGNOSIS — Z87898 Personal history of other specified conditions: Secondary | ICD-10-CM

## 2015-11-28 NOTE — Progress Notes (Signed)
Pre visit review using our clinic review tool, if applicable. No additional management support is needed unless otherwise documented below in the visit note. 
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
## 2015-11-28 NOTE — Patient Instructions (Signed)
Set up a time the spring to get established for long-term care with one of our new folks........... Tommi Rumps or [place].......... are adult nurse practitioner's or Dr. Martinique

## 2016-02-12 ENCOUNTER — Encounter: Payer: Self-pay | Admitting: Adult Health

## 2016-02-12 ENCOUNTER — Ambulatory Visit (INDEPENDENT_AMBULATORY_CARE_PROVIDER_SITE_OTHER): Payer: Medicare Other | Admitting: Adult Health

## 2016-02-12 VITALS — BP 132/70 | HR 85 | Temp 98.7°F | Ht 66.0 in | Wt 149.0 lb

## 2016-02-12 DIAGNOSIS — E785 Hyperlipidemia, unspecified: Secondary | ICD-10-CM

## 2016-02-12 DIAGNOSIS — Z7189 Other specified counseling: Secondary | ICD-10-CM

## 2016-02-12 DIAGNOSIS — I10 Essential (primary) hypertension: Secondary | ICD-10-CM

## 2016-02-12 DIAGNOSIS — Z7689 Persons encountering health services in other specified circumstances: Secondary | ICD-10-CM

## 2016-02-12 DIAGNOSIS — Z8781 Personal history of (healed) traumatic fracture: Secondary | ICD-10-CM | POA: Diagnosis not present

## 2016-02-12 NOTE — Progress Notes (Signed)
Pre visit review using our clinic review tool, if applicable. No additional management support is needed unless otherwise documented below in the visit note. 

## 2016-02-12 NOTE — Patient Instructions (Addendum)
It was great seeing you again today!  Please follow up with me in November for your next physical.   If you need anything in the meantime, please let me know  You can take Motrin 400 mg as needed for the rib pain.    Health Maintenance, Male A healthy lifestyle and preventative care can promote health and wellness.  Maintain regular health, dental, and eye exams.  Eat a healthy diet. Foods like vegetables, fruits, whole grains, low-fat dairy products, and lean protein foods contain the nutrients you need and are low in calories. Decrease your intake of foods high in solid fats, added sugars, and salt. Get information about a proper diet from your health care provider, if necessary.  Regular physical exercise is one of the most important things you can do for your health. Most adults should get at least 150 minutes of moderate-intensity exercise (any activity that increases your heart rate and causes you to sweat) each week. In addition, most adults need muscle-strengthening exercises on 2 or more days a week.   Maintain a healthy weight. The body mass index (BMI) is a screening tool to identify possible weight problems. It provides an estimate of body fat based on height and weight. Your health care provider can find your BMI and can help you achieve or maintain a healthy weight. For males 20 years and older:  A BMI below 18.5 is considered underweight.  A BMI of 18.5 to 24.9 is normal.  A BMI of 25 to 29.9 is considered overweight.  A BMI of 30 and above is considered obese.  Maintain normal blood lipids and cholesterol by exercising and minimizing your intake of saturated fat. Eat a balanced diet with plenty of fruits and vegetables. Blood tests for lipids and cholesterol should begin at age 67 and be repeated every 5 years. If your lipid or cholesterol levels are high, you are over age 6, or you are at high risk for heart disease, you may need your cholesterol levels checked more  frequently.Ongoing high lipid and cholesterol levels should be treated with medicines if diet and exercise are not working.  If you smoke, find out from your health care provider how to quit. If you do not use tobacco, do not start.  Lung cancer screening is recommended for adults aged 50-80 years who are at high risk for developing lung cancer because of a history of smoking. A yearly low-dose CT scan of the lungs is recommended for people who have at least a 30-pack-year history of smoking and are current smokers or have quit within the past 15 years. A pack year of smoking is smoking an average of 1 pack of cigarettes a day for 1 year (for example, a 30-pack-year history of smoking could mean smoking 1 pack a day for 30 years or 2 packs a day for 15 years). Yearly screening should continue until the smoker has stopped smoking for at least 15 years. Yearly screening should be stopped for people who develop a health problem that would prevent them from having lung cancer treatment.  If you choose to drink alcohol, do not have more than 2 drinks per day. One drink is considered to be 12 oz (360 mL) of beer, 5 oz (150 mL) of wine, or 1.5 oz (45 mL) of liquor.  Avoid the use of street drugs. Do not share needles with anyone. Ask for help if you need support or instructions about stopping the use of drugs.  High blood pressure  causes heart disease and increases the risk of stroke. High blood pressure is more likely to develop in:  People who have blood pressure in the end of the normal range (100-139/85-89 mm Hg).  People who are overweight or obese.  People who are African American.  If you are 12-16 years of age, have your blood pressure checked every 3-5 years. If you are 33 years of age or older, have your blood pressure checked every year. You should have your blood pressure measured twice--once when you are at a hospital or clinic, and once when you are not at a hospital or clinic. Record the  average of the two measurements. To check your blood pressure when you are not at a hospital or clinic, you can use:  An automated blood pressure machine at a pharmacy.  A home blood pressure monitor.  If you are 32-52 years old, ask your health care provider if you should take aspirin to prevent heart disease.  Diabetes screening involves taking a blood sample to check your fasting blood sugar level. This should be done once every 3 years after age 10 if you are at a normal weight and without risk factors for diabetes. Testing should be considered at a younger age or be carried out more frequently if you are overweight and have at least 1 risk factor for diabetes.  Colorectal cancer can be detected and often prevented. Most routine colorectal cancer screening begins at the age of 24 and continues through age 1. However, your health care provider may recommend screening at an earlier age if you have risk factors for colon cancer. On a yearly basis, your health care provider may provide home test kits to check for hidden blood in the stool. A small camera at the end of a tube may be used to directly examine the colon (sigmoidoscopy or colonoscopy) to detect the earliest forms of colorectal cancer. Talk to your health care provider about this at age 32 when routine screening begins. A direct exam of the colon should be repeated every 5-10 years through age 33, unless early forms of precancerous polyps or small growths are found.  People who are at an increased risk for hepatitis B should be screened for this virus. You are considered at high risk for hepatitis B if:  You were born in a country where hepatitis B occurs often. Talk with your health care provider about which countries are considered high risk.  Your parents were born in a high-risk country and you have not received a shot to protect against hepatitis B (hepatitis B vaccine).  You have HIV or AIDS.  You use needles to inject street  drugs.  You live with, or have sex with, someone who has hepatitis B.  You are a man who has sex with other men (MSM).  You get hemodialysis treatment.  You take certain medicines for conditions like cancer, organ transplantation, and autoimmune conditions.  Hepatitis C blood testing is recommended for all people born from [birth year] through 1963 and any individual with known risk factors for hepatitis C.  Healthy men should no longer receive prostate-specific antigen (PSA) blood tests as part of routine cancer screening. Talk to your health care provider about prostate cancer screening.  Testicular cancer screening is not recommended for adolescents or adult males who have no symptoms. Screening includes self-exam, a health care provider exam, and other screening tests. Consult with your health care provider about any symptoms you have or any concerns you have  about testicular cancer.  Practice safe sex. Use condoms and avoid high-risk sexual practices to reduce the spread of sexually transmitted infections (STIs).  You should be screened for STIs, including gonorrhea and chlamydia if:  You are sexually active and are younger than 24 years.  You are older than 24 years, and your health care provider tells you that you are at risk for this type of infection.  Your sexual activity has changed since you were last screened, and you are at an increased risk for chlamydia or gonorrhea. Ask your health care provider if you are at risk.  If you are at risk of being infected with HIV, it is recommended that you take a prescription medicine daily to prevent HIV infection. This is called pre-exposure prophylaxis (PrEP). You are considered at risk if:  You are a man who has sex with other men (MSM).  You are a heterosexual man who is sexually active with multiple partners.  You take drugs by injection.  You are sexually active with a partner who has HIV.  Talk with your health care provider about  whether you are at high risk of being infected with HIV. If you choose to begin PrEP, you should first be tested for HIV. You should then be tested every 3 months for as long as you are taking PrEP.  Use sunscreen. Apply sunscreen liberally and repeatedly throughout the day. You should seek shade when your shadow is shorter than you. Protect yourself by wearing long sleeves, pants, a wide-brimmed hat, and sunglasses year round whenever you are outdoors.  Tell your health care provider of new moles or changes in moles, especially if there is a change in shape or color. Also, tell your health care provider if a mole is larger than the size of a pencil eraser.  A one-time screening for abdominal aortic aneurysm (AAA) and surgical repair of large AAAs by ultrasound is recommended for men aged 36-75 years who are current or former smokers.  Stay current with your vaccines (immunizations).   This information is not intended to replace advice given to you by your health care provider. Make sure you discuss any questions you have with your health care provider.   Document Released: 03/13/2008 Document Revised: 10/06/2014 Document Reviewed: 02/10/2011 Elsevier Interactive Patient Education [employer].
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
## 2016-04-03 ENCOUNTER — Ambulatory Visit (INDEPENDENT_AMBULATORY_CARE_PROVIDER_SITE_OTHER): Payer: Medicare Other | Admitting: Adult Health

## 2016-04-03 ENCOUNTER — Encounter: Payer: Self-pay | Admitting: Adult Health

## 2016-04-03 VITALS — BP 152/84 | HR 74 | Temp 97.9°F | Ht 66.0 in | Wt 145.5 lb

## 2016-04-03 DIAGNOSIS — R630 Anorexia: Secondary | ICD-10-CM | POA: Diagnosis not present

## 2016-04-03 DIAGNOSIS — R5383 Other fatigue: Secondary | ICD-10-CM

## 2016-04-03 DIAGNOSIS — R0602 Shortness of breath: Secondary | ICD-10-CM

## 2016-04-03 DIAGNOSIS — R110 Nausea: Secondary | ICD-10-CM | POA: Diagnosis not present

## 2016-04-03 LAB — BASIC METABOLIC PANEL
BUN: 15 mg/dL (ref 6–23)
CHLORIDE: 96 meq/L (ref 96–112)
CO2: 28 mEq/L (ref 19–32)
Calcium: 9.7 mg/dL (ref 8.4–10.5)
Creatinine, Ser: 1.14 mg/dL (ref 0.40–1.50)
GFR: 64.87 mL/min (ref >60.00)
GLUCOSE: 110 mg/dL — AB (ref 70–99)
POTASSIUM: 3.5 meq/L (ref 3.5–5.1)
Sodium: 132 mEq/L — ABNORMAL LOW (ref 135–145)

## 2016-04-03 LAB — CBC WITH DIFFERENTIAL/PLATELET
BASOS ABS: 0 10*3/uL (ref 0.0–0.1)
Basophils Relative: 0.2 % (ref 0.0–3.0)
EOS ABS: 0.1 10*3/uL (ref 0.0–0.7)
Eosinophils Relative: 1.4 % (ref 0.0–5.0)
HEMATOCRIT: 36.5 % — AB (ref 39.0–52.0)
HEMOGLOBIN: 12.6 g/dL — AB (ref 13.0–17.0)
LYMPHS PCT: 33.7 % (ref 12.0–46.0)
Lymphs Abs: 1.4 10*3/uL (ref 0.7–4.0)
MCHC: 34.4 g/dL (ref 30.0–36.0)
MCV: 100.5 fl — ABNORMAL HIGH (ref 78.0–100.0)
Monocytes Absolute: 0.5 10*3/uL (ref 0.1–1.0)
Monocytes Relative: 11.7 % (ref 3.0–12.0)
Neutro Abs: 2.2 10*3/uL (ref 1.4–7.7)
Neutrophils Relative %: 53 % (ref 43.0–77.0)
Platelets: 154 10*3/uL (ref 150.0–400.0)
RBC: 3.63 Mil/uL — AB (ref 4.22–5.81)
RDW: 14.2 % (ref 11.5–15.5)
WBC: 4.2 10*3/uL (ref 4.0–10.5)

## 2016-04-03 LAB — HEPATIC FUNCTION PANEL
ALBUMIN: 3.9 g/dL (ref 3.5–5.2)
ALK PHOS: 41 U/L (ref 39–117)
ALT: 16 U/L (ref 0–53)
AST: 16 U/L (ref 0–37)
BILIRUBIN TOTAL: 1 mg/dL (ref 0.2–1.2)
Bilirubin, Direct: 0.1 mg/dL (ref 0.0–0.3)
Total Protein: 9 g/dL — ABNORMAL HIGH (ref 6.0–8.3)

## 2016-04-03 LAB — VITAMIN D 25 HYDROXY (VIT D DEFICIENCY, FRACTURES): VITD: 85.99 ng/mL (ref 30.00–100.00)

## 2016-04-03 LAB — VITAMIN B12: Vitamin B-12: 478 pg/mL (ref 211–911)

## 2016-04-03 LAB — MAGNESIUM: MAGNESIUM: 1.7 mg/dL (ref 1.5–2.5)

## 2016-04-03 LAB — TSH: TSH: 1.17 u[IU]/mL (ref 0.35–4.50)

## 2016-04-03 NOTE — Patient Instructions (Addendum)
It was great seeing you again!  I will follow up with you regarding your tests  Go by the [facility] [place] office for the chest x ray   Take 1/2 a pill of the Trazodone at night.     Fatigue Fatigue is feeling tired all of the time, a lack of energy, or a lack of motivation. Occasional or mild fatigue is often a normal response to activity or life in general. However, long-lasting (chronic) or extreme fatigue may indicate an underlying medical condition. HOME CARE INSTRUCTIONS  Watch your fatigue for any changes. The following actions may help to lessen any discomfort you are feeling:  Talk to your health care provider about how much sleep you need each night. Try to get the required amount every night.  Take medicines only as directed by your health care provider.  Eat a healthy and nutritious diet. Ask your health care provider if you need help changing your diet.  Drink enough fluid to keep your urine clear or pale yellow.  Practice ways of relaxing, such as yoga, meditation, massage therapy, or acupuncture.  Exercise regularly.   Change situations that cause you stress. Try to keep your work and personal routine reasonable.  Do not abuse illegal drugs.  Limit alcohol intake to no more than 1 drink per day for nonpregnant women and 2 drinks per day for men. One drink equals 12 ounces of beer, 5 ounces of wine, or 1 ounces of hard liquor.  Take a multivitamin, if directed by your health care provider. SEEK MEDICAL CARE IF:   Your fatigue does not get better.  You have a fever.   You have unintentional weight loss or gain.  You have headaches.   You have difficulty:   Falling asleep.  Sleeping throughout the night.  You feel angry, guilty, anxious, or sad.   You are unable to have a bowel movement (constipation).   You skin is dry.   Your legs or another part of your body is swollen.  SEEK IMMEDIATE MEDICAL CARE IF:   You feel confused.   Your vision is  blurry.  You feel faint or pass out.   You have a severe headache.   You have severe abdominal, pelvic, or back pain.   You have chest pain, shortness of breath, or an irregular or fast heartbeat.   You are unable to urinate or you urinate less than normal.   You develop abnormal bleeding, such as bleeding from the rectum, vagina, nose, lungs, or nipples.  You vomit blood.   You have thoughts about harming yourself or committing suicide.   You are worried that you might harm someone else.    This information is not intended to replace advice given to you by your health care provider. Make sure you discuss any questions you have with your health care provider.   Document Released: 07/13/2007 Document Revised: 10/06/2014 Document Reviewed: 01/17/2014 Elsevier Interactive Patient Education [employer].

## 2016-04-03 NOTE — Progress Notes (Signed)
Subjective:    Patient ID: Peter Wolfe, male    DOB: [birth year], 82 y.o.   MRN: [id number]  HPI  82 year old male patient who  has a past medical history of COPD (chronic obstructive pulmonary disease) ([place]); Insomnia; Panic attack; Hyperlipidemia; GERD (gastroesophageal reflux disease); Hypertension; BPH (benign prostatic hyperplasia); GERD (gastroesophageal reflux disease); and Broken rib.   He presents to the office today for the complaint of fatigue, nausea and poor appetite.   He reports that that his fatigue started about a year ago but has been becoming progressively worse. Lately he does not feel like doing anything besides sleeping.   Over the last week he has been feeling nauseated. He denies any vomiting, fevers, diarrhea, or constipation.   The poor appetite has been an ongoing issue for the last year but he feels as though this issue has been getting worse as well.   He is on some medications that can cause fatigue and tiredness but he has been on these medications for sometime.   Review of Systems  Constitutional: Positive for activity change, appetite change and fatigue.  HENT: Negative.   Eyes: Negative.   Respiratory: Positive for shortness of breath (with laying down ).   Cardiovascular: Negative.   Gastrointestinal: Positive for nausea and constipation (chronic).  Endocrine: Negative.   Genitourinary: Negative.   Musculoskeletal: Negative.   Skin: Negative.   Allergic/Immunologic: Negative.   Neurological: Positive for weakness.  Hematological: Negative.   Psychiatric/Behavioral: Negative.    Past Medical History  Diagnosis Date  . COPD (chronic obstructive pulmonary disease) ([place])   . Insomnia   . Panic attack   . Hyperlipidemia   . GERD (gastroesophageal reflux disease)   . Hypertension   . BPH (benign prostatic hyperplasia)   . GERD (gastroesophageal reflux disease)   . Broken rib     Social History   Social History  . Marital Status:  Single    Spouse Name: N/A  . Number of Children: N/A  . Years of Education: N/A   Occupational History  . Not on file.   Social History Main Topics  . Smoking status: Never Smoker   . Smokeless tobacco: Not on file  . Alcohol Use: 0.0 oz/week    0 Standard drinks or equivalent per week     Comment: Wine with meals   . Drug Use: No  . Sexual Activity: Not on file   Other Topics Concern  . Not on file   Social History Narrative   Retired - Sgt, [employer]    Not married              Past Surgical History  Procedure Laterality Date  . Cholecystectomy      Family History  Problem Relation Age of Onset  . Emphysema Father   . Alcohol abuse Father   . Diabetes Sister     Allergies  Allergen Reactions  . Influenza Virus Vacc Split Pf     REACTION: allergic to eggs  . Doxycycline Rash  . Eggs Or Egg-Derived Products Rash    Current Outpatient Prescriptions on File Prior to Visit  Medication Sig Dispense Refill  . busPIRone (BUSPAR) 30 MG tablet Take 1 tablet (30 mg total) by mouth daily. 100 tablet 3  . cloNIDine (CATAPRES) 0.1 MG tablet Take 1 tablet (0.1 mg total) by mouth daily. 100 tablet 3  . FLUoxetine (PROZAC) 20 MG capsule Take 1 capsule (20 mg total) by mouth daily. 100 capsule  3  . Multiple Vitamin (MULTIVITAMIN) capsule Take 1 capsule by mouth daily.    Marland Kitchen omeprazole (PRILOSEC) 20 MG capsule Take 1 capsule (20 mg total) by mouth daily. 100 capsule 3  . simvastatin (ZOCOR) 10 MG tablet Take 1 tablet (10 mg total) by mouth at bedtime. 100 tablet 3  . tamsulosin (FLOMAX) 0.4 MG CAPS capsule Take 1 capsule (0.4 mg total) by mouth daily. 100 capsule 3  . traZODone (DESYREL) 100 MG tablet Take 1 tablet (100 mg total) by mouth at bedtime. 100 tablet 3  . triamterene-hydrochlorothiazide (MAXZIDE) 75-50 MG tablet Take 1 tablet by mouth daily. 100 tablet 3   No current facility-administered medications on file prior to visit.    BP 152/84 mmHg  Pulse 74  Temp(Src)  97.9 F (36.6 C) (Oral)  Ht 5\' 6"  (1.676 m)  Wt 145 lb 8 oz (65.998 kg)  BMI 23.50 kg/m2  SpO2 96%       Objective:   Physical Exam  Constitutional: He is oriented to person, place, and time. He appears well-developed and well-nourished. No distress.  HENT:  Head: Normocephalic and atraumatic.  Right Ear: External ear normal.  Left Ear: External ear normal.  Nose: Nose normal.  Mouth/Throat: Oropharynx is clear and moist. No oropharyngeal exudate.  Eyes: Conjunctivae and EOM are normal. Pupils are equal, round, and reactive to light. Right eye exhibits no discharge. Left eye exhibits no discharge. No scleral icterus.  Neck: Normal range of motion. Neck supple. No thyromegaly present.  Cardiovascular: Normal rate, regular rhythm, normal heart sounds and intact distal pulses.  Exam reveals no gallop and no friction rub.   No murmur heard. Pulmonary/Chest: Effort normal and breath sounds normal. No respiratory distress. He has no wheezes. He has no rales. He exhibits no tenderness.  Abdominal: Soft. Bowel sounds are normal. He exhibits no distension and no mass. There is no tenderness. There is no rebound and no guarding.  Musculoskeletal: Normal range of motion. He exhibits no edema or tenderness.  Lymphadenopathy:    He has no cervical adenopathy.  Neurological: He is alert and oriented to person, place, and time.  Skin: Skin is warm and dry. No rash noted. He is not diaphoretic. No erythema. No pallor.  Psychiatric: He has a normal mood and affect. His behavior is normal. Judgment normal.  Nursing note and vitals reviewed.      Assessment & Plan:  1. Other fatigue - Chronic issue that has been apparent for one year or greater. Differential list is long but includes: Depression/Anxiety vs medication use vs hypothyroidism vs renal or hepatic failure. Possibly malignancy  - EKG 12-Lead- Sinus  Rhythm  -Poor R-wave progression -nonspecific -consider old anterior infarct.  - TSH -  CBC with Differential/Platelet - Basic metabolic panel - Vitamin D, 25-hydroxy - Vitamin B12 - Hepatic function panel - DG Chest 2 View; Future - Magnesium  2. Nausea without vomiting - Seems to have resoled - TSH - CBC with Differential/Platelet - Basic metabolic panel - Vitamin D, 25-hydroxy - Vitamin B12 - Hepatic function panel - DG Chest 2 View; Future - Magnesium  3. Appetite loss - Tied to fatigue  - TSH - CBC with Differential/Platelet - Basic metabolic panel - Vitamin D, 25-hydroxy - Vitamin B12 - Hepatic function panel - DG Chest 2 View; Future - Magnesium - Consider adding

## 2016-04-04 ENCOUNTER — Ambulatory Visit (INDEPENDENT_AMBULATORY_CARE_PROVIDER_SITE_OTHER)
Admission: RE | Admit: 2016-04-04 | Discharge: 2016-04-04 | Disposition: A | Discharge status: Home / Self Care | Payer: Medicare Other | Source: Ambulatory Visit | Attending: Adult Health | Admitting: Adult Health

## 2016-04-04 DIAGNOSIS — R110 Nausea: Secondary | ICD-10-CM | POA: Diagnosis not present

## 2016-04-04 DIAGNOSIS — R5383 Other fatigue: Secondary | ICD-10-CM

## 2016-04-04 DIAGNOSIS — R630 Anorexia: Secondary | ICD-10-CM

## 2016-04-08 ENCOUNTER — Telehealth: Payer: Self-pay | Admitting: Adult Health

## 2016-04-08 NOTE — Telephone Encounter (Signed)
Spoke to [facility] about his labs and chest x ray. He is a little anemic so he is going to try an OtC iron supplement to see if this helps correct his symptoms, especially fatigue.   Follow up as needed

## 2016-05-07 ENCOUNTER — Encounter: Payer: Self-pay | Admitting: Adult Health

## 2016-05-20 ENCOUNTER — Ambulatory Visit (INDEPENDENT_AMBULATORY_CARE_PROVIDER_SITE_OTHER): Payer: Medicare Other | Admitting: Adult Health

## 2016-05-20 ENCOUNTER — Encounter: Payer: Self-pay | Admitting: Adult Health

## 2016-05-20 VITALS — BP 132/74 | Temp 98.1°F | Ht 66.0 in | Wt 144.4 lb

## 2016-05-20 DIAGNOSIS — G479 Sleep disorder, unspecified: Secondary | ICD-10-CM | POA: Diagnosis not present

## 2016-05-20 DIAGNOSIS — F329 Major depressive disorder, single episode, unspecified: Secondary | ICD-10-CM | POA: Diagnosis not present

## 2016-05-20 DIAGNOSIS — R630 Anorexia: Secondary | ICD-10-CM

## 2016-05-20 DIAGNOSIS — F32A Depression, unspecified: Secondary | ICD-10-CM

## 2016-05-20 MED ORDER — MIRTAZAPINE 15 MG PO TABS: 15.0000 mg | ORAL_TABLET | Freq: Every day | ORAL | 3 refills | Status: DC

## 2016-05-20 NOTE — Progress Notes (Addendum)
Subjective:    Patient ID: Peter Wolfe, male    DOB: 28-Jan-1929, 82 y.o.   MRN: [id number]  HPI  82 year old male who presents to the office today with continued fatigue, loss of appetite and sleep disturbance. He reports that he is taking OTC iron and vitamin B12 and does not seem to be feeling any better. This has been an issue for over a year at this point in time. Is been worked up in the past for this labs and chest x-ray were unremarkable.   He reports that he is suffering from a lot of depression. This is been an ongoing issue with Peter Wolfe, he is currently taking 20 mg of Prozac. He was as though his depression has been worse over the last few months. He cannot put his finger on one specific instance is causing his depression to become worse. He denies any suicidal ideation.  Also complains of an ongoing issue with sleep disturbance area he reports that some nights he is able to sleep very well will sleep 10 hours a day and other nights he is unable to fall asleep at all  He has been working on his diet Peter Wolfe is eating a lot of fruits and vegetables. He rarely eats red meats. Not eat much food but he feels as though his appetite has not been well recently.  Wt Readings from Last 3 Encounters:  05/20/16 144 lb 6.4 oz (65.5 kg)  04/03/16 145 lb 8 oz (66 kg)  02/12/16 149 lb (67.6 kg)    Denies any fevers, nausea, vomiting, or diarrhea and he does not feel acutely sick   Review of Systems  Constitutional: Positive for activity change, appetite change and fatigue. Negative for diaphoresis and fever.  HENT: Negative.   Respiratory: Negative.   Cardiovascular: Negative.   Gastrointestinal: Negative.   Endocrine: Negative.   Genitourinary: Negative.   Musculoskeletal: Positive for arthralgias and myalgias. Negative for back pain, gait problem, joint swelling, neck pain and neck stiffness.  Neurological: Negative.   Psychiatric/Behavioral: Positive for decreased concentration  and sleep disturbance. Negative for agitation, behavioral problems, confusion and suicidal ideas. The patient is not nervous/anxious.        Depression   All other systems reviewed and are negative.  Past Medical History:  Diagnosis Date  . BPH (benign prostatic hyperplasia)   . Broken rib   . COPD (chronic obstructive pulmonary disease) ([place])   . GERD (gastroesophageal reflux disease)   . GERD (gastroesophageal reflux disease)   . Hyperlipidemia   . Hypertension   . Insomnia   . Panic attack     Social History   Social History  . Marital status: Single    Spouse name: N/A  . Number of children: N/A  . Years of education: N/A   Occupational History  . Not on file.   Social History Main Topics  . Smoking status: Never Smoker  . Smokeless tobacco: Not on file  . Alcohol use 0.0 oz/week     Comment: Wine with meals   . Drug use: No  . Sexual activity: Not on file   Other Topics Concern  . Not on file   Social History Narrative   Retired - Sgt, [employer]    Not married              Past Surgical History:  Procedure Laterality Date  . CHOLECYSTECTOMY      Family History  Problem Relation Age of Onset  .  Emphysema Father   . Alcohol abuse Father   . Diabetes Sister     Allergies  Allergen Reactions  . Influenza Virus Vacc Split Pf     REACTION: allergic to eggs  . Doxycycline Rash  . Eggs Or Egg-Derived Products Rash    Current Outpatient Prescriptions on File Prior to Visit  Medication Sig Dispense Refill  . busPIRone (BUSPAR) 30 MG tablet Take 1 tablet (30 mg total) by mouth daily. 100 tablet 3  . cloNIDine (CATAPRES) 0.1 MG tablet Take 1 tablet (0.1 mg total) by mouth daily. 100 tablet 3  . FLUoxetine (PROZAC) 20 MG capsule Take 1 capsule (20 mg total) by mouth daily. 100 capsule 3  . Multiple Vitamin (MULTIVITAMIN) capsule Take 1 capsule by mouth daily.    Marland Kitchen omeprazole (PRILOSEC) 20 MG capsule Take 1 capsule (20 mg total) by mouth daily. 100 capsule  3  . simvastatin (ZOCOR) 10 MG tablet Take 1 tablet (10 mg total) by mouth at bedtime. 100 tablet 3  . tamsulosin (FLOMAX) 0.4 MG CAPS capsule Take 1 capsule (0.4 mg total) by mouth daily. 100 capsule 3  . triamterene-hydrochlorothiazide (MAXZIDE) 75-50 MG tablet Take 1 tablet by mouth daily. 100 tablet 3   No current facility-administered medications on file prior to visit.     BP 132/74   Temp 98.1 F (36.7 C) (Oral)   Ht 5\' 6"  (1.676 m)   Wt 144 lb 6.4 oz (65.5 kg)   BMI 23.31 kg/m       Objective:   Physical Exam  Constitutional: He is oriented to person, place, and time. He appears well-developed and well-nourished. No distress.  Eyes: Conjunctivae and EOM are normal. Pupils are equal, round, and reactive to light. Right eye exhibits no discharge. Left eye exhibits no discharge.  Cardiovascular: Normal rate, regular rhythm, normal heart sounds and intact distal pulses.  Exam reveals no gallop and no friction rub.   No murmur heard. Pulmonary/Chest: Effort normal and breath sounds normal. No respiratory distress. He has no wheezes. He has no rales. He exhibits no tenderness.  Abdominal: Soft. Bowel sounds are normal. He exhibits no distension and no mass. There is no tenderness. There is no rebound and no guarding.  Musculoskeletal: Normal range of motion. He exhibits no edema, tenderness or deformity.  Neurological: He is alert and oriented to person, place, and time. He displays normal reflexes. No cranial nerve deficit. He exhibits normal muscle tone. Coordination normal.  Skin: Skin is warm and dry. No rash noted. He is not diaphoretic. No erythema. No pallor.  Psychiatric: His behavior is normal. Judgment and thought content normal.  Flat affect  Nursing note and vitals reviewed.      Assessment & Plan:  1. Depression - I feel as though his depression is a contributing factor in his symptoms. I'll trial Remeron 15 mg tablets at night - mirtazapine (REMERON) 15 MG  tablet; Take 1 tablet (15 mg total) by mouth at bedtime.  Dispense: 30 tablet; Refill: 3 - DC nightly trazodone -Wolfe up in 2 weeks 2. Sleep disturbance  - mirtazapine (REMERON) 15 MG tablet; Take 1 tablet (15 mg total) by mouth at bedtime.  Dispense: 30 tablet; Refill: 3  3. Appetite loss - Increase high protein foods  - mirtazapine (REMERON) 15 MG tablet; Take 1 tablet (15 mg total) by mouth at bedtime.  Dispense: 30 tablet; Refill: 3 - We'll consider CAT scan of abdomen with or without contrast if symptoms continue  [facility]  Arlisha Patalano, NP

## 2016-05-20 NOTE — Patient Instructions (Signed)
Please stop Trazodone at night.   I have prescribed Remeron 15 mg, take this every night instead  Follow up with me in 2 weeks to see how you are doing  If needed, we will get a CT of your abdomen.

## 2016-06-03 ENCOUNTER — Ambulatory Visit (INDEPENDENT_AMBULATORY_CARE_PROVIDER_SITE_OTHER): Payer: Medicare Other | Admitting: Adult Health

## 2016-06-03 ENCOUNTER — Encounter: Payer: Self-pay | Admitting: Adult Health

## 2016-06-03 DIAGNOSIS — F32A Depression, unspecified: Secondary | ICD-10-CM

## 2016-06-03 DIAGNOSIS — F329 Major depressive disorder, single episode, unspecified: Secondary | ICD-10-CM | POA: Diagnosis not present

## 2016-06-03 NOTE — Patient Instructions (Signed)
It was great seeing you today!  I am so happy that you are starting to feel better. Continue with the current dose of remeron. We will revisit this issue in November but if you need anything before that, please let me know

## 2016-06-03 NOTE — Progress Notes (Signed)
Subjective:    Patient ID: Peter Wolfe, male    DOB: Sep 10, 1929, 82 y.o.   MRN: [id number]  HPI  82 year old male who presents to the office today for two week follow up after starting remeron. He reports that since starting Remeron he is sleeping better, his appetite has improved and he feels as though his depression is improving. His only complaint is that of feeling " groggy" when he first wakes up in the morning.   He denies any thoughts of suicide or self harm.   Review of Systems  Constitutional: Negative.   Respiratory: Negative.   Cardiovascular: Negative.   Skin: Negative.   Psychiatric/Behavioral: Positive for sleep disturbance.       Depression   All other systems reviewed and are negative.  Past Medical History:  Diagnosis Date  . BPH (benign prostatic hyperplasia)   . Broken rib   . COPD (chronic obstructive pulmonary disease) ([place])   . GERD (gastroesophageal reflux disease)   . GERD (gastroesophageal reflux disease)   . Hyperlipidemia   . Hypertension   . Insomnia   . Panic attack     Social History   Social History  . Marital status: Single    Spouse name: N/A  . Number of children: N/A  . Years of education: N/A   Occupational History  . Not on file.   Social History Main Topics  . Smoking status: Never Smoker  . Smokeless tobacco: Not on file  . Alcohol use 0.0 oz/week     Comment: Wine with meals   . Drug use: No  . Sexual activity: Not on file   Other Topics Concern  . Not on file   Social History Narrative   Retired - Sgt, [employer]    Not married              Past Surgical History:  Procedure Laterality Date  . CHOLECYSTECTOMY      Family History  Problem Relation Age of Onset  . Emphysema Father   . Alcohol abuse Father   . Diabetes Sister     Allergies  Allergen Reactions  . Influenza Virus Vacc Split Pf     REACTION: allergic to eggs  . Doxycycline Rash  . Eggs Or Egg-Derived Products Rash    Current  Outpatient Prescriptions on File Prior to Visit  Medication Sig Dispense Refill  . busPIRone (BUSPAR) 30 MG tablet Take 1 tablet (30 mg total) by mouth daily. 100 tablet 3  . cloNIDine (CATAPRES) 0.1 MG tablet Take 1 tablet (0.1 mg total) by mouth daily. 100 tablet 3  . FLUoxetine (PROZAC) 20 MG capsule Take 1 capsule (20 mg total) by mouth daily. 100 capsule 3  . mirtazapine (REMERON) 15 MG tablet Take 1 tablet (15 mg total) by mouth at bedtime. 30 tablet 3  . Multiple Vitamin (MULTIVITAMIN) capsule Take 1 capsule by mouth daily.    Marland Kitchen omeprazole (PRILOSEC) 20 MG capsule Take 1 capsule (20 mg total) by mouth daily. 100 capsule 3  . simvastatin (ZOCOR) 10 MG tablet Take 1 tablet (10 mg total) by mouth at bedtime. 100 tablet 3  . tamsulosin (FLOMAX) 0.4 MG CAPS capsule Take 1 capsule (0.4 mg total) by mouth daily. 100 capsule 3  . triamterene-hydrochlorothiazide (MAXZIDE) 75-50 MG tablet Take 1 tablet by mouth daily. 100 tablet 3   No current facility-administered medications on file prior to visit.     BP (!) 142/80   Temp 97.9 F (  36.6 C) (Oral)   Ht 5\' 6"  (1.676 m)   Wt 141 lb 12.8 oz (64.3 kg)   BMI 22.89 kg/m       Objective:   Physical Exam  Constitutional: He is oriented to person, place, and time. He appears well-developed and well-nourished. No distress.  HENT:  Head: Normocephalic and atraumatic.  Right Ear: External ear normal.  Left Ear: External ear normal.  Nose: Nose normal.  Mouth/Throat: Oropharynx is clear and moist. No oropharyngeal exudate.  Eyes: Conjunctivae and EOM are normal. Pupils are equal, round, and reactive to light. Right eye exhibits no discharge. Left eye exhibits no discharge. No scleral icterus.  Cardiovascular: Normal rate, regular rhythm, normal heart sounds and intact distal pulses.  Exam reveals no gallop and no friction rub.   No murmur heard. Pulmonary/Chest: Effort normal and breath sounds normal. No respiratory distress. He has no wheezes.  He has no rales. He exhibits no tenderness.  Musculoskeletal: Normal range of motion.  Neurological: He is alert and oriented to person, place, and time.  Skin: Skin is warm and dry. No rash noted. He is not diaphoretic. No erythema. No pallor.  Psychiatric: He has a normal mood and affect. His behavior is normal. Judgment and thought content normal.  Nursing note and vitals reviewed.     Assessment & Plan:  1. Depression - Continue with current dose of Remeron. I advised him that he could cut pill in half if he felt like he needed to.  - Will follow up with him in November during his physical about this.  - Follow up as needed  [name], NP

## 2016-07-28 ENCOUNTER — Encounter: Payer: Self-pay | Admitting: Family Medicine

## 2016-07-28 ENCOUNTER — Ambulatory Visit (INDEPENDENT_AMBULATORY_CARE_PROVIDER_SITE_OTHER): Payer: Medicare Other | Admitting: Family Medicine

## 2016-07-28 VITALS — BP 122/72 | HR 83 | Temp 97.5°F | Ht 66.0 in | Wt 143.5 lb

## 2016-07-28 DIAGNOSIS — J988 Other specified respiratory disorders: Secondary | ICD-10-CM | POA: Diagnosis not present

## 2016-07-28 MED ORDER — PREDNISONE 20 MG PO TABS: 40.0000 mg | ORAL_TABLET | Freq: Every day | ORAL | 0 refills | Status: DC

## 2016-07-28 MED ORDER — ALBUTEROL SULFATE HFA 108 (90 BASE) MCG/ACT IN AERS: 2.0000 | INHALATION_SPRAY | Freq: Four times a day (QID) | RESPIRATORY_TRACT | 0 refills | Status: DC | PRN

## 2016-07-28 MED ORDER — LEVOFLOXACIN 500 MG PO TABS: 500.0000 mg | ORAL_TABLET | Freq: Every day | ORAL | 0 refills | Status: DC

## 2016-07-28 NOTE — Progress Notes (Signed)
HPI:  Acute visit for:  Cough: -started: 2 weeks ago with cough, congestion, fatigue - reports cough has gotten worse with thick sputum, occ wheezing -denies:fever, SOB, NVD, tooth pain -has tried: musinex which helps cough -sick contacts/travel/risks: no reported flu, strep or tick exposure -Hx of: reported hx COPD, reports on inhalers remotely - denies smoking hx  ROS: See pertinent positives and negatives per HPI.  Past Medical History:  Diagnosis Date  . BPH (benign prostatic hyperplasia)   . Broken rib   . COPD (chronic obstructive pulmonary disease) ([place])   . GERD (gastroesophageal reflux disease)   . GERD (gastroesophageal reflux disease)   . Hyperlipidemia   . Hypertension   . Insomnia   . Panic attack     Past Surgical History:  Procedure Laterality Date  . CHOLECYSTECTOMY      Family History  Problem Relation Age of Onset  . Emphysema Father   . Alcohol abuse Father   . Diabetes Sister     Social History   Social History  . Marital status: Single    Spouse name: N/A  . Number of children: N/A  . Years of education: N/A   Social History Main Topics  . Smoking status: Never Smoker  . Smokeless tobacco: None  . Alcohol use 0.0 oz/week     Comment: Wine with meals   . Drug use: No  . Sexual activity: Not Asked   Other Topics Concern  . None   Social History Narrative   Retired - Sgt, Major    Not married               Current Outpatient Prescriptions:  .  busPIRone (BUSPAR) 30 MG tablet, Take 1 tablet (30 mg total) by mouth daily., Disp: 100 tablet, Rfl: 3 .  cloNIDine (CATAPRES) 0.1 MG tablet, Take 1 tablet (0.1 mg total) by mouth daily., Disp: 100 tablet, Rfl: 3 .  FLUoxetine (PROZAC) 20 MG capsule, Take 1 capsule (20 mg total) by mouth daily., Disp: 100 capsule, Rfl: 3 .  mirtazapine (REMERON) 15 MG tablet, Take 1 tablet (15 mg total) by mouth at bedtime., Disp: 30 tablet, Rfl: 3 .  Multiple Vitamin (MULTIVITAMIN) capsule, Take 1  capsule by mouth daily., Disp: , Rfl:  .  omeprazole (PRILOSEC) 20 MG capsule, Take 1 capsule (20 mg total) by mouth daily., Disp: 100 capsule, Rfl: 3 .  simvastatin (ZOCOR) 10 MG tablet, Take 1 tablet (10 mg total) by mouth at bedtime., Disp: 100 tablet, Rfl: 3 .  tamsulosin (FLOMAX) 0.4 MG CAPS capsule, Take 1 capsule (0.4 mg total) by mouth daily., Disp: 100 capsule, Rfl: 3 .  triamterene-hydrochlorothiazide (MAXZIDE) 75-50 MG tablet, Take 1 tablet by mouth daily., Disp: 100 tablet, Rfl: 3 .  albuterol (PROVENTIL HFA;VENTOLIN HFA) 108 (90 Base) MCG/ACT inhaler, Inhale 2 puffs into the lungs every 6 (six) hours as needed., Disp: 1 Inhaler, Rfl: 0 .  levofloxacin (LEVAQUIN) 500 MG tablet, Take 1 tablet (500 mg total) by mouth daily., Disp: 7 tablet, Rfl: 0 .  predniSONE (DELTASONE) 20 MG tablet, Take 2 tablets (40 mg total) by mouth daily with breakfast., Disp: 8 tablet, Rfl: 0  EXAM:  Vitals:   07/28/16 1013  BP: 122/72  Pulse: 83  Temp: 97.5 F (36.4 C)    Body mass index is 23.16 kg/m.  GENERAL: vitals reviewed and listed above, alert, oriented, appears well hydrated and in no acute distress  HEENT: atraumatic, conjunttiva clear, no obvious abnormalities on inspection  of external nose and ears, normal appearance of ear canals and TMs,  mild post oropharyngeal erythema with PND, no tonsillar edema or exudate, no sinus TTP  NECK: no obvious masses on inspection  LUNGS: no signs resp distress, scattered wheeze  CV: HRRR, no peripheral edema  MS: moves all extremities without noticeable abnormality  PSYCH: pleasant and cooperative, no obvious depression or anxiety  ASSESSMENT AND PLAN:  Discussed the following assessment and plan:  Respiratory infection  We discussed potential etiologies - COPD with exacerbation including increased discolored mucus production. Opted after discussion risks various options to treat with prednisone, Levaquin and prn alb. We discussed treatment  side effects, likely course, and signs of developing a serious illness. -of course, we advised to return or notify a doctor immediately if symptoms worsen or persist or new concerns arise.    Patient Instructions  I sent the antibiotic, prednisone and the inhaler to the pharmacy.  I hope you are feeling better soon.  Please seek care immediately if worsening, new concerns or symptoms persist despite treatment.   Colin Benton R., DO

## 2016-07-28 NOTE — Progress Notes (Signed)
Pre visit review using our clinic review tool, if applicable. No additional management support is needed unless otherwise documented below in the visit note. 

## 2016-07-28 NOTE — Patient Instructions (Signed)
I sent the antibiotic, prednisone and the inhaler to the pharmacy.  I hope you are feeling better soon.  Please seek care immediately if worsening, new concerns or symptoms persist despite treatment.

## 2016-07-29 ENCOUNTER — Other Ambulatory Visit: Payer: Self-pay | Admitting: Family Medicine

## 2016-07-29 DIAGNOSIS — G4700 Insomnia, unspecified: Secondary | ICD-10-CM

## 2016-08-05 ENCOUNTER — Emergency Department (HOSPITAL_COMMUNITY): Payer: Medicare Other

## 2016-08-05 ENCOUNTER — Emergency Department (HOSPITAL_COMMUNITY)
Admission: EM | Admit: 2016-08-05 | Discharge: 2016-08-05 | Disposition: A | Discharge status: Home / Self Care | Payer: Medicare Other | Attending: Emergency Medicine | Admitting: Emergency Medicine

## 2016-08-05 ENCOUNTER — Encounter (HOSPITAL_COMMUNITY): Payer: Self-pay | Admitting: *Deleted

## 2016-08-05 DIAGNOSIS — I10 Essential (primary) hypertension: Secondary | ICD-10-CM | POA: Diagnosis not present

## 2016-08-05 DIAGNOSIS — M79672 Pain in left foot: Secondary | ICD-10-CM

## 2016-08-05 DIAGNOSIS — Z79899 Other long term (current) drug therapy: Secondary | ICD-10-CM | POA: Insufficient documentation

## 2016-08-05 DIAGNOSIS — M62838 Other muscle spasm: Secondary | ICD-10-CM | POA: Diagnosis not present

## 2016-08-05 DIAGNOSIS — R05 Cough: Secondary | ICD-10-CM | POA: Diagnosis not present

## 2016-08-05 DIAGNOSIS — M25571 Pain in right ankle and joints of right foot: Secondary | ICD-10-CM | POA: Insufficient documentation

## 2016-08-05 DIAGNOSIS — R1032 Left lower quadrant pain: Secondary | ICD-10-CM | POA: Diagnosis not present

## 2016-08-05 DIAGNOSIS — M6289 Other specified disorders of muscle: Secondary | ICD-10-CM

## 2016-08-05 DIAGNOSIS — M25572 Pain in left ankle and joints of left foot: Secondary | ICD-10-CM | POA: Insufficient documentation

## 2016-08-05 DIAGNOSIS — M79671 Pain in right foot: Secondary | ICD-10-CM | POA: Diagnosis not present

## 2016-08-05 DIAGNOSIS — R0602 Shortness of breath: Secondary | ICD-10-CM | POA: Diagnosis not present

## 2016-08-05 DIAGNOSIS — J449 Chronic obstructive pulmonary disease, unspecified: Secondary | ICD-10-CM | POA: Insufficient documentation

## 2016-08-05 MED ORDER — NAPROXEN 500 MG PO TABS: 500.0000 mg | ORAL_TABLET | Freq: Two times a day (BID) | ORAL | 0 refills | Status: DC

## 2016-08-05 MED ORDER — KETOROLAC TROMETHAMINE 30 MG/ML IJ SOLN
15.0000 mg | Freq: Once | INTRAMUSCULAR | Status: AC
  Administered 2016-08-05: 15 mg via INTRAMUSCULAR
  Filled 2016-08-05: qty 1

## 2016-08-05 NOTE — Progress Notes (Signed)
CSW informed by unit secretary to call [phone] and speak with PA-C regarding patient.  Spoke with PA-C who states after speaking with EDP, they recommend rehab placement for patient. He stated patient is experiencing lower weakness and tightness in his lower legs. He stated he does not think patient is strong enough to go home with home health. CSW asked PA-C to place order for PT eval.   Spoke with patient at bedside along with family friend present, Jonelle Sidle. Patient reports he has never been to a SNF/Rehab. Patient reports he has severe pain in the arches of his feet and he cannot stand, as he states he is shaky. Patient also reports pain in his upper thigh. Patient reports no family in the area and he lives alone.    Merry Proud, LCSWA Clinical Social Worker [phone]

## 2016-08-05 NOTE — Progress Notes (Signed)
ED CM contacted by ED PA Berline Lopes about pt needing rolling walker and mentioned CM had been working with pt on home health services ED Cm had EDP clarify home health orders Discussed pt is medicare/tricare covered and if not having a qualified inpatient medicare stay for facility placement  Spoke with pt and male friend at bedside about CM reviewed in details medicare guidelines, Choices of home health [facility]) (length of stay in home, types of [facility] staff available, coverage, primary caregiver, up to 24 hrs before services may be started) and choices of Private duty nursing (PDN-coverage, length of stay in the home types of staff available). CM reviewed availability of [facility] SW to assist pcp to get pt to snf (if desired disposition) from the community level. CM provided pt/family with a list of [place] home health agencies and PDN.   Discussed pt to be further evaluated by unit therapists (PT/OT) for recommendation of level of care and share this with attending MD, SW and CM Cm asked male friend if she would be a caregiver and she inquired about commitment to care for pt, if she would have to stay with him and timeframe of caregiver services CM explained to her that she did not have to commit to at timeframe for care for pt and could be available when home health visited to be teachable to the skills the home health staff would provide Discussed that pt could have more than one caregiver in the home if she was not available all the time Male friend states pt has church members to assist him also  Pt informed Cm he initially had issues with his ankle but since given a shot the pain is now "in his thighs" and he is awaiting PT to see him in ED to evaluate him for walking and the ED SW, "Erin" about "[facility]" Pt states his mother used [facility] and if he would be getting a walker from [facility], [facility] would [facility] his [facility] of [facility] health [facility]   [phone] CM spoke with ED SW, [place] to attempt to  clarify if pt was being offered private pay snf Pending pt being seen by ED PM SW Discussed pt medicare, tricare pt  [place] from [facility] brought pt [facility] rolling [facility]er to [facility] ED - [facility] PT arrived to work with pt  53 Cm spoke with Manuela Schwartz of [facility] to discuss referral for HHPT, RN and SW Pending pt speaking to ED SW about possible private pay to SNF  1535 updated pt that CM had spoken with ED SW and noted his RW in his room with him and PT  1600 Call from [facility] who continues to monitor pt for d/c needs  1620 CM review with [facility] ED SW pt wanting to speak with SW about d/c options - snf placement

## 2016-08-05 NOTE — Discharge Instructions (Signed)
You likely have hamstring tightness resulting from your recent inactivity as you recovered from pneumonia. Please resume wearing your regular tennis shoes, and try gentle stretching, heat, and massage for relief. Take the prescription Naprosyn as directed for pain. Also, please walk with your walker to minimize any instability. The home PT we prescribed should help with your tightness and foot pain. Please follow up with your PCP if pain does not improve.

## 2016-08-05 NOTE — ED Provider Notes (Signed)
[facility] DEPT Provider Note   CSN: [id number] Arrival date & time: 08/05/16  0932     History   Chief Complaint Chief Complaint  Patient presents with  . Ankle Pain    HPI Peter Wolfe is a 82 y.o. male.  Patient is 82 yo M with PMH of hypertension, presenting with bilateral pain in the "arches" of his feet over the past 3 days. Also reports bilateral "tightness" in his posterior thighs, and pain in his lower abdomen made worse with cough and twisting at the waist. Recently treated for pneumonia, completing full course of Levaquin, and states he has been sitting on the cough more instead of doing his normal walking routine. Denies fevers, chills, worsening cough, or shortness of breath. He denies any focal pain or tenderness in his lower extremities, but progressive "tightness" in his legs, and difficulty walking due to pain. States he has been wearing his dress shoes recently instead of his normal tennis shoes, and has experienced hamstring tightness in the past. 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      Past Medical History:  Diagnosis Date  . BPH (benign prostatic hyperplasia)   . Broken rib   . COPD (chronic obstructive pulmonary disease) ([place])   . GERD (gastroesophageal reflux disease)   . GERD (gastroesophageal reflux disease)   . Hyperlipidemia   . Hypertension   . Insomnia   . Panic attack     Patient Active Problem List   Diagnosis Date Noted  . Depression 06/03/2016  . History of fractured rib 11/28/2015  . History of peripheral edema 11/28/2015  . Seborrheic keratosis, inflamed 08/13/2015  . Actinic keratosis 08/13/2015  . Fatigue 07/31/2015  . Essential hypertension, benign 07/31/2015  . URINARY FREQUENCY 08/16/2009  . URTICARIA 11/13/2007  . Hyperlipidemia 06/08/2007  . GERD 06/08/2007  . PANIC ATTACK 05/07/2007  . COPD 05/07/2007  . INSOMNIA 05/07/2007    Past Surgical  History:  Procedure Laterality Date  . CHOLECYSTECTOMY         Home Medications    Prior to Admission medications   Medication Sig Start Date End Date Taking? Authorizing Provider  albuterol (PROVENTIL HFA;VENTOLIN HFA) 108 (90 Base) MCG/ACT inhaler Inhale 2 puffs into the lungs every 6 (six) hours as needed. 07/28/16   Lucretia Kern, DO  busPIRone (BUSPAR) 30 MG tablet TAKE 1 TABLET DAILY 07/29/16   Dorena Cookey, MD  cloNIDine (CATAPRES) 0.1 MG tablet TAKE 1 TABLET DAILY 07/29/16   Dorena Cookey, MD  FLUoxetine (PROZAC) 20 MG capsule TAKE 1 CAPSULE DAILY 07/29/16   Dorena Cookey, MD  levofloxacin (LEVAQUIN) 500 MG tablet Take 1 tablet (500 mg total) by mouth daily. 07/28/16   Lucretia Kern, DO  mirtazapine (REMERON) 15 MG tablet Take 1 tablet (15 mg total) by mouth at bedtime. 05/20/16   [name], NP  Multiple Vitamin (MULTIVITAMIN) capsule Take 1 capsule by mouth daily.    Historical Provider, MD  omeprazole (PRILOSEC) 20 MG capsule TAKE 1 CAPSULE DAILY 07/29/16   Dorena Cookey, MD  predniSONE (DELTASONE) 20 MG tablet Take 2 tablets (40 mg total) by mouth daily with breakfast. 07/28/16   Lucretia Kern, DO  simvastatin (ZOCOR) 10 MG tablet TAKE 1 TABLET AT BEDTIME 07/29/16   Dorena Cookey, MD  tamsulosin (FLOMAX) 0.4 MG CAPS capsule TAKE 1 CAPSULE DAILY 07/29/16   Dorena Cookey, MD  traZODone (DESYREL) 100 MG tablet TAKE 1 TABLET AT BEDTIME 07/29/16   Dorena Cookey, MD  triamterene-hydrochlorothiazide (MAXZIDE) 75-50 MG tablet Take 1 tablet by mouth daily. 07/31/15   Dorena Cookey, MD    Family History Family History  Problem Relation Age of Onset  . Emphysema Father   . Alcohol abuse Father   . Diabetes Sister     Social History Social History  Substance Use Topics  . Smoking status: Never Smoker  . Smokeless tobacco: Never Used  . Alcohol use 0.0 oz/week     Comment: Wine with meals      Allergies   Influenza virus vacc split pf; Doxycycline; and Eggs or  egg-derived products   Review of Systems Review of Systems  Constitutional: Negative for chills and fever.  HENT: Negative for ear pain and sore throat.   Eyes: Negative for pain and visual disturbance.  Respiratory: Negative for cough and shortness of breath.   Cardiovascular: Negative for chest pain, palpitations and leg swelling.  Gastrointestinal: Positive for abdominal pain (LLQ pain only with cough or twisting at waist). Negative for blood in stool, nausea and vomiting.  Genitourinary: Negative for dysuria, flank pain and hematuria.  Musculoskeletal: Positive for gait problem and myalgias. Negative for back pain and neck pain.  Skin: Negative for color change and rash.  Neurological: Negative for dizziness, seizures, syncope, weakness, numbness and headaches.     Physical Exam Updated Vital Signs BP 111/64 (BP Location: Left Arm)   Pulse 83   Temp 97.7 F (36.5 C) (Oral)   Resp 18   Ht 5\' 6"  (1.676 m)   Wt 64.9 kg   SpO2 97%   BMI 23.08 kg/m   Physical Exam  Constitutional: He appears well-developed and well-nourished.  Elderly male, lying comfortably in bed, no acute distress  HENT:  Head: Normocephalic and atraumatic.  Mouth/Throat: Oropharynx is clear and moist.  Eyes: Conjunctivae are normal.  Neck: Normal range of motion.  Cardiovascular: Normal rate, regular rhythm, normal heart sounds and intact distal pulses.   Strong DP and PT pulses bilaterally.  Pulmonary/Chest: Effort normal and breath sounds normal. No respiratory distress. He has no wheezes. He has no rales. He exhibits no tenderness.  Abdominal: Soft. Bowel sounds are normal. He exhibits no distension. There is tenderness. There is no guarding. No hernia.  Minimal TTP in LLQ. Pain reproducible with twisting at waist. No abdominal hernia present.  Musculoskeletal: Normal range of motion. He exhibits no edema or tenderness.  FROM BLE with no TTP, crepitus, or bony deformities noted. No focal tenderness  in bilateral gastrocnemius or quadriceps group. Hamstring group is tight, but NTTP. Pain reproducible with dorsiflexion, but not plantar flexion. Exaggerated arches of both feet noted, but no TTP.  Neurological: He is alert.  Normal strength in upper and lower extremities bilaterally. Sensation normal to light and sharp touch.  Skin: Skin is warm and dry.  Psychiatric: He has a normal mood and affect.  Nursing note and vitals reviewed.    ED Treatments / Results  Labs (all labs ordered are listed, but only abnormal results are displayed) Labs Reviewed - No data to display  EKG  EKG Interpretation None       Radiology Dg Chest 2 View  Result Date: 08/05/2016 CLINICAL DATA:  Recent pneumonia.  Cough and short of breath EXAM: CHEST  2 VIEW COMPARISON:  04/04/2016 FINDINGS: COPD with moderate pulmonary hyperinflation. Scarring in the bases. Negative for pneumonia. Negative for mass  or effusion. Large hiatal hernia with air-fluid level. IMPRESSION: COPD with hyperinflation and bibasilar scarring No acute cardiopulmonary abnormality Hiatal hernia Electronically Signed   By: Franchot Gallo M.D.   On: 08/05/2016 11:52    Procedures Procedures (including critical care time)  Medications Ordered in ED Medications  ketorolac (TORADOL) 30 MG/ML injection 15 mg (15 mg Intramuscular Given 08/05/16 1145)     Initial Impression / Assessment and Plan / ED Course  I have reviewed the triage vital signs and the nursing notes.  Pertinent labs & imaging results that were available during my care of the patient were reviewed by me and considered in my medical decision making (see chart for details).  Clinical Course    Patient is 82 yo M presenting with bilateral pain in the "arches" of his feet.  Recently treated for pneumonia, and states he has been sitting on the couch more instead of doing his normal walking routine. Also states he has been wearing his dress shoes recently instead of his  normal tennis shoes. On physical exam, pain is reproducible with dorsiflexion, and he is noted to have bilateral hamstring tightness and high arches of his feet. Likely musculoskeletal pain exacerbated by inactivity and change in footware. CXR ordered given recent pneumonia, but negative. Given IM Toradol which improved pain. Prior to d/c, patient ambulated with some unsteadiness, and PT consult and walker ordered. He walked much better with the walker, and cleared for d/c home. Home health orders placed for PT/OT, and patient advised to resume wearing his normal tennis shoes. Given prescription Naprosyn for pain. Advised to f/u with PCP as needed, and return precautions discussed for worsening pain or difficulty ambulating.  Final Clinical Impressions(s) / ED Diagnoses   Final diagnoses:  Bilateral foot pain  Muscle tightness    New Prescriptions New Prescriptions   NAPROXEN (NAPROSYN) 500 MG TABLET    Take 1 tablet (500 mg total) by mouth 2 (two) times daily.     [place], [place] 08/05/16 [place], MD 08/12/16 [phone]

## 2016-08-05 NOTE — ED Notes (Signed)
Bed: WA08 Expected date:  Expected time:  Means of arrival:  Comments: EMS- 82yo M, bilateral leg/ankle pain

## 2016-08-05 NOTE — ED Triage Notes (Signed)
Pt here with complaint of bilateral foot pain.  Pt advises it started 3 days ago and he is unable to walk without severe pain. Pt advises he has taken motrin yesterday without any relief.  [facility] reports pt ambulatory at scene.

## 2016-08-05 NOTE — Evaluation (Signed)
Physical Therapy Evaluation Patient Details Name: Peter Wolfe MRN: [id number] DOB: 04-18-1929 Today's Date: 08/05/2016   History of Present Illness  82 yo male admittd with bil foot pain an inability to ambulate.   Clinical Impression  On eval, pt was Min guard assist for mobility. He walked ~75'x1, 50'x1  with a RW. Pain rated 7/10 with activity. Pt reports pain very much improved compared to earlier. Discussed d/c plan-pt declined SNF rehab during session. Friend visiting pt reported pt has a cluttered apt. Pt acknowledged this as well. However, pt did state that he could safely use a RW to get to his bedroom, bathroom, and kitchen. Recommend HHPT and RW use for ambulation safety.     Follow Up Recommendations Home health PT;Supervision - Intermittent [facility] Aide, if possible)    Equipment Recommendations  Rolling walker with 5" wheels (brought to room during PT eval)    Recommendations for Other Services       Precautions / Restrictions Precautions Precautions: Fall Restrictions Weight Bearing Restrictions: No      Mobility  Bed Mobility Overal bed mobility: Needs Assistance Bed Mobility: Supine to Sit     Supine to sit: Supervision        Transfers Overall transfer level: Needs assistance Equipment used: Rolling walker (2 wheeled) Transfers: Sit to/from Stand Sit to Stand: Min guard         General transfer comment: close guard for safety. VCs hand placement  Ambulation/Gait Ambulation/Gait assistance: Min guard Ambulation Distance (Feet): 75 Feet (75' x1, 50'x1) Assistive device: Rolling walker (2 wheeled) Gait Pattern/deviations: Step-through pattern;Decreased stride length;Antalgic     General Gait Details: close guard for safety. Gait mildly antalgic to the eye but pt reported pain as 7/10. Also noted flexed knee posturing bilaterally. Fair stability with use of RW. Pt tolerated both walks well.   Stairs            Wheelchair  Mobility    Modified Rankin (Stroke Patients Only)       Balance Overall balance assessment: Needs assistance           Standing balance-Leahy Scale: Poor Standing balance comment: requires RW presently                             Pertinent Vitals/Pain Pain Assessment: 0-10 Pain Score: 7  Pain Location: bil calves, bil arches Pain Descriptors / Indicators: Aching;Sore Pain Intervention(s): Monitored during session    Home Living Family/patient expects to be discharged to:: Private residence Living Arrangements: Alone   Type of Home: Apartment Home Access: Level entry     Home Layout: One level   Additional Comments: RW issued during PT eval in ED    Prior Function Level of Independence: Independent               Hand Dominance        Extremity/Trunk Assessment   Upper Extremity Assessment: Generalized weakness           Lower Extremity Assessment: RLE deficits/detail;LLE deficits/detail RLE Deficits / Details: Knee ext 4/5, DF/PF 4/5, Knee flexion3/5 LLE Deficits / Details: Knee ext 4/5, DF/PF 4/5, Knee flexion3/5  Cervical / Trunk Assessment: Kyphotic  Communication   Communication: No difficulties  Cognition Arousal/Alertness: Awake/alert Behavior During Therapy: WFL for tasks assessed/performed Overall Cognitive Status: Within Functional Limits for tasks assessed  General Comments      Exercises     Assessment/Plan    PT Assessment Patient needs continued PT services  PT Problem List Decreased strength;Decreased mobility;Decreased balance;Decreased activity tolerance;Decreased knowledge of use of DME;Pain          PT Treatment Interventions DME instruction;Therapeutic exercise;Gait training;Balance training;Functional mobility training;Therapeutic activities;Patient/family education    PT Goals (Current goals can be found in the Care Plan section)  Acute Rehab PT Goals Patient Stated  Goal: home. less pain. regain PLOF PT Goal Formulation: With patient Time For Goal Achievement: 08/19/16 Potential to Achieve Goals: Good    Frequency Min 3X/week   Barriers to discharge        Co-evaluation               End of Session Equipment Utilized During Treatment: Gait belt Activity Tolerance: Patient tolerated treatment well Patient left: in bed;with call bell/phone within reach;with family/visitor present (sitting EOB speaking with PA)      Functional Assessment Tool Used: clinical judgement Functional Limitation: Mobility: Walking and moving around Mobility: Walking and Moving Around Current Status [id number]): At least 1 percent but less than 20 percent impaired, limited or restricted Mobility: Walking and Moving Around Goal Status [phone]): At least 1 percent but less than 20 percent impaired, limited or restricted Mobility: Walking and Moving Around Discharge Status [phone]): At least 1 percent but less than 20 percent impaired, limited or restricted    Time: 1527-1550 PT Time Calculation (min) (ACUTE ONLY): 23 min   Charges:   PT Evaluation $PT Eval Low Complexity: 1 Procedure PT Treatments $Gait Training: 8-22 mins   PT G Codes:   PT G-Codes **NOT FOR INPATIENT CLASS** Functional Assessment Tool Used: clinical judgement Functional Limitation: Mobility: Walking and moving around Mobility: Walking and Moving Around Current Status [id number]): At least 1 percent but less than 20 percent impaired, limited or restricted Mobility: Walking and Moving Around Goal Status [phone]): At least 1 percent but less than 20 percent impaired, limited or restricted Mobility: Walking and Moving Around Discharge Status [phone]): At least 1 percent but less than 20 percent impaired, limited or restricted    Weston Anna, MPT Pager: [phone]

## 2016-08-06 ENCOUNTER — Encounter: Payer: Self-pay | Admitting: Gastroenterology

## 2016-08-07 ENCOUNTER — Encounter: Payer: Medicare Other | Admitting: Adult Health

## 2016-08-09 DIAGNOSIS — F329 Major depressive disorder, single episode, unspecified: Secondary | ICD-10-CM | POA: Diagnosis not present

## 2016-08-09 DIAGNOSIS — J449 Chronic obstructive pulmonary disease, unspecified: Secondary | ICD-10-CM | POA: Diagnosis not present

## 2016-08-09 DIAGNOSIS — Z8701 Personal history of pneumonia (recurrent): Secondary | ICD-10-CM | POA: Diagnosis not present

## 2016-08-09 DIAGNOSIS — R262 Difficulty in walking, not elsewhere classified: Secondary | ICD-10-CM | POA: Diagnosis not present

## 2016-08-09 DIAGNOSIS — E785 Hyperlipidemia, unspecified: Secondary | ICD-10-CM | POA: Diagnosis not present

## 2016-08-09 DIAGNOSIS — M722 Plantar fascial fibromatosis: Secondary | ICD-10-CM | POA: Diagnosis not present

## 2016-08-09 DIAGNOSIS — I10 Essential (primary) hypertension: Secondary | ICD-10-CM | POA: Diagnosis not present

## 2016-08-09 DIAGNOSIS — K219 Gastro-esophageal reflux disease without esophagitis: Secondary | ICD-10-CM | POA: Diagnosis not present

## 2016-08-11 DIAGNOSIS — M722 Plantar fascial fibromatosis: Secondary | ICD-10-CM | POA: Diagnosis not present

## 2016-08-11 DIAGNOSIS — K219 Gastro-esophageal reflux disease without esophagitis: Secondary | ICD-10-CM | POA: Diagnosis not present

## 2016-08-11 DIAGNOSIS — J449 Chronic obstructive pulmonary disease, unspecified: Secondary | ICD-10-CM | POA: Diagnosis not present

## 2016-08-11 DIAGNOSIS — I10 Essential (primary) hypertension: Secondary | ICD-10-CM | POA: Diagnosis not present

## 2016-08-11 DIAGNOSIS — F329 Major depressive disorder, single episode, unspecified: Secondary | ICD-10-CM | POA: Diagnosis not present

## 2016-08-11 DIAGNOSIS — R262 Difficulty in walking, not elsewhere classified: Secondary | ICD-10-CM | POA: Diagnosis not present

## 2016-08-12 DIAGNOSIS — K219 Gastro-esophageal reflux disease without esophagitis: Secondary | ICD-10-CM | POA: Diagnosis not present

## 2016-08-12 DIAGNOSIS — M722 Plantar fascial fibromatosis: Secondary | ICD-10-CM | POA: Diagnosis not present

## 2016-08-12 DIAGNOSIS — J449 Chronic obstructive pulmonary disease, unspecified: Secondary | ICD-10-CM | POA: Diagnosis not present

## 2016-08-12 DIAGNOSIS — I10 Essential (primary) hypertension: Secondary | ICD-10-CM | POA: Diagnosis not present

## 2016-08-12 DIAGNOSIS — F329 Major depressive disorder, single episode, unspecified: Secondary | ICD-10-CM | POA: Diagnosis not present

## 2016-08-12 DIAGNOSIS — R262 Difficulty in walking, not elsewhere classified: Secondary | ICD-10-CM | POA: Diagnosis not present

## 2016-08-13 DIAGNOSIS — R262 Difficulty in walking, not elsewhere classified: Secondary | ICD-10-CM | POA: Diagnosis not present

## 2016-08-13 DIAGNOSIS — I10 Essential (primary) hypertension: Secondary | ICD-10-CM | POA: Diagnosis not present

## 2016-08-13 DIAGNOSIS — K219 Gastro-esophageal reflux disease without esophagitis: Secondary | ICD-10-CM | POA: Diagnosis not present

## 2016-08-13 DIAGNOSIS — F329 Major depressive disorder, single episode, unspecified: Secondary | ICD-10-CM | POA: Diagnosis not present

## 2016-08-13 DIAGNOSIS — M722 Plantar fascial fibromatosis: Secondary | ICD-10-CM | POA: Diagnosis not present

## 2016-08-13 DIAGNOSIS — J449 Chronic obstructive pulmonary disease, unspecified: Secondary | ICD-10-CM | POA: Diagnosis not present

## 2016-08-15 ENCOUNTER — Other Ambulatory Visit: Payer: Self-pay | Admitting: Family Medicine

## 2016-08-15 DIAGNOSIS — F329 Major depressive disorder, single episode, unspecified: Secondary | ICD-10-CM | POA: Diagnosis not present

## 2016-08-15 DIAGNOSIS — M722 Plantar fascial fibromatosis: Secondary | ICD-10-CM | POA: Diagnosis not present

## 2016-08-15 DIAGNOSIS — R262 Difficulty in walking, not elsewhere classified: Secondary | ICD-10-CM | POA: Diagnosis not present

## 2016-08-15 DIAGNOSIS — I10 Essential (primary) hypertension: Secondary | ICD-10-CM | POA: Diagnosis not present

## 2016-08-15 DIAGNOSIS — K219 Gastro-esophageal reflux disease without esophagitis: Secondary | ICD-10-CM | POA: Diagnosis not present

## 2016-08-15 DIAGNOSIS — J449 Chronic obstructive pulmonary disease, unspecified: Secondary | ICD-10-CM | POA: Diagnosis not present

## 2016-08-18 DIAGNOSIS — K219 Gastro-esophageal reflux disease without esophagitis: Secondary | ICD-10-CM | POA: Diagnosis not present

## 2016-08-18 DIAGNOSIS — R262 Difficulty in walking, not elsewhere classified: Secondary | ICD-10-CM | POA: Diagnosis not present

## 2016-08-18 DIAGNOSIS — F329 Major depressive disorder, single episode, unspecified: Secondary | ICD-10-CM | POA: Diagnosis not present

## 2016-08-18 DIAGNOSIS — I10 Essential (primary) hypertension: Secondary | ICD-10-CM | POA: Diagnosis not present

## 2016-08-18 DIAGNOSIS — M722 Plantar fascial fibromatosis: Secondary | ICD-10-CM | POA: Diagnosis not present

## 2016-08-18 DIAGNOSIS — J449 Chronic obstructive pulmonary disease, unspecified: Secondary | ICD-10-CM | POA: Diagnosis not present

## 2016-08-19 DIAGNOSIS — I10 Essential (primary) hypertension: Secondary | ICD-10-CM | POA: Diagnosis not present

## 2016-08-19 DIAGNOSIS — R262 Difficulty in walking, not elsewhere classified: Secondary | ICD-10-CM | POA: Diagnosis not present

## 2016-08-19 DIAGNOSIS — F329 Major depressive disorder, single episode, unspecified: Secondary | ICD-10-CM | POA: Diagnosis not present

## 2016-08-19 DIAGNOSIS — K219 Gastro-esophageal reflux disease without esophagitis: Secondary | ICD-10-CM | POA: Diagnosis not present

## 2016-08-19 DIAGNOSIS — J449 Chronic obstructive pulmonary disease, unspecified: Secondary | ICD-10-CM | POA: Diagnosis not present

## 2016-08-19 DIAGNOSIS — M722 Plantar fascial fibromatosis: Secondary | ICD-10-CM | POA: Diagnosis not present

## 2016-08-20 ENCOUNTER — Telehealth: Payer: Self-pay | Admitting: Adult Health

## 2016-08-20 NOTE — Telephone Encounter (Signed)
I spoke with Peter Wolfe.  She states she has spoke with patient about sleeping medication, and patient says he is taking Aleve PM, and it works well, and no longer needs medication.  She states that the patients home situation is awful. She states he has been wearing the same clothes for 2 weeks, and get's very angry when someone mentions to him about cleaning himself up, or a shower. 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  She also states that he is not eating like he should. He relies on church to bring him food, and eats sandwhiches all day.  Peter Wolfe also states that his vitals are good, and he does take his medications as prescribed, but she is just very worried about his living situation - Orthoptist are involved.   She would like Peter Wolfe to give patient a call to see if we can get him here in the office to talk with him, and see what Peter Wolfe recommends.   Thanks.

## 2016-08-20 NOTE — Telephone Encounter (Signed)
Attempted to call patient but there was no answer. Will call back after the weekend

## 2016-08-20 NOTE — Telephone Encounter (Signed)
° ° °  Cindy from [facility] call to say pt would like to have something that would help him sleep. Also Jenny Reichmann would to speak with Tommi Rumps about pt mental situations    [phone]

## 2016-08-22 DIAGNOSIS — K219 Gastro-esophageal reflux disease without esophagitis: Secondary | ICD-10-CM | POA: Diagnosis not present

## 2016-08-22 DIAGNOSIS — R262 Difficulty in walking, not elsewhere classified: Secondary | ICD-10-CM | POA: Diagnosis not present

## 2016-08-22 DIAGNOSIS — I10 Essential (primary) hypertension: Secondary | ICD-10-CM | POA: Diagnosis not present

## 2016-08-22 DIAGNOSIS — F329 Major depressive disorder, single episode, unspecified: Secondary | ICD-10-CM | POA: Diagnosis not present

## 2016-08-22 DIAGNOSIS — M722 Plantar fascial fibromatosis: Secondary | ICD-10-CM | POA: Diagnosis not present

## 2016-08-22 DIAGNOSIS — J449 Chronic obstructive pulmonary disease, unspecified: Secondary | ICD-10-CM | POA: Diagnosis not present

## 2016-08-25 DIAGNOSIS — R262 Difficulty in walking, not elsewhere classified: Secondary | ICD-10-CM | POA: Diagnosis not present

## 2016-08-25 DIAGNOSIS — J449 Chronic obstructive pulmonary disease, unspecified: Secondary | ICD-10-CM | POA: Diagnosis not present

## 2016-08-25 DIAGNOSIS — F329 Major depressive disorder, single episode, unspecified: Secondary | ICD-10-CM | POA: Diagnosis not present

## 2016-08-25 DIAGNOSIS — M722 Plantar fascial fibromatosis: Secondary | ICD-10-CM | POA: Diagnosis not present

## 2016-08-25 DIAGNOSIS — K219 Gastro-esophageal reflux disease without esophagitis: Secondary | ICD-10-CM | POA: Diagnosis not present

## 2016-08-25 DIAGNOSIS — I10 Essential (primary) hypertension: Secondary | ICD-10-CM | POA: Diagnosis not present

## 2016-08-27 DIAGNOSIS — I10 Essential (primary) hypertension: Secondary | ICD-10-CM | POA: Diagnosis not present

## 2016-08-27 DIAGNOSIS — K219 Gastro-esophageal reflux disease without esophagitis: Secondary | ICD-10-CM | POA: Diagnosis not present

## 2016-08-27 DIAGNOSIS — J449 Chronic obstructive pulmonary disease, unspecified: Secondary | ICD-10-CM | POA: Diagnosis not present

## 2016-08-27 DIAGNOSIS — F329 Major depressive disorder, single episode, unspecified: Secondary | ICD-10-CM | POA: Diagnosis not present

## 2016-08-27 DIAGNOSIS — M722 Plantar fascial fibromatosis: Secondary | ICD-10-CM | POA: Diagnosis not present

## 2016-08-27 DIAGNOSIS — R262 Difficulty in walking, not elsewhere classified: Secondary | ICD-10-CM | POA: Diagnosis not present

## 2016-08-28 DIAGNOSIS — K219 Gastro-esophageal reflux disease without esophagitis: Secondary | ICD-10-CM | POA: Diagnosis not present

## 2016-08-28 DIAGNOSIS — F329 Major depressive disorder, single episode, unspecified: Secondary | ICD-10-CM | POA: Diagnosis not present

## 2016-08-28 DIAGNOSIS — M722 Plantar fascial fibromatosis: Secondary | ICD-10-CM | POA: Diagnosis not present

## 2016-08-28 DIAGNOSIS — J449 Chronic obstructive pulmonary disease, unspecified: Secondary | ICD-10-CM | POA: Diagnosis not present

## 2016-08-28 DIAGNOSIS — I10 Essential (primary) hypertension: Secondary | ICD-10-CM | POA: Diagnosis not present

## 2016-08-28 DIAGNOSIS — R262 Difficulty in walking, not elsewhere classified: Secondary | ICD-10-CM | POA: Diagnosis not present

## 2016-08-29 ENCOUNTER — Encounter: Payer: Self-pay | Admitting: Adult Health

## 2016-08-29 ENCOUNTER — Ambulatory Visit (INDEPENDENT_AMBULATORY_CARE_PROVIDER_SITE_OTHER): Payer: Medicare Other | Admitting: Adult Health

## 2016-08-29 VITALS — BP 118/64 | Temp 97.5°F | Ht 66.0 in | Wt 133.4 lb

## 2016-08-29 DIAGNOSIS — R634 Abnormal weight loss: Secondary | ICD-10-CM

## 2016-08-29 DIAGNOSIS — F331 Major depressive disorder, recurrent, moderate: Secondary | ICD-10-CM

## 2016-08-29 DIAGNOSIS — G479 Sleep disorder, unspecified: Secondary | ICD-10-CM | POA: Diagnosis not present

## 2016-08-29 MED ORDER — MIRTAZAPINE 30 MG PO TABS: 30.0000 mg | ORAL_TABLET | Freq: Every day | ORAL | 1 refills | Status: DC

## 2016-08-29 MED ORDER — FLUOXETINE HCL 40 MG PO CAPS: 40.0000 mg | ORAL_CAPSULE | Freq: Every day | ORAL | 1 refills | Status: DC

## 2016-08-29 NOTE — Addendum Note (Signed)
Addended by: Apolinar Junes on: 08/29/2016 11:02 AM   Modules accepted: Orders

## 2016-08-29 NOTE — Progress Notes (Addendum)
Subjective:    Patient ID: Peter Wolfe, male    DOB: 07/14/1929, 82 y.o.   MRN: [id number]  HPI  Peter Wolfe is an 82 year old male who  has a past medical history of BPH (benign prostatic hyperplasia); Broken rib; COPD (chronic obstructive pulmonary disease) (HCC); GERD (gastroesophageal reflux disease); GERD (gastroesophageal reflux disease); Hyperlipidemia; Hypertension; Insomnia; and Panic attack. He presents to the office today to being seen in the urgency room on 08/05/2016 for bilateral foot pain. At that time patient had been wearing dress shoes and cerebrospinal tennis shoes when he complained of pain in the "arches" of his feet and is reproducible with dorsiflexion in the emergency room. He was given an IM shot of Toradol as well as prescription for naproxen 500 mg. The emergency room the patient ambulated with some unsteadiness so a walker was ordered as well as physical therapy consult. He has a walker today in the office and is able to ambulate with a slow steady gait he reports that his bilateral foot pain has improved slightly. He has home health and physical therapy coming up to his apartment.   I received messages from his home health team as her concerned about the patient's welfare. Per home health patient has been wearing the same) approximately 3 weeks, he is not showering, and his apartment is in terrible condition as he is on order. He relies on his church to bring him food and eats sandwiches all day. Some days he will not let occupational therapy come into the home to see him  Today the office he presents complaining of severe anxiety and depression. He continues to lose weight which is concerning to him. He does report that he is taking his medications as directed, daily. Does not feel acutely ill at this point in time and vitals signs are stable he just feels as though his anxiety and depression is "getting the best of him".  Wt Readings from Last 3 Encounters:  08/29/16  133 lb 6.4 oz (60.5 kg)  08/05/16 143 lb (64.9 kg)  07/28/16 143 lb 8 oz (65.1 kg)      Review of Systems  Constitutional: Positive for activity change and fatigue.  Respiratory: Negative.   Cardiovascular: Negative.   Gastrointestinal: Positive for constipation. Negative for nausea and vomiting.  Musculoskeletal: Positive for arthralgias, back pain and gait problem.  Skin: Negative.        Dry skin throughout body   Psychiatric/Behavioral: Positive for sleep disturbance. Negative for hallucinations, self-injury and suicidal ideas. The patient is nervous/anxious. The patient is not hyperactive.        Depression  All other systems reviewed and are negative.  Past Medical History:  Diagnosis Date  . BPH (benign prostatic hyperplasia)   . Broken rib   . COPD (chronic obstructive pulmonary disease) ([place])   . GERD (gastroesophageal reflux disease)   . GERD (gastroesophageal reflux disease)   . Hyperlipidemia   . Hypertension   . Insomnia   . Panic attack     Social History   Social History  . Marital status: Single    Spouse name: N/A  . Number of children: N/A  . Years of education: N/A   Occupational History  . Not on file.   Social History Main Topics  . Smoking status: Never Smoker  . Smokeless tobacco: Never Used  . Alcohol use 0.0 oz/week     Comment: Wine with meals   . Drug use: No  . Sexual activity: Not  on file   Other Topics Concern  . Not on file   Social History Narrative   Retired - Sgt, Major    Not married              Past Surgical History:  Procedure Laterality Date  . CHOLECYSTECTOMY      Family History  Problem Relation Age of Onset  . Emphysema Father   . Alcohol abuse Father   . Diabetes Sister     Allergies  Allergen Reactions  . Influenza Virus Vacc Split Pf     REACTION: allergic to eggs  . Doxycycline Rash  . Eggs Or Egg-Derived Products Rash    Current Outpatient Prescriptions on File Prior to Visit  Medication  Sig Dispense Refill  . albuterol (PROVENTIL HFA;VENTOLIN HFA) 108 (90 Base) MCG/ACT inhaler Inhale 2 puffs into the lungs every 6 (six) hours as needed. 1 Inhaler 0  . busPIRone (BUSPAR) 30 MG tablet TAKE 1 TABLET DAILY (Patient taking differently: TAKE 30mg  by mouth once DAILY) 100 tablet 3  . cloNIDine (CATAPRES) 0.1 MG tablet TAKE 1 TABLET DAILY (Patient taking differently: TAKE 0.1mg  by mouth once DAILY) 100 tablet 3  . levofloxacin (LEVAQUIN) 500 MG tablet Take 1 tablet (500 mg total) by mouth daily. 7 tablet 0  . Multiple Vitamin (MULTIVITAMIN) capsule Take 1 capsule by mouth daily.    . naproxen (NAPROSYN) 500 MG tablet Take 1 tablet (500 mg total) by mouth 2 (two) times daily. 30 tablet 0  . omeprazole (PRILOSEC) 20 MG capsule TAKE 1 CAPSULE DAILY (Patient taking differently: TAKE 20mg  CAPSULE by mouth once DAILY) 100 capsule 3  . simvastatin (ZOCOR) 10 MG tablet TAKE 1 TABLET AT BEDTIME (Patient taking differently: TAKE 10mg  by mouth once daily) 100 tablet 3  . tamsulosin (FLOMAX) 0.4 MG CAPS capsule TAKE 1 CAPSULE DAILY (Patient taking differently: TAKE 0.4mg  CAPSULE by mouth once DAILY) 100 capsule 3  . traZODone (DESYREL) 100 MG tablet TAKE 1 TABLET AT BEDTIME 100 tablet 3  . triamterene-hydrochlorothiazide (MAXZIDE) 75-50 MG tablet TAKE 1 TABLET DAILY 100 tablet 2   No current facility-administered medications on file prior to visit.     BP 118/64   Temp 97.5 F (36.4 C) (Oral)   Ht 5\' 6"  (1.676 m)   Wt 133 lb 6.4 oz (60.5 kg)   BMI 21.53 kg/m        Objective:   Physical Exam  Constitutional: He is oriented to person, place, and time. Vital signs are normal. He appears well-developed. He appears cachectic. No distress.  Ill kept. Food stained clothes and significant body odor   Cardiovascular: Normal rate, regular rhythm, normal heart sounds and intact distal pulses.  Exam reveals no gallop and no friction rub.   No murmur heard. Pulmonary/Chest: Effort normal and  breath sounds normal.  Abdominal: Soft. Bowel sounds are normal. He exhibits no distension and no mass. There is no tenderness. There is no rebound and no guarding.  Neurological: He is alert and oriented to person, place, and time.  Skin: Skin is warm and dry. No rash noted. He is not diaphoretic. No erythema. No pallor.  Psychiatric: His speech is normal and behavior is normal. Judgment and thought content normal. He exhibits a depressed mood.  Nursing note and vitals reviewed.      Assessment & Plan:  1. Moderate episode of recurrent major depressive disorder (HCC) - We'll increase Remeron from 15 mg 30 mg of Prozac from 20 mg to 40  mg daily.  - Home health is supposedly getting social services involved in patient's care. Feels though he'll probably be better off in the rehabilitation facility long-term than at home. - We'll work with home health for proper placement of the patient. I been able to get a hold of home health at this time  FLUoxetine (PROZAC) 40 MG capsule; Take 1 capsule (40 mg total) by mouth daily.  Dispense: 90 capsule; Refill: 1 - mirtazapine (REMERON) 30 MG tablet; Take 1 tablet (30 mg total) by mouth at bedtime.  Dispense: 90 tablet; Refill: 1 -Follow up in one month  2. Sleep disturbance - Will increase Remeron from 15 mg a 30 mg - mirtazapine (REMERON) 30 MG tablet; Take 1 tablet (30 mg total) by mouth at bedtime.  Dispense: 90 tablet; Refill: 1 - Follow up in one month  3. Loss of weight  - Likely due to poor nutritional status. Advised the patient that he needs to start increasing foods high in protein and start eating a balanced diet - mirtazapine (REMERON) 30 MG tablet; Take 1 tablet (30 mg total) by mouth at bedtime.  Dispense: 90 tablet; Refill: 1  [name], NP

## 2016-08-29 NOTE — Patient Instructions (Signed)
It was great seeing you today!  I have increased your Remeron to 30 mg at bedtime and the Prozac to 40 mg every morning.   Follow up with Psychiatry   Please follow up with me in one month or sooner if needed  Please start taking a bath and changing your clothes

## 2016-08-31 DIAGNOSIS — F329 Major depressive disorder, single episode, unspecified: Secondary | ICD-10-CM | POA: Diagnosis not present

## 2016-08-31 DIAGNOSIS — R262 Difficulty in walking, not elsewhere classified: Secondary | ICD-10-CM | POA: Diagnosis not present

## 2016-08-31 DIAGNOSIS — K219 Gastro-esophageal reflux disease without esophagitis: Secondary | ICD-10-CM | POA: Diagnosis not present

## 2016-08-31 DIAGNOSIS — J449 Chronic obstructive pulmonary disease, unspecified: Secondary | ICD-10-CM | POA: Diagnosis not present

## 2016-08-31 DIAGNOSIS — I10 Essential (primary) hypertension: Secondary | ICD-10-CM | POA: Diagnosis not present

## 2016-08-31 DIAGNOSIS — M722 Plantar fascial fibromatosis: Secondary | ICD-10-CM | POA: Diagnosis not present

## 2016-09-02 ENCOUNTER — Telehealth: Payer: Self-pay | Admitting: Adult Health

## 2016-09-02 DIAGNOSIS — K219 Gastro-esophageal reflux disease without esophagitis: Secondary | ICD-10-CM | POA: Diagnosis not present

## 2016-09-02 DIAGNOSIS — I10 Essential (primary) hypertension: Secondary | ICD-10-CM | POA: Diagnosis not present

## 2016-09-02 DIAGNOSIS — J449 Chronic obstructive pulmonary disease, unspecified: Secondary | ICD-10-CM | POA: Diagnosis not present

## 2016-09-02 DIAGNOSIS — R262 Difficulty in walking, not elsewhere classified: Secondary | ICD-10-CM | POA: Diagnosis not present

## 2016-09-02 DIAGNOSIS — M722 Plantar fascial fibromatosis: Secondary | ICD-10-CM | POA: Diagnosis not present

## 2016-09-02 DIAGNOSIS — F329 Major depressive disorder, single episode, unspecified: Secondary | ICD-10-CM | POA: Diagnosis not present

## 2016-09-02 NOTE — Telephone Encounter (Signed)
Pt decided to bath on his on over the weekend and had a fall on Saturday after getting out of the tub.  He tried to sit on the toilet and slipped and fell into the tub.  He has bruising on his left torso and left arm.  Pt is now open to Occupational therapy and Therapist advised she can take a verbal order.  She can be reached back at the [phone].

## 2016-09-02 NOTE — Telephone Encounter (Signed)
Juliann Pulse notified of Cory's comments.

## 2016-09-02 NOTE — Telephone Encounter (Signed)
Kendall for verbal orders.   Please have him follow up if he feels like he needs

## 2016-09-02 NOTE — Telephone Encounter (Signed)
See below

## 2016-09-08 DIAGNOSIS — R262 Difficulty in walking, not elsewhere classified: Secondary | ICD-10-CM | POA: Diagnosis not present

## 2016-09-08 DIAGNOSIS — K219 Gastro-esophageal reflux disease without esophagitis: Secondary | ICD-10-CM | POA: Diagnosis not present

## 2016-09-08 DIAGNOSIS — M722 Plantar fascial fibromatosis: Secondary | ICD-10-CM | POA: Diagnosis not present

## 2016-09-08 DIAGNOSIS — I10 Essential (primary) hypertension: Secondary | ICD-10-CM | POA: Diagnosis not present

## 2016-09-08 DIAGNOSIS — J449 Chronic obstructive pulmonary disease, unspecified: Secondary | ICD-10-CM | POA: Diagnosis not present

## 2016-09-08 DIAGNOSIS — F329 Major depressive disorder, single episode, unspecified: Secondary | ICD-10-CM | POA: Diagnosis not present

## 2016-09-09 DIAGNOSIS — I10 Essential (primary) hypertension: Secondary | ICD-10-CM | POA: Diagnosis not present

## 2016-09-09 DIAGNOSIS — F329 Major depressive disorder, single episode, unspecified: Secondary | ICD-10-CM | POA: Diagnosis not present

## 2016-09-09 DIAGNOSIS — M722 Plantar fascial fibromatosis: Secondary | ICD-10-CM | POA: Diagnosis not present

## 2016-09-09 DIAGNOSIS — K219 Gastro-esophageal reflux disease without esophagitis: Secondary | ICD-10-CM | POA: Diagnosis not present

## 2016-09-09 DIAGNOSIS — R262 Difficulty in walking, not elsewhere classified: Secondary | ICD-10-CM | POA: Diagnosis not present

## 2016-09-09 DIAGNOSIS — J449 Chronic obstructive pulmonary disease, unspecified: Secondary | ICD-10-CM | POA: Diagnosis not present

## 2016-09-10 DIAGNOSIS — F329 Major depressive disorder, single episode, unspecified: Secondary | ICD-10-CM | POA: Diagnosis not present

## 2016-09-10 DIAGNOSIS — R262 Difficulty in walking, not elsewhere classified: Secondary | ICD-10-CM | POA: Diagnosis not present

## 2016-09-10 DIAGNOSIS — J449 Chronic obstructive pulmonary disease, unspecified: Secondary | ICD-10-CM | POA: Diagnosis not present

## 2016-09-10 DIAGNOSIS — I10 Essential (primary) hypertension: Secondary | ICD-10-CM | POA: Diagnosis not present

## 2016-09-10 DIAGNOSIS — K219 Gastro-esophageal reflux disease without esophagitis: Secondary | ICD-10-CM | POA: Diagnosis not present

## 2016-09-10 DIAGNOSIS — M722 Plantar fascial fibromatosis: Secondary | ICD-10-CM | POA: Diagnosis not present

## 2016-09-15 ENCOUNTER — Telehealth: Payer: Self-pay | Admitting: Adult Health

## 2016-09-15 DIAGNOSIS — R262 Difficulty in walking, not elsewhere classified: Secondary | ICD-10-CM | POA: Diagnosis not present

## 2016-09-15 DIAGNOSIS — J449 Chronic obstructive pulmonary disease, unspecified: Secondary | ICD-10-CM | POA: Diagnosis not present

## 2016-09-15 DIAGNOSIS — I10 Essential (primary) hypertension: Secondary | ICD-10-CM | POA: Diagnosis not present

## 2016-09-15 DIAGNOSIS — F329 Major depressive disorder, single episode, unspecified: Secondary | ICD-10-CM | POA: Diagnosis not present

## 2016-09-15 DIAGNOSIS — M722 Plantar fascial fibromatosis: Secondary | ICD-10-CM | POA: Diagnosis not present

## 2016-09-15 DIAGNOSIS — K219 Gastro-esophageal reflux disease without esophagitis: Secondary | ICD-10-CM | POA: Diagnosis not present

## 2016-09-15 NOTE — Telephone Encounter (Signed)
Left voicemail letting Juliann Pulse know verbal orders are okay.

## 2016-09-15 NOTE — Telephone Encounter (Signed)
Ok for verbal order  °

## 2016-09-15 NOTE — Telephone Encounter (Signed)
The pts PT is getting ready to expire and Kathy,PT w/[facility] would like to have verbal order to work with the pt for strength, balance and gait.

## 2016-09-16 DIAGNOSIS — J449 Chronic obstructive pulmonary disease, unspecified: Secondary | ICD-10-CM | POA: Diagnosis not present

## 2016-09-16 DIAGNOSIS — K219 Gastro-esophageal reflux disease without esophagitis: Secondary | ICD-10-CM | POA: Diagnosis not present

## 2016-09-16 DIAGNOSIS — I10 Essential (primary) hypertension: Secondary | ICD-10-CM | POA: Diagnosis not present

## 2016-09-16 DIAGNOSIS — R262 Difficulty in walking, not elsewhere classified: Secondary | ICD-10-CM | POA: Diagnosis not present

## 2016-09-16 DIAGNOSIS — F329 Major depressive disorder, single episode, unspecified: Secondary | ICD-10-CM | POA: Diagnosis not present

## 2016-09-16 DIAGNOSIS — M722 Plantar fascial fibromatosis: Secondary | ICD-10-CM | POA: Diagnosis not present

## 2016-09-17 DIAGNOSIS — M722 Plantar fascial fibromatosis: Secondary | ICD-10-CM | POA: Diagnosis not present

## 2016-09-17 DIAGNOSIS — F329 Major depressive disorder, single episode, unspecified: Secondary | ICD-10-CM | POA: Diagnosis not present

## 2016-09-17 DIAGNOSIS — R262 Difficulty in walking, not elsewhere classified: Secondary | ICD-10-CM | POA: Diagnosis not present

## 2016-09-17 DIAGNOSIS — K219 Gastro-esophageal reflux disease without esophagitis: Secondary | ICD-10-CM | POA: Diagnosis not present

## 2016-09-17 DIAGNOSIS — J449 Chronic obstructive pulmonary disease, unspecified: Secondary | ICD-10-CM | POA: Diagnosis not present

## 2016-09-17 DIAGNOSIS — I10 Essential (primary) hypertension: Secondary | ICD-10-CM | POA: Diagnosis not present

## 2016-09-18 DIAGNOSIS — M722 Plantar fascial fibromatosis: Secondary | ICD-10-CM | POA: Diagnosis not present

## 2016-09-18 DIAGNOSIS — K219 Gastro-esophageal reflux disease without esophagitis: Secondary | ICD-10-CM | POA: Diagnosis not present

## 2016-09-18 DIAGNOSIS — F329 Major depressive disorder, single episode, unspecified: Secondary | ICD-10-CM | POA: Diagnosis not present

## 2016-09-18 DIAGNOSIS — R262 Difficulty in walking, not elsewhere classified: Secondary | ICD-10-CM | POA: Diagnosis not present

## 2016-09-18 DIAGNOSIS — J449 Chronic obstructive pulmonary disease, unspecified: Secondary | ICD-10-CM | POA: Diagnosis not present

## 2016-09-18 DIAGNOSIS — I10 Essential (primary) hypertension: Secondary | ICD-10-CM | POA: Diagnosis not present

## 2016-09-19 DIAGNOSIS — M722 Plantar fascial fibromatosis: Secondary | ICD-10-CM | POA: Diagnosis not present

## 2016-09-19 DIAGNOSIS — R262 Difficulty in walking, not elsewhere classified: Secondary | ICD-10-CM | POA: Diagnosis not present

## 2016-09-19 DIAGNOSIS — J449 Chronic obstructive pulmonary disease, unspecified: Secondary | ICD-10-CM | POA: Diagnosis not present

## 2016-09-19 DIAGNOSIS — K219 Gastro-esophageal reflux disease without esophagitis: Secondary | ICD-10-CM | POA: Diagnosis not present

## 2016-09-19 DIAGNOSIS — I10 Essential (primary) hypertension: Secondary | ICD-10-CM | POA: Diagnosis not present

## 2016-09-19 DIAGNOSIS — F329 Major depressive disorder, single episode, unspecified: Secondary | ICD-10-CM | POA: Diagnosis not present

## 2016-09-25 DIAGNOSIS — R262 Difficulty in walking, not elsewhere classified: Secondary | ICD-10-CM | POA: Diagnosis not present

## 2016-09-25 DIAGNOSIS — K219 Gastro-esophageal reflux disease without esophagitis: Secondary | ICD-10-CM | POA: Diagnosis not present

## 2016-09-25 DIAGNOSIS — F329 Major depressive disorder, single episode, unspecified: Secondary | ICD-10-CM | POA: Diagnosis not present

## 2016-09-25 DIAGNOSIS — M722 Plantar fascial fibromatosis: Secondary | ICD-10-CM | POA: Diagnosis not present

## 2016-09-25 DIAGNOSIS — J449 Chronic obstructive pulmonary disease, unspecified: Secondary | ICD-10-CM | POA: Diagnosis not present

## 2016-09-25 DIAGNOSIS — I10 Essential (primary) hypertension: Secondary | ICD-10-CM | POA: Diagnosis not present

## 2016-09-26 ENCOUNTER — Ambulatory Visit (INDEPENDENT_AMBULATORY_CARE_PROVIDER_SITE_OTHER): Payer: Medicare Other | Admitting: Adult Health

## 2016-09-26 DIAGNOSIS — G479 Sleep disorder, unspecified: Secondary | ICD-10-CM | POA: Diagnosis not present

## 2016-09-26 DIAGNOSIS — F331 Major depressive disorder, recurrent, moderate: Secondary | ICD-10-CM | POA: Diagnosis not present

## 2016-09-26 DIAGNOSIS — R634 Abnormal weight loss: Secondary | ICD-10-CM

## 2016-09-26 MED ORDER — MIRTAZAPINE 45 MG PO TABS: 45.0000 mg | ORAL_TABLET | Freq: Every day | ORAL | 3 refills | Status: DC

## 2016-09-26 NOTE — Progress Notes (Signed)
   Subjective:    Patient ID: Peter Wolfe, male    DOB: 05-04-1929, 82 y.o.   MRN: [id number]  HPI  82 year old male who presents to the office today for one month follow up after medication adjustment of Remeron and Prozac. He reports that his weight continues to be in the 130's but feels as though his appetite has started to increase. He continues to have trouble falling asleep  - he takes Remeron at 7 pm and is often still awake at midnight. When he does fall asleep he is able to get good sleep.   He is currently doing strength and balance training. He feels as though  He is making progress but continues to feel off balance. He is using his walker.   He has also gotten a shower chair and is able to shower by himself. Wilburn Mylar was his first shower since mid December when he attempted to shower and fell in his tub.   He has not noticed much difference in his depression    Review of Systems     Objective:   Physical Exam  Constitutional: He is oriented to person, place, and time. He appears well-developed. He appears cachectic. No distress.  HENT:  Head: Normocephalic and atraumatic.  Right Ear: External ear normal.  Left Ear: External ear normal.  Nose: Nose normal.  Mouth/Throat: Oropharynx is clear and moist. No oropharyngeal exudate.  Cardiovascular: Normal rate, regular rhythm, normal heart sounds and intact distal pulses.  Exam reveals no gallop and no friction rub.   No murmur heard. Pulmonary/Chest: Effort normal and breath sounds normal. No respiratory distress. He has no wheezes. He has no rales. He exhibits no tenderness.  Musculoskeletal:  Is able to stand from sitting position without difficulty. He is off balance after standing for a short time. Has steady gait with walker  Neurological: He is alert and oriented to person, place, and time. He has normal reflexes. No cranial nerve deficit. He exhibits normal muscle tone. Coordination normal.  Skin: Skin is warm  and dry. No rash noted. He is not diaphoretic. No erythema. No pallor.  Psychiatric: He has a normal mood and affect. His behavior is normal. Judgment and thought content normal.  Nursing note and vitals reviewed.      Assessment & Plan:  1. Moderate episode of recurrent major depressive disorder (HCC) - Continue with Prozac. His overall demeanor and appearance shows improvement.  - mirtazapine (REMERON) 45 MG tablet; Take 1 tablet (45 mg total) by mouth at bedtime.  Dispense: 30 tablet; Refill: 3   2. Sleep disturbance - Will increase Remeron from 30 mg to 45 mg  - mirtazapine (REMERON) 45 MG tablet; Take 1 tablet (45 mg total) by mouth at bedtime.  Dispense: 30 tablet; Refill: 3  3. Loss of weight - Advised him that if he continues to eat just sandwiches and wait for the church to feed him that he will continue to lose weight. He is going to go to [employer] and get food to cook.  - mirtazapine (REMERON) 45 MG tablet; Take 1 tablet (45 mg total) by mouth at bedtime.  Dispense: 30 tablet; Refill: 3 - Continue with OT/PT therapy  - Follow up in one month   [name], NP

## 2016-09-28 DIAGNOSIS — M722 Plantar fascial fibromatosis: Secondary | ICD-10-CM | POA: Diagnosis not present

## 2016-09-28 DIAGNOSIS — I10 Essential (primary) hypertension: Secondary | ICD-10-CM | POA: Diagnosis not present

## 2016-09-28 DIAGNOSIS — J449 Chronic obstructive pulmonary disease, unspecified: Secondary | ICD-10-CM | POA: Diagnosis not present

## 2016-09-28 DIAGNOSIS — K219 Gastro-esophageal reflux disease without esophagitis: Secondary | ICD-10-CM | POA: Diagnosis not present

## 2016-09-28 DIAGNOSIS — F329 Major depressive disorder, single episode, unspecified: Secondary | ICD-10-CM | POA: Diagnosis not present

## 2016-09-28 DIAGNOSIS — R262 Difficulty in walking, not elsewhere classified: Secondary | ICD-10-CM | POA: Diagnosis not present

## 2016-10-01 DIAGNOSIS — K219 Gastro-esophageal reflux disease without esophagitis: Secondary | ICD-10-CM | POA: Diagnosis not present

## 2016-10-01 DIAGNOSIS — F329 Major depressive disorder, single episode, unspecified: Secondary | ICD-10-CM | POA: Diagnosis not present

## 2016-10-01 DIAGNOSIS — M722 Plantar fascial fibromatosis: Secondary | ICD-10-CM | POA: Diagnosis not present

## 2016-10-01 DIAGNOSIS — I10 Essential (primary) hypertension: Secondary | ICD-10-CM | POA: Diagnosis not present

## 2016-10-01 DIAGNOSIS — J449 Chronic obstructive pulmonary disease, unspecified: Secondary | ICD-10-CM | POA: Diagnosis not present

## 2016-10-01 DIAGNOSIS — R262 Difficulty in walking, not elsewhere classified: Secondary | ICD-10-CM | POA: Diagnosis not present

## 2016-10-06 ENCOUNTER — Telehealth: Payer: Self-pay | Admitting: Adult Health

## 2016-10-06 DIAGNOSIS — I10 Essential (primary) hypertension: Secondary | ICD-10-CM | POA: Diagnosis not present

## 2016-10-06 DIAGNOSIS — M722 Plantar fascial fibromatosis: Secondary | ICD-10-CM | POA: Diagnosis not present

## 2016-10-06 DIAGNOSIS — K219 Gastro-esophageal reflux disease without esophagitis: Secondary | ICD-10-CM | POA: Diagnosis not present

## 2016-10-06 DIAGNOSIS — J449 Chronic obstructive pulmonary disease, unspecified: Secondary | ICD-10-CM | POA: Diagnosis not present

## 2016-10-06 DIAGNOSIS — F329 Major depressive disorder, single episode, unspecified: Secondary | ICD-10-CM | POA: Diagnosis not present

## 2016-10-06 DIAGNOSIS — R262 Difficulty in walking, not elsewhere classified: Secondary | ICD-10-CM | POA: Diagnosis not present

## 2016-10-06 NOTE — Telephone Encounter (Signed)
Asking for recert to continue with home health physical therapy. Vonna Drafts is the Therapist and her best number is [phone]

## 2016-10-07 DIAGNOSIS — F329 Major depressive disorder, single episode, unspecified: Secondary | ICD-10-CM | POA: Diagnosis not present

## 2016-10-07 DIAGNOSIS — M722 Plantar fascial fibromatosis: Secondary | ICD-10-CM | POA: Diagnosis not present

## 2016-10-07 DIAGNOSIS — R262 Difficulty in walking, not elsewhere classified: Secondary | ICD-10-CM | POA: Diagnosis not present

## 2016-10-07 DIAGNOSIS — K219 Gastro-esophageal reflux disease without esophagitis: Secondary | ICD-10-CM | POA: Diagnosis not present

## 2016-10-07 DIAGNOSIS — J449 Chronic obstructive pulmonary disease, unspecified: Secondary | ICD-10-CM | POA: Diagnosis not present

## 2016-10-07 DIAGNOSIS — I10 Essential (primary) hypertension: Secondary | ICD-10-CM | POA: Diagnosis not present

## 2016-10-07 NOTE — Telephone Encounter (Signed)
Ok to continue PT.

## 2016-10-07 NOTE — Telephone Encounter (Signed)
Juliann Pulse notified of verbal order to continue PT

## 2016-10-08 DIAGNOSIS — J449 Chronic obstructive pulmonary disease, unspecified: Secondary | ICD-10-CM | POA: Diagnosis not present

## 2016-10-08 DIAGNOSIS — K219 Gastro-esophageal reflux disease without esophagitis: Secondary | ICD-10-CM | POA: Diagnosis not present

## 2016-10-08 DIAGNOSIS — F329 Major depressive disorder, single episode, unspecified: Secondary | ICD-10-CM | POA: Diagnosis not present

## 2016-10-08 DIAGNOSIS — Z8701 Personal history of pneumonia (recurrent): Secondary | ICD-10-CM | POA: Diagnosis not present

## 2016-10-08 DIAGNOSIS — S96811D Strain of other specified muscles and tendons at ankle and foot level, right foot, subsequent encounter: Secondary | ICD-10-CM | POA: Diagnosis not present

## 2016-10-08 DIAGNOSIS — R262 Difficulty in walking, not elsewhere classified: Secondary | ICD-10-CM | POA: Diagnosis not present

## 2016-10-08 DIAGNOSIS — M722 Plantar fascial fibromatosis: Secondary | ICD-10-CM | POA: Diagnosis not present

## 2016-10-08 DIAGNOSIS — S96812D Strain of other specified muscles and tendons at ankle and foot level, left foot, subsequent encounter: Secondary | ICD-10-CM | POA: Diagnosis not present

## 2016-10-08 DIAGNOSIS — I10 Essential (primary) hypertension: Secondary | ICD-10-CM | POA: Diagnosis not present

## 2016-10-08 DIAGNOSIS — E785 Hyperlipidemia, unspecified: Secondary | ICD-10-CM | POA: Diagnosis not present

## 2016-10-09 DIAGNOSIS — R262 Difficulty in walking, not elsewhere classified: Secondary | ICD-10-CM | POA: Diagnosis not present

## 2016-10-09 DIAGNOSIS — I10 Essential (primary) hypertension: Secondary | ICD-10-CM | POA: Diagnosis not present

## 2016-10-09 DIAGNOSIS — J449 Chronic obstructive pulmonary disease, unspecified: Secondary | ICD-10-CM | POA: Diagnosis not present

## 2016-10-09 DIAGNOSIS — M722 Plantar fascial fibromatosis: Secondary | ICD-10-CM | POA: Diagnosis not present

## 2016-10-09 DIAGNOSIS — S96812D Strain of other specified muscles and tendons at ankle and foot level, left foot, subsequent encounter: Secondary | ICD-10-CM | POA: Diagnosis not present

## 2016-10-09 DIAGNOSIS — S96811D Strain of other specified muscles and tendons at ankle and foot level, right foot, subsequent encounter: Secondary | ICD-10-CM | POA: Diagnosis not present

## 2016-10-14 DIAGNOSIS — R262 Difficulty in walking, not elsewhere classified: Secondary | ICD-10-CM | POA: Diagnosis not present

## 2016-10-14 DIAGNOSIS — I10 Essential (primary) hypertension: Secondary | ICD-10-CM | POA: Diagnosis not present

## 2016-10-14 DIAGNOSIS — S96811D Strain of other specified muscles and tendons at ankle and foot level, right foot, subsequent encounter: Secondary | ICD-10-CM | POA: Diagnosis not present

## 2016-10-14 DIAGNOSIS — J449 Chronic obstructive pulmonary disease, unspecified: Secondary | ICD-10-CM | POA: Diagnosis not present

## 2016-10-14 DIAGNOSIS — S96812D Strain of other specified muscles and tendons at ankle and foot level, left foot, subsequent encounter: Secondary | ICD-10-CM | POA: Diagnosis not present

## 2016-10-14 DIAGNOSIS — M722 Plantar fascial fibromatosis: Secondary | ICD-10-CM | POA: Diagnosis not present

## 2016-10-15 DIAGNOSIS — S96812D Strain of other specified muscles and tendons at ankle and foot level, left foot, subsequent encounter: Secondary | ICD-10-CM | POA: Diagnosis not present

## 2016-10-15 DIAGNOSIS — I10 Essential (primary) hypertension: Secondary | ICD-10-CM | POA: Diagnosis not present

## 2016-10-15 DIAGNOSIS — S96811D Strain of other specified muscles and tendons at ankle and foot level, right foot, subsequent encounter: Secondary | ICD-10-CM | POA: Diagnosis not present

## 2016-10-15 DIAGNOSIS — M722 Plantar fascial fibromatosis: Secondary | ICD-10-CM | POA: Diagnosis not present

## 2016-10-15 DIAGNOSIS — R262 Difficulty in walking, not elsewhere classified: Secondary | ICD-10-CM | POA: Diagnosis not present

## 2016-10-15 DIAGNOSIS — J449 Chronic obstructive pulmonary disease, unspecified: Secondary | ICD-10-CM | POA: Diagnosis not present

## 2016-10-20 DIAGNOSIS — I10 Essential (primary) hypertension: Secondary | ICD-10-CM | POA: Diagnosis not present

## 2016-10-20 DIAGNOSIS — S96811D Strain of other specified muscles and tendons at ankle and foot level, right foot, subsequent encounter: Secondary | ICD-10-CM | POA: Diagnosis not present

## 2016-10-20 DIAGNOSIS — R262 Difficulty in walking, not elsewhere classified: Secondary | ICD-10-CM | POA: Diagnosis not present

## 2016-10-20 DIAGNOSIS — J449 Chronic obstructive pulmonary disease, unspecified: Secondary | ICD-10-CM | POA: Diagnosis not present

## 2016-10-20 DIAGNOSIS — M722 Plantar fascial fibromatosis: Secondary | ICD-10-CM | POA: Diagnosis not present

## 2016-10-20 DIAGNOSIS — S96812D Strain of other specified muscles and tendons at ankle and foot level, left foot, subsequent encounter: Secondary | ICD-10-CM | POA: Diagnosis not present

## 2016-10-22 DIAGNOSIS — M722 Plantar fascial fibromatosis: Secondary | ICD-10-CM | POA: Diagnosis not present

## 2016-10-22 DIAGNOSIS — S96811D Strain of other specified muscles and tendons at ankle and foot level, right foot, subsequent encounter: Secondary | ICD-10-CM | POA: Diagnosis not present

## 2016-10-22 DIAGNOSIS — S96812D Strain of other specified muscles and tendons at ankle and foot level, left foot, subsequent encounter: Secondary | ICD-10-CM | POA: Diagnosis not present

## 2016-10-22 DIAGNOSIS — J449 Chronic obstructive pulmonary disease, unspecified: Secondary | ICD-10-CM | POA: Diagnosis not present

## 2016-10-22 DIAGNOSIS — I10 Essential (primary) hypertension: Secondary | ICD-10-CM | POA: Diagnosis not present

## 2016-10-22 DIAGNOSIS — R262 Difficulty in walking, not elsewhere classified: Secondary | ICD-10-CM | POA: Diagnosis not present

## 2016-10-27 DIAGNOSIS — R262 Difficulty in walking, not elsewhere classified: Secondary | ICD-10-CM | POA: Diagnosis not present

## 2016-10-27 DIAGNOSIS — S96812D Strain of other specified muscles and tendons at ankle and foot level, left foot, subsequent encounter: Secondary | ICD-10-CM | POA: Diagnosis not present

## 2016-10-27 DIAGNOSIS — J449 Chronic obstructive pulmonary disease, unspecified: Secondary | ICD-10-CM | POA: Diagnosis not present

## 2016-10-27 DIAGNOSIS — I10 Essential (primary) hypertension: Secondary | ICD-10-CM | POA: Diagnosis not present

## 2016-10-27 DIAGNOSIS — M722 Plantar fascial fibromatosis: Secondary | ICD-10-CM | POA: Diagnosis not present

## 2016-10-27 DIAGNOSIS — S96811D Strain of other specified muscles and tendons at ankle and foot level, right foot, subsequent encounter: Secondary | ICD-10-CM | POA: Diagnosis not present

## 2016-10-29 DIAGNOSIS — S96812D Strain of other specified muscles and tendons at ankle and foot level, left foot, subsequent encounter: Secondary | ICD-10-CM | POA: Diagnosis not present

## 2016-10-29 DIAGNOSIS — R262 Difficulty in walking, not elsewhere classified: Secondary | ICD-10-CM | POA: Diagnosis not present

## 2016-10-29 DIAGNOSIS — S96811D Strain of other specified muscles and tendons at ankle and foot level, right foot, subsequent encounter: Secondary | ICD-10-CM | POA: Diagnosis not present

## 2016-10-29 DIAGNOSIS — I10 Essential (primary) hypertension: Secondary | ICD-10-CM | POA: Diagnosis not present

## 2016-10-29 DIAGNOSIS — M722 Plantar fascial fibromatosis: Secondary | ICD-10-CM | POA: Diagnosis not present

## 2016-10-29 DIAGNOSIS — J449 Chronic obstructive pulmonary disease, unspecified: Secondary | ICD-10-CM | POA: Diagnosis not present

## 2016-10-31 ENCOUNTER — Encounter: Payer: Self-pay | Admitting: Adult Health

## 2016-10-31 ENCOUNTER — Ambulatory Visit (INDEPENDENT_AMBULATORY_CARE_PROVIDER_SITE_OTHER): Payer: Medicare Other | Admitting: Adult Health

## 2016-10-31 VITALS — BP 120/60 | Temp 97.6°F | Ht 66.0 in | Wt 135.2 lb

## 2016-10-31 DIAGNOSIS — F3341 Major depressive disorder, recurrent, in partial remission: Secondary | ICD-10-CM | POA: Diagnosis not present

## 2016-10-31 DIAGNOSIS — R634 Abnormal weight loss: Secondary | ICD-10-CM | POA: Diagnosis not present

## 2016-10-31 NOTE — Progress Notes (Signed)
Subjective:    Patient ID: Peter Wolfe, male    DOB: [birth year], 83 y.o.   MRN: [id number]  HPI  83 year old male who  has a past medical history of BPH (benign prostatic hyperplasia); Broken rib; COPD (chronic obstructive pulmonary disease) (HCC); GERD (gastroesophageal reflux disease); GERD (gastroesophageal reflux disease); Hyperlipidemia; Hypertension; Insomnia; and Panic attack.   He presents to the office today for 4 week follow up regarding depression, weakness, and loss of weight.   During the last visit Remeron was increased from 30 mg to 45 mg.   Today in the office he reports that he continues to work with home PT/OT and is finding this to be helpful. He is now walking with two canes- during the last visit he was using a walker. He feels as though he is making progress. He will continue with PT/OT for another one month.   He reports that his appetite is good and he is eating. He has gained 3 pounds over the last month.   As for his depression, he feels as though this has improved. Per patient " I know I am getting better, I don't even think about it anymore."    Wt Readings from Last 3 Encounters:  10/31/16 135 lb 3.2 oz (61.3 kg)  09/26/16 132 lb 3.2 oz (60 kg)  08/29/16 133 lb 6.4 oz (60.5 kg)    Review of Systems  Constitutional: Positive for activity change.  Respiratory: Negative.   Cardiovascular: Negative.   Gastrointestinal: Positive for constipation.  Musculoskeletal: Positive for arthralgias and gait problem.  Skin: Negative.   All other systems reviewed and are negative.  Past Medical History:  Diagnosis Date  . BPH (benign prostatic hyperplasia)   . Broken rib   . COPD (chronic obstructive pulmonary disease) ([place])   . GERD (gastroesophageal reflux disease)   . GERD (gastroesophageal reflux disease)   . Hyperlipidemia   . Hypertension   . Insomnia   . Panic attack     Social History   Social History  . Marital status: Single    Spouse  name: N/A  . Number of children: N/A  . Years of education: N/A   Occupational History  . Not on file.   Social History Main Topics  . Smoking status: Never Smoker  . Smokeless tobacco: Never Used  . Alcohol use 0.0 oz/week     Comment: Wine with meals   . Drug use: No  . Sexual activity: Not on file   Other Topics Concern  . Not on file   Social History Narrative   Retired - Sgt, [employer]    Not married              Past Surgical History:  Procedure Laterality Date  . CHOLECYSTECTOMY      Family History  Problem Relation Age of Onset  . Emphysema Father   . Alcohol abuse Father   . Diabetes Sister     Allergies  Allergen Reactions  . Influenza Virus Vacc Split Pf     REACTION: allergic to eggs  . Doxycycline Rash  . Eggs Or Egg-Derived Products Rash    Current Outpatient Prescriptions on File Prior to Visit  Medication Sig Dispense Refill  . albuterol (PROVENTIL HFA;VENTOLIN HFA) 108 (90 Base) MCG/ACT inhaler Inhale 2 puffs into the lungs every 6 (six) hours as needed. 1 Inhaler 0  . busPIRone (BUSPAR) 30 MG tablet TAKE 1 TABLET DAILY (Patient taking differently: TAKE 30mg  by mouth  once DAILY) 100 tablet 3  . cloNIDine (CATAPRES) 0.1 MG tablet TAKE 1 TABLET DAILY (Patient taking differently: TAKE 0.1mg  by mouth once DAILY) 100 tablet 3  . FLUoxetine (PROZAC) 40 MG capsule Take 1 capsule (40 mg total) by mouth daily. 90 capsule 1  . mirtazapine (REMERON) 45 MG tablet Take 1 tablet (45 mg total) by mouth at bedtime. 30 tablet 3  . Multiple Vitamin (MULTIVITAMIN) capsule Take 1 capsule by mouth daily.    . naproxen (NAPROSYN) 500 MG tablet Take 1 tablet (500 mg total) by mouth 2 (two) times daily. 30 tablet 0  . omeprazole (PRILOSEC) 20 MG capsule TAKE 1 CAPSULE DAILY (Patient taking differently: TAKE 20mg  CAPSULE by mouth once DAILY) 100 capsule 3  . simvastatin (ZOCOR) 10 MG tablet TAKE 1 TABLET AT BEDTIME (Patient taking differently: TAKE 10mg  by mouth once  daily) 100 tablet 3  . tamsulosin (FLOMAX) 0.4 MG CAPS capsule TAKE 1 CAPSULE DAILY (Patient taking differently: TAKE 0.4mg  CAPSULE by mouth once DAILY) 100 capsule 3  . traZODone (DESYREL) 100 MG tablet TAKE 1 TABLET AT BEDTIME 100 tablet 3  . triamterene-hydrochlorothiazide (MAXZIDE) 75-50 MG tablet TAKE 1 TABLET DAILY 100 tablet 2   No current facility-administered medications on file prior to visit.     BP 120/60   Temp 97.6 F (36.4 C) (Oral)   Ht 5\' 6"  (1.676 m)   Wt 135 lb 3.2 oz (61.3 kg)   BMI 21.82 kg/m       Objective:   Physical Exam  Constitutional: He is oriented to person, place, and time. He appears well-developed and well-nourished. No distress.  Cardiovascular: Normal rate, regular rhythm, normal heart sounds and intact distal pulses.  Exam reveals no gallop and no friction rub.   No murmur heard. Pulmonary/Chest: Effort normal and breath sounds normal. No respiratory distress. He has no wheezes. He has no rales. He exhibits no tenderness.  Musculoskeletal:  He is walking with two canes. He has a slow steady gaint  Neurological: He is alert and oriented to person, place, and time. He has normal reflexes.  Skin: Skin is warm and dry. No rash noted. He is not diaphoretic. No erythema. No pallor.  Psychiatric: He has a normal mood and affect. His behavior is normal. Judgment and thought content normal.  Vitals reviewed.     Assessment & Plan:  1. Recurrent major depressive disorder, in partial remission ([place]) - Seems to be improving. Continue with current medications  - Follow up in 3 months   2. Weight loss - Gained 3 pounds  - Continue with remeron  - He is making good progress- slow, but good.  - Encouraged high protein diet.  - Follow up in three months   [name], NP

## 2016-11-03 DIAGNOSIS — R262 Difficulty in walking, not elsewhere classified: Secondary | ICD-10-CM | POA: Diagnosis not present

## 2016-11-03 DIAGNOSIS — S96811D Strain of other specified muscles and tendons at ankle and foot level, right foot, subsequent encounter: Secondary | ICD-10-CM | POA: Diagnosis not present

## 2016-11-03 DIAGNOSIS — J449 Chronic obstructive pulmonary disease, unspecified: Secondary | ICD-10-CM | POA: Diagnosis not present

## 2016-11-03 DIAGNOSIS — I10 Essential (primary) hypertension: Secondary | ICD-10-CM | POA: Diagnosis not present

## 2016-11-03 DIAGNOSIS — M722 Plantar fascial fibromatosis: Secondary | ICD-10-CM | POA: Diagnosis not present

## 2016-11-03 DIAGNOSIS — S96812D Strain of other specified muscles and tendons at ankle and foot level, left foot, subsequent encounter: Secondary | ICD-10-CM | POA: Diagnosis not present

## 2016-11-05 DIAGNOSIS — S96812D Strain of other specified muscles and tendons at ankle and foot level, left foot, subsequent encounter: Secondary | ICD-10-CM | POA: Diagnosis not present

## 2016-11-05 DIAGNOSIS — M722 Plantar fascial fibromatosis: Secondary | ICD-10-CM | POA: Diagnosis not present

## 2016-11-05 DIAGNOSIS — R262 Difficulty in walking, not elsewhere classified: Secondary | ICD-10-CM | POA: Diagnosis not present

## 2016-11-05 DIAGNOSIS — S96811D Strain of other specified muscles and tendons at ankle and foot level, right foot, subsequent encounter: Secondary | ICD-10-CM | POA: Diagnosis not present

## 2016-11-05 DIAGNOSIS — J449 Chronic obstructive pulmonary disease, unspecified: Secondary | ICD-10-CM | POA: Diagnosis not present

## 2016-11-05 DIAGNOSIS — I10 Essential (primary) hypertension: Secondary | ICD-10-CM | POA: Diagnosis not present

## 2016-11-10 DIAGNOSIS — S96811D Strain of other specified muscles and tendons at ankle and foot level, right foot, subsequent encounter: Secondary | ICD-10-CM | POA: Diagnosis not present

## 2016-11-10 DIAGNOSIS — S96812D Strain of other specified muscles and tendons at ankle and foot level, left foot, subsequent encounter: Secondary | ICD-10-CM | POA: Diagnosis not present

## 2016-11-10 DIAGNOSIS — M722 Plantar fascial fibromatosis: Secondary | ICD-10-CM | POA: Diagnosis not present

## 2016-11-10 DIAGNOSIS — R262 Difficulty in walking, not elsewhere classified: Secondary | ICD-10-CM | POA: Diagnosis not present

## 2016-11-10 DIAGNOSIS — J449 Chronic obstructive pulmonary disease, unspecified: Secondary | ICD-10-CM | POA: Diagnosis not present

## 2016-11-10 DIAGNOSIS — I10 Essential (primary) hypertension: Secondary | ICD-10-CM | POA: Diagnosis not present

## 2016-11-12 DIAGNOSIS — J449 Chronic obstructive pulmonary disease, unspecified: Secondary | ICD-10-CM | POA: Diagnosis not present

## 2016-11-12 DIAGNOSIS — S96812D Strain of other specified muscles and tendons at ankle and foot level, left foot, subsequent encounter: Secondary | ICD-10-CM | POA: Diagnosis not present

## 2016-11-12 DIAGNOSIS — I10 Essential (primary) hypertension: Secondary | ICD-10-CM | POA: Diagnosis not present

## 2016-11-12 DIAGNOSIS — M722 Plantar fascial fibromatosis: Secondary | ICD-10-CM | POA: Diagnosis not present

## 2016-11-12 DIAGNOSIS — S96811D Strain of other specified muscles and tendons at ankle and foot level, right foot, subsequent encounter: Secondary | ICD-10-CM | POA: Diagnosis not present

## 2016-11-12 DIAGNOSIS — R262 Difficulty in walking, not elsewhere classified: Secondary | ICD-10-CM | POA: Diagnosis not present

## 2016-11-17 DIAGNOSIS — M722 Plantar fascial fibromatosis: Secondary | ICD-10-CM | POA: Diagnosis not present

## 2016-11-17 DIAGNOSIS — R262 Difficulty in walking, not elsewhere classified: Secondary | ICD-10-CM | POA: Diagnosis not present

## 2016-11-17 DIAGNOSIS — S96812D Strain of other specified muscles and tendons at ankle and foot level, left foot, subsequent encounter: Secondary | ICD-10-CM | POA: Diagnosis not present

## 2016-11-17 DIAGNOSIS — J449 Chronic obstructive pulmonary disease, unspecified: Secondary | ICD-10-CM | POA: Diagnosis not present

## 2016-11-17 DIAGNOSIS — I10 Essential (primary) hypertension: Secondary | ICD-10-CM | POA: Diagnosis not present

## 2016-11-17 DIAGNOSIS — S96811D Strain of other specified muscles and tendons at ankle and foot level, right foot, subsequent encounter: Secondary | ICD-10-CM | POA: Diagnosis not present

## 2016-11-19 DIAGNOSIS — S96812D Strain of other specified muscles and tendons at ankle and foot level, left foot, subsequent encounter: Secondary | ICD-10-CM | POA: Diagnosis not present

## 2016-11-19 DIAGNOSIS — I10 Essential (primary) hypertension: Secondary | ICD-10-CM | POA: Diagnosis not present

## 2016-11-19 DIAGNOSIS — J449 Chronic obstructive pulmonary disease, unspecified: Secondary | ICD-10-CM | POA: Diagnosis not present

## 2016-11-19 DIAGNOSIS — M722 Plantar fascial fibromatosis: Secondary | ICD-10-CM | POA: Diagnosis not present

## 2016-11-19 DIAGNOSIS — S96811D Strain of other specified muscles and tendons at ankle and foot level, right foot, subsequent encounter: Secondary | ICD-10-CM | POA: Diagnosis not present

## 2016-11-19 DIAGNOSIS — R262 Difficulty in walking, not elsewhere classified: Secondary | ICD-10-CM | POA: Diagnosis not present

## 2016-12-28 ENCOUNTER — Encounter (HOSPITAL_COMMUNITY): Payer: Self-pay

## 2016-12-28 ENCOUNTER — Inpatient Hospital Stay (HOSPITAL_COMMUNITY)
Admission: EM | Admit: 2016-12-28 | Discharge: 2016-12-29 | DRG: 552 | Disposition: A | Discharge status: Skilled Nursing Facility | Payer: Medicare Other | Attending: Internal Medicine | Admitting: Internal Medicine

## 2016-12-28 ENCOUNTER — Emergency Department (HOSPITAL_COMMUNITY): Payer: Medicare Other

## 2016-12-28 DIAGNOSIS — S12100A Unspecified displaced fracture of second cervical vertebra, initial encounter for closed fracture: Secondary | ICD-10-CM | POA: Diagnosis not present

## 2016-12-28 DIAGNOSIS — S22009D Unspecified fracture of unspecified thoracic vertebra, subsequent encounter for fracture with routine healing: Secondary | ICD-10-CM

## 2016-12-28 DIAGNOSIS — W010XXA Fall on same level from slipping, tripping and stumbling without subsequent striking against object, initial encounter: Secondary | ICD-10-CM | POA: Diagnosis present

## 2016-12-28 DIAGNOSIS — N183 Chronic kidney disease, stage 3 unspecified: Secondary | ICD-10-CM

## 2016-12-28 DIAGNOSIS — S199XXA Unspecified injury of neck, initial encounter: Secondary | ICD-10-CM | POA: Diagnosis not present

## 2016-12-28 DIAGNOSIS — E860 Dehydration: Secondary | ICD-10-CM | POA: Diagnosis not present

## 2016-12-28 DIAGNOSIS — E7800 Pure hypercholesterolemia, unspecified: Secondary | ICD-10-CM | POA: Diagnosis not present

## 2016-12-28 DIAGNOSIS — M549 Dorsalgia, unspecified: Secondary | ICD-10-CM

## 2016-12-28 DIAGNOSIS — S3690XA Unspecified injury of unspecified intra-abdominal organ, initial encounter: Secondary | ICD-10-CM | POA: Diagnosis not present

## 2016-12-28 DIAGNOSIS — F410 Panic disorder [episodic paroxysmal anxiety] without agoraphobia: Secondary | ICD-10-CM | POA: Diagnosis present

## 2016-12-28 DIAGNOSIS — Z833 Family history of diabetes mellitus: Secondary | ICD-10-CM

## 2016-12-28 DIAGNOSIS — S22072A Unstable burst fracture of T9-T10 vertebra, initial encounter for closed fracture: Secondary | ICD-10-CM | POA: Diagnosis not present

## 2016-12-28 DIAGNOSIS — S2241XA Multiple fractures of ribs, right side, initial encounter for closed fracture: Secondary | ICD-10-CM | POA: Diagnosis not present

## 2016-12-28 DIAGNOSIS — S22070A Wedge compression fracture of T9-T10 vertebra, initial encounter for closed fracture: Secondary | ICD-10-CM | POA: Diagnosis not present

## 2016-12-28 DIAGNOSIS — W19XXXA Unspecified fall, initial encounter: Secondary | ICD-10-CM | POA: Diagnosis not present

## 2016-12-28 DIAGNOSIS — S22071A Stable burst fracture of T9-T10 vertebra, initial encounter for closed fracture: Secondary | ICD-10-CM | POA: Diagnosis present

## 2016-12-28 DIAGNOSIS — N400 Enlarged prostate without lower urinary tract symptoms: Secondary | ICD-10-CM | POA: Diagnosis present

## 2016-12-28 DIAGNOSIS — I10 Essential (primary) hypertension: Secondary | ICD-10-CM | POA: Diagnosis present

## 2016-12-28 DIAGNOSIS — R10819 Abdominal tenderness, unspecified site: Secondary | ICD-10-CM | POA: Diagnosis not present

## 2016-12-28 DIAGNOSIS — J449 Chronic obstructive pulmonary disease, unspecified: Secondary | ICD-10-CM | POA: Diagnosis present

## 2016-12-28 DIAGNOSIS — N179 Acute kidney failure, unspecified: Secondary | ICD-10-CM | POA: Diagnosis not present

## 2016-12-28 DIAGNOSIS — E785 Hyperlipidemia, unspecified: Secondary | ICD-10-CM | POA: Diagnosis present

## 2016-12-28 DIAGNOSIS — Z811 Family history of alcohol abuse and dependence: Secondary | ICD-10-CM

## 2016-12-28 DIAGNOSIS — M542 Cervicalgia: Secondary | ICD-10-CM | POA: Diagnosis not present

## 2016-12-28 DIAGNOSIS — K219 Gastro-esophageal reflux disease without esophagitis: Secondary | ICD-10-CM | POA: Diagnosis not present

## 2016-12-28 DIAGNOSIS — M546 Pain in thoracic spine: Secondary | ICD-10-CM | POA: Diagnosis not present

## 2016-12-28 DIAGNOSIS — F32A Depression, unspecified: Secondary | ICD-10-CM | POA: Diagnosis present

## 2016-12-28 DIAGNOSIS — F329 Major depressive disorder, single episode, unspecified: Secondary | ICD-10-CM | POA: Diagnosis present

## 2016-12-28 DIAGNOSIS — Z8781 Personal history of (healed) traumatic fracture: Secondary | ICD-10-CM

## 2016-12-28 DIAGNOSIS — M47816 Spondylosis without myelopathy or radiculopathy, lumbar region: Secondary | ICD-10-CM | POA: Diagnosis not present

## 2016-12-28 MED ORDER — KETOROLAC TROMETHAMINE 15 MG/ML IJ SOLN
15.0000 mg | Freq: Once | INTRAMUSCULAR | Status: AC
  Administered 2016-12-28: 15 mg via INTRAMUSCULAR
  Filled 2016-12-28: qty 1

## 2016-12-28 NOTE — ED Notes (Signed)
No respiratory or acute distress noted able to speak in full sentences call light in reach informed pt of wait to see physician.

## 2016-12-28 NOTE — ED Provider Notes (Signed)
[facility] DEPT Provider Note   CSN: [id number] Arrival date & time: 12/28/16  2042  By signing my name below, I, Norris Cross, attest that this documentation has been prepared under the direction and in the presence of Jaysin Gayler, PA-C. Electronically Signed: Ruta Hinds., ED Scribe. 12/28/16. 11:30 PM.   History   Chief Complaint Chief Complaint  Patient presents with  . Fall    HPI  ELWIN TSOU is a 83 y.o. male with hx of plantar fascitis, torn achilles who presents to the Emergency Department for evaluation s/p fall x4 days. Pt states that he got out of the car and loss his balance falling on his R side. Pt notes that he currently walks with 2 canes due to his hx of plantar fascitis and achilles tear. Just finished rehab for the same.  Pt reports R sided rib pain that radiates to the L side, back pain. Pt has taken Aleve and Naproxen with no relief. States pain is getting worse every day.  He denies LOC, hip pain, knee pain, ankle pain. Pt denies hitting his head. No SOB. No abdominal pain.   The history is provided by the patient. No language interpreter was used.    Past Medical History:  Diagnosis Date  . BPH (benign prostatic hyperplasia)   . Broken rib   . COPD (chronic obstructive pulmonary disease) ([place])   . GERD (gastroesophageal reflux disease)   . GERD (gastroesophageal reflux disease)   . Hyperlipidemia   . Hypertension   . Insomnia   . Panic attack     Patient Active Problem List   Diagnosis Date Noted  . Depression 06/03/2016  . History of fractured rib 11/28/2015  . History of peripheral edema 11/28/2015  . Seborrheic keratosis, inflamed 08/13/2015  . Actinic keratosis 08/13/2015  . Fatigue 07/31/2015  . Essential hypertension, benign 07/31/2015  . URINARY FREQUENCY 08/16/2009  . URTICARIA 11/13/2007  . Hyperlipidemia 06/08/2007  . GERD 06/08/2007  . PANIC ATTACK 05/07/2007  . COPD 05/07/2007  . INSOMNIA 05/07/2007      Past Surgical History:  Procedure Laterality Date  . CHOLECYSTECTOMY         Home Medications    Prior to Admission medications   Medication Sig Start Date End Date Taking? Authorizing Provider  albuterol (PROVENTIL HFA;VENTOLIN HFA) 108 (90 Base) MCG/ACT inhaler Inhale 2 puffs into the lungs every 6 (six) hours as needed. 07/28/16  Yes Lucretia Kern, DO  busPIRone (BUSPAR) 30 MG tablet TAKE 1 TABLET DAILY Patient taking differently: TAKE 30mg  by mouth once DAILY 07/29/16  Yes Dorena Cookey, MD  cloNIDine (CATAPRES) 0.1 MG tablet TAKE 1 TABLET DAILY Patient taking differently: TAKE 0.1mg  by mouth once DAILY 07/29/16  Yes Dorena Cookey, MD  FLUoxetine (PROZAC) 40 MG capsule Take 1 capsule (40 mg total) by mouth daily. 08/29/16  Yes [name], NP  mirtazapine (REMERON) 45 MG tablet Take 1 tablet (45 mg total) by mouth at bedtime. 09/26/16  Yes [name], NP  Multiple Vitamin (MULTIVITAMIN) capsule Take 1 capsule by mouth daily.   Yes Historical Provider, MD  naproxen sodium (ANAPROX) 220 MG tablet Take 220 mg by mouth 2 (two) times daily with a meal.   Yes Historical Provider, MD  omeprazole (PRILOSEC) 20 MG capsule TAKE 1 CAPSULE DAILY Patient taking differently: TAKE 20mg  CAPSULE by mouth once DAILY 07/29/16  Yes Dorena Cookey, MD  simvastatin (ZOCOR) 10 MG tablet TAKE 1 TABLET AT BEDTIME Patient taking differently:  TAKE 10mg  by mouth once daily 07/29/16  Yes Dorena Cookey, MD  tamsulosin (FLOMAX) 0.4 MG CAPS capsule TAKE 1 CAPSULE DAILY Patient taking differently: TAKE 0.4mg  CAPSULE by mouth once DAILY 07/29/16  Yes Dorena Cookey, MD  triamterene-hydrochlorothiazide (MAXZIDE) 75-50 MG tablet TAKE 1 TABLET DAILY 08/15/16  Yes [name], NP  naproxen (NAPROSYN) 500 MG tablet Take 1 tablet (500 mg total) by mouth 2 (two) times daily. Patient not taking: Reported on 12/28/2016 08/05/16   Daryl F de Villier II, PA  traZODone (DESYREL) 100 MG tablet TAKE 1 TABLET AT  BEDTIME Patient not taking: Reported on 12/28/2016 07/29/16   Dorena Cookey, MD    Family History Family History  Problem Relation Age of Onset  . Emphysema Father   . Alcohol abuse Father   . Diabetes Sister     Social History Social History  Substance Use Topics  . Smoking status: Never Smoker  . Smokeless tobacco: Never Used  . Alcohol use 0.0 oz/week     Comment: Wine with meals      Allergies   Influenza virus vacc split pf; Doxycycline; and Eggs or egg-derived products   Review of Systems Review of Systems  Constitutional: Negative for fever.  Respiratory: Negative for chest tightness and shortness of breath.   Cardiovascular: Positive for chest pain.  Gastrointestinal: Negative for abdominal pain.  Musculoskeletal: Positive for arthralgias (R sided ribs) and back pain.  Skin: Negative for wound.  Neurological: Negative for syncope.  All other systems reviewed and are negative.    Physical Exam Updated Vital Signs BP 137/87 (BP Location: Right Arm)   Pulse 82   Temp 97.9 F (36.6 C) (Oral)   Resp 18   Ht 5\' 6"  (1.676 m)   Wt 140 lb (63.5 kg)   SpO2 96%   BMI 22.60 kg/m   Physical Exam  Constitutional: He is oriented to person, place, and time. He appears well-developed and well-nourished.  HENT:  Head: Normocephalic and atraumatic.  Eyes: Conjunctivae are normal.  Neck: Neck supple.  Cardiovascular: Normal rate and regular rhythm.   No murmur heard. Pulmonary/Chest: Effort normal and breath sounds normal. No respiratory distress.  Abdominal: Soft. There is no tenderness.  Musculoskeletal: He exhibits no edema.  ttp over midline thoracic spine. No deformity, bruising, swelling. No tenderness over cervical or lumbar spine. Full ROM of upper and lower extremities.   Neurological: He is alert and oriented to person, place, and time.  5/5 and equal lower extremity strength. 2+ and equal patellar reflexes bilaterally. Pt able to dorsiflex bilateral toes  and feet with good strength against resistance. Equal sensation bilaterally over thighs and lower legs.   Skin: Skin is warm and dry.  Psychiatric: He has a normal mood and affect.  Nursing note and vitals reviewed.    ED Treatments / Results   DIAGNOSTIC STUDIES: Oxygen Saturation is 96% on RA, adequate by my interpretation.   COORDINATION OF CARE: 11:33 PM-Discussed next steps with pt. Pt verbalized understanding and is agreeable with the plan.    Labs (all labs ordered are listed, but only abnormal results are displayed) Labs Reviewed - No data to display  EKG  EKG Interpretation None       Radiology Dg Ribs Unilateral W/chest Right  Result Date: 12/29/2016 CLINICAL DATA:  [place] on Wednesday getting out of car. History of right rib fractures. Complaining of mid to upper back pain and right-sided generalized rib pain. EXAM: RIGHT RIBS AND CHEST -  3+ VIEW COMPARISON:  None. FINDINGS: Moderate to large hiatal hernia. Aortic atherosclerosis with slight uncoiling. Emphysematous hyperinflation of the lungs without pneumonic consolidation, hemothorax or pneumothorax. Subacute to chronic appearing right fifth, anterior seventh and chronic appearing anterior tenth and eleventh rib fractures are noted. No acute displaced appearing fracture is visualized. Degenerative disc disease with spurring of the endplates at V7-8. IMPRESSION: 1. Subacute to chronic right fifth, anterior seventh, tenth and eleventh rib fractures. 2. Moderate to large hiatal hernia. 3. Aortic atherosclerosis. 4. Emphysematous hyperinflation of the lungs. 5. Degenerative disc disease L1-2 with spurring of the endplates. Electronically Signed   By: Ashley Royalty M.D.   On: 12/29/2016 00:15   Dg Thoracic Spine 2 View  Result Date: 12/29/2016 CLINICAL DATA:  Neck pain after fall EXAM: THORACIC SPINE 2 VIEWS COMPARISON:  Lateral chest radiograph from 08/05/2016 FINDINGS: New moderate 50% compression of the T9 vertebral body is  noted since prior exam. There appears be some 4 mm of retropulsion of the posterior superior corner of T9. Mild multilevel degenerative disc disease of the dorsal spine. Degenerative disc disease with vacuum disc phenomena noted at T12-L1 and L1-2. IMPRESSION: 50% anterior compression fracture the T9 vertebral body with 4 mm of retropulsion. Electronically Signed   By: Ashley Royalty M.D.   On: 12/29/2016 00:19   Ct Thoracic Spine Wo Contrast  Result Date: 12/29/2016 CLINICAL DATA:  T9 fracture EXAM: CT THORACIC AND LUMBAR SPINE WITHOUT CONTRAST TECHNIQUE: Multidetector CT imaging of the thoracic and lumbar spine was performed without contrast. Multiplanar CT image reconstructions were also generated. COMPARISON:  Same day radiographs of the thoracic spine from 12/28/2016. Lateral CXR 08/05/2016 FINDINGS: CT THORACIC SPINE FINDINGS Alignment: Maintained thoracic curvature. Vertebrae: Burst fracture with 50% height loss of the T9 vertebral body with 3 mm of retropulsion of the posterior cortex. Eccentric vertical striations noted of the T5 through T11 vertebral bodies consistent with osteopenia. Schmorl's nodes noted along the inferior endplate of T8, T9 and H88 Paraspinal and other soft tissues: Mild paraspinal soft tissue edema about the T9 fracture. No focal paraspinal hematoma. Findings would suggest a recent fracture. Moderate-sized hiatal hernia. Coronary arteriosclerosis and aortic atherosclerosis. Disc levels: Degenerative disc disease with disc space narrowing of the included lower cervical spine from C4-5, C5-6 and C6-7. No focal thoracic spine disc herniations. Schmorl's nodes along the inferior endplates of T7, T8, F02 and T11. CT LUMBAR SPINE FINDINGS Segmentation: 5 lumbar type vertebrae. Alignment: Dextroscoliosis of the lumbar spine with apex at L3. Vertebrae: No acute fracture. Paraspinal and other soft tissues: No paraspinal soft tissue hematoma or mass. No adenopathy. Disc levels: Schmorl's nodes  noted at T11 and T12. There is degenerative disc disease at all levels of the lumbar spine with associated facet arthropathy. Left lateral disc bulge at L2-3. There is grade 1 retrolisthesis of L2 on L3, L3 on L4 and L4 and L5. IMPRESSION: CT THORACIC SPINE IMPRESSION Burst type fracture with 50% height loss of the T9 vertebral body with 3 mm retropulsion causing mild bony central canal stenosis. CT LUMBAR SPINE IMPRESSION Dextroscoliosis with lumbar spondylosis. Retrolisthesis of L2 on L3, L3 on L4 and L4 on L5 grade 1. No acute lumbar spine fracture. Electronically Signed   By: Ashley Royalty M.D.   On: 12/29/2016 01:37   Ct Lumbar Spine Wo Contrast  Result Date: 12/29/2016 CLINICAL DATA:  T9 fracture EXAM: CT THORACIC AND LUMBAR SPINE WITHOUT CONTRAST TECHNIQUE: Multidetector CT imaging of the thoracic and lumbar spine was performed  without contrast. Multiplanar CT image reconstructions were also generated. COMPARISON:  Same day radiographs of the thoracic spine from 12/28/2016. Lateral CXR 08/05/2016 FINDINGS: CT THORACIC SPINE FINDINGS Alignment: Maintained thoracic curvature. Vertebrae: Burst fracture with 50% height loss of the T9 vertebral body with 3 mm of retropulsion of the posterior cortex. Eccentric vertical striations noted of the T5 through T11 vertebral bodies consistent with osteopenia. Schmorl's nodes noted along the inferior endplate of T8, T9 and S85 Paraspinal and other soft tissues: Mild paraspinal soft tissue edema about the T9 fracture. No focal paraspinal hematoma. Findings would suggest a recent fracture. Moderate-sized hiatal hernia. Coronary arteriosclerosis and aortic atherosclerosis. Disc levels: Degenerative disc disease with disc space narrowing of the included lower cervical spine from C4-5, C5-6 and C6-7. No focal thoracic spine disc herniations. Schmorl's nodes along the inferior endplates of T7, T8, I62 and T11. CT LUMBAR SPINE FINDINGS Segmentation: 5 lumbar type vertebrae.  Alignment: Dextroscoliosis of the lumbar spine with apex at L3. Vertebrae: No acute fracture. Paraspinal and other soft tissues: No paraspinal soft tissue hematoma or mass. No adenopathy. Disc levels: Schmorl's nodes noted at T11 and T12. There is degenerative disc disease at all levels of the lumbar spine with associated facet arthropathy. Left lateral disc bulge at L2-3. There is grade 1 retrolisthesis of L2 on L3, L3 on L4 and L4 and L5. IMPRESSION: CT THORACIC SPINE IMPRESSION Burst type fracture with 50% height loss of the T9 vertebral body with 3 mm retropulsion causing mild bony central canal stenosis. CT LUMBAR SPINE IMPRESSION Dextroscoliosis with lumbar spondylosis. Retrolisthesis of L2 on L3, L3 on L4 and L4 on L5 grade 1. No acute lumbar spine fracture. Electronically Signed   By: Ashley Royalty M.D.   On: 12/29/2016 01:37    Procedures Procedures (including critical care time)  Medications Ordered in ED Medications - No data to display   Initial Impression / Assessment and Plan / ED Course  I have reviewed the triage vital signs and the nursing notes.  Pertinent labs & imaging results that were available during my care of the patient were reviewed by me and considered in my medical decision making (see chart for details).    Pt in ED with mid back pain radiating into bilateral ribs. Fell onto right side 5 days ago. Will get thoracic and rib films  12:45 AM Xray showing T9 compression fracture, which correlates with pt's pain and exam. Discussed with Dr. Christy Gentles, who has seen pt as well, will get CT.   1:45 AM CT showing Burst type fracture of T9 with 50% height loss. Discussed this case with Dr. Race Pore PA, with neurosurgery, who advised that no acute treatment indicated, agreed with TLSO brace, admission if requires pain management and PT, otherwise outpatient follow-up in 4 weeks. Pending TLSO brace placement.  3:39 AM Pt getting his brace now. Will attempt to ambulate after.    4:05 AM Pt unable to ambulate or even get out of bed on his own. Will admit for pain management, PT, dehydration  Spoke with triad, will admit.   Vitals:   12/29/16 0300 12/29/16 0316 12/29/16 0407 12/29/16 0505  BP: (!) 143/74 138/80 (!) 154/86 (!) 142/76  Pulse: 69 84 96 85  Resp:  16 20 16   Temp:  97.7 F (36.5 C) 98.4 F (36.9 C)   TempSrc:  Oral Oral   SpO2: 98% 95% 98% 95%  Weight:      Height:  Final Clinical Impressions(s) / ED Diagnoses   Final diagnoses:  Fall  Traumatic compression fracture of T9 thoracic vertebra, closed, initial encounter ([place])  Dehydration    New Prescriptions New Prescriptions   No medications on file   I personally performed the services described in this documentation, which was scribed in my presence. The recorded information has been reviewed and is accurate.     Jeannett Senior, PA-C 12/29/16 [place], MD 12/29/16 [phone]

## 2016-12-28 NOTE — ED Notes (Signed)
Bed: WA17 Expected date:  Expected time:  Means of arrival:  Comments: 83 yo M  Fall

## 2016-12-28 NOTE — ED Notes (Signed)
No respiratory or acute distress noted alert and oriented x 3 call light in reach. 

## 2016-12-28 NOTE — ED Triage Notes (Signed)
Fell on Wednesday while walking in dirt per pt right rib pain lower back pain no deformity noted states until today ibuprofen was working for his pain, no LOC with fall. Uses 2 canes to walk at home.

## 2016-12-29 ENCOUNTER — Emergency Department (HOSPITAL_COMMUNITY): Payer: Medicare Other

## 2016-12-29 DIAGNOSIS — N183 Chronic kidney disease, stage 3 unspecified: Secondary | ICD-10-CM

## 2016-12-29 DIAGNOSIS — S22009D Unspecified fracture of unspecified thoracic vertebra, subsequent encounter for fracture with routine healing: Secondary | ICD-10-CM

## 2016-12-29 DIAGNOSIS — S22072A Unstable burst fracture of T9-T10 vertebra, initial encounter for closed fracture: Secondary | ICD-10-CM

## 2016-12-29 DIAGNOSIS — N179 Acute kidney failure, unspecified: Secondary | ICD-10-CM

## 2016-12-29 DIAGNOSIS — E860 Dehydration: Secondary | ICD-10-CM

## 2016-12-29 DIAGNOSIS — E7800 Pure hypercholesterolemia, unspecified: Secondary | ICD-10-CM

## 2016-12-29 DIAGNOSIS — K219 Gastro-esophageal reflux disease without esophagitis: Secondary | ICD-10-CM

## 2016-12-29 DIAGNOSIS — W19XXXA Unspecified fall, initial encounter: Secondary | ICD-10-CM

## 2016-12-29 DIAGNOSIS — S22070A Wedge compression fracture of T9-T10 vertebra, initial encounter for closed fracture: Secondary | ICD-10-CM

## 2016-12-29 DIAGNOSIS — M549 Dorsalgia, unspecified: Secondary | ICD-10-CM

## 2016-12-29 LAB — COMPREHENSIVE METABOLIC PANEL
ALK PHOS: 77 U/L (ref 38–126)
ALT: 17 U/L (ref 17–63)
AST: 19 U/L (ref 15–41)
Albumin: 3.3 g/dL — ABNORMAL LOW (ref 3.5–5.0)
Anion gap: 8 (ref 5–15)
BUN: 29 mg/dL — AB (ref 6–20)
CALCIUM: 9.3 mg/dL (ref 8.9–10.3)
CO2: 24 mmol/L (ref 22–32)
Chloride: 98 mmol/L — ABNORMAL LOW (ref 101–111)
Creatinine, Ser: 1.86 mg/dL — ABNORMAL HIGH (ref 0.61–1.24)
GFR, EST AFRICAN AMERICAN: 36 mL/min — AB (ref >60)
GFR, EST NON AFRICAN AMERICAN: 31 mL/min — AB (ref >60)
Glucose, Bld: 104 mg/dL — ABNORMAL HIGH (ref 65–99)
Potassium: 3.6 mmol/L (ref 3.5–5.1)
Sodium: 130 mmol/L — ABNORMAL LOW (ref 135–145)
Total Bilirubin: 1.2 mg/dL (ref 0.3–1.2)
Total Protein: 9.6 g/dL — ABNORMAL HIGH (ref 6.5–8.1)

## 2016-12-29 LAB — CBC WITH DIFFERENTIAL/PLATELET
Basophils Absolute: 0 10*3/uL (ref 0.0–0.1)
Basophils Relative: 0 %
EOS PCT: 8 %
Eosinophils Absolute: 0.6 10*3/uL (ref 0.0–0.7)
HCT: 29.7 % — ABNORMAL LOW (ref 39.0–52.0)
Hemoglobin: 10.5 g/dL — ABNORMAL LOW (ref 13.0–17.0)
LYMPHS ABS: 1.5 10*3/uL (ref 0.7–4.0)
Lymphocytes Relative: 19 %
MCH: 34.3 pg — AB (ref 26.0–34.0)
MCHC: 35.4 g/dL (ref 30.0–36.0)
MCV: 97.1 fL (ref 78.0–100.0)
MONO ABS: 0.8 10*3/uL (ref 0.1–1.0)
MONOS PCT: 10 %
Neutro Abs: 5.1 10*3/uL (ref 1.7–7.7)
Neutrophils Relative %: 63 %
PLATELETS: 126 10*3/uL — AB (ref 150–400)
RBC: 3.06 MIL/uL — AB (ref 4.22–5.81)
RDW: 14.1 % (ref 11.5–15.5)
WBC: 8.1 10*3/uL (ref 4.0–10.5)

## 2016-12-29 LAB — CBC
HCT: 28 % — ABNORMAL LOW (ref 39.0–52.0)
Hemoglobin: 10 g/dL — ABNORMAL LOW (ref 13.0–17.0)
MCH: 33.9 pg (ref 26.0–34.0)
MCHC: 35.7 g/dL (ref 30.0–36.0)
MCV: 94.9 fL (ref 78.0–100.0)
PLATELETS: 121 10*3/uL — AB (ref 150–400)
RBC: 2.95 MIL/uL — AB (ref 4.22–5.81)
RDW: 14 % (ref 11.5–15.5)
WBC: 8.1 10*3/uL (ref 4.0–10.5)

## 2016-12-29 LAB — URINALYSIS, ROUTINE W REFLEX MICROSCOPIC
Bacteria, UA: NONE SEEN
Bilirubin Urine: NEGATIVE
GLUCOSE, UA: NEGATIVE mg/dL
KETONES UR: 5 mg/dL — AB
Nitrite: NEGATIVE
PROTEIN: NEGATIVE mg/dL
Specific Gravity, Urine: 1.019 (ref 1.005–1.030)
pH: 5 (ref 5.0–8.0)

## 2016-12-29 LAB — BASIC METABOLIC PANEL
Anion gap: 11 (ref 5–15)
BUN: 29 mg/dL — AB (ref 4–21)
BUN: 30 mg/dL — AB (ref 4–21)
BUN: 30 mg/dL — AB (ref 6–20)
CO2: 22 mmol/L (ref 22–32)
CREATININE: 1.9 mg/dL — AB (ref 0.6–1.3)
Calcium: 9.4 mg/dL (ref 8.9–10.3)
Chloride: 98 mmol/L — ABNORMAL LOW (ref 101–111)
Creatinine, Ser: 1.9 mg/dL — ABNORMAL HIGH (ref 0.61–1.24)
Creatinine: 1.9 mg/dL — AB (ref 0.6–1.3)
GFR calc Af Amer: 35 mL/min — ABNORMAL LOW (ref >60)
GFR calc non Af Amer: 31 mL/min — ABNORMAL LOW (ref >60)
GLUCOSE: 104 mg/dL
GLUCOSE: 106 mg/dL — AB (ref 65–99)
Glucose: 106 mg/dL
POTASSIUM: 3.6 mmol/L (ref 3.4–5.3)
POTASSIUM: 3.6 mmol/L (ref 3.5–5.1)
Potassium: 3.6 mmol/L (ref 3.4–5.3)
SODIUM: 130 mmol/L — AB (ref 137–147)
SODIUM: 131 mmol/L — AB (ref 137–147)
Sodium: 131 mmol/L — ABNORMAL LOW (ref 135–145)

## 2016-12-29 LAB — CBC AND DIFFERENTIAL
HCT: 28 % — AB (ref 41–53)
HEMATOCRIT: 30 % — AB (ref 41–53)
HEMOGLOBIN: 10 g/dL — AB (ref 13.5–17.5)
Hemoglobin: 10.5 g/dL — AB (ref 13.5–17.5)
PLATELETS: 126 10*3/uL — AB (ref 150–399)
Platelets: 121 10*3/uL — AB (ref 150–399)
WBC: 8.1 10^3/mL
WBC: 8.1 10*3/mL

## 2016-12-29 LAB — TSH
TSH: 3.469 u[IU]/mL (ref 0.350–4.500)
TSH: 3.47 u[IU]/mL (ref 0.41–5.90)

## 2016-12-29 LAB — HEPATIC FUNCTION PANEL: Bilirubin, Total: 1.2 mg/dL

## 2016-12-29 LAB — CK: CK TOTAL: 46 U/L — AB (ref 49–397)

## 2016-12-29 MED ORDER — ADULT MULTIVITAMIN W/MINERALS CH
1.0000 | ORAL_TABLET | Freq: Every day | ORAL | Status: DC
  Administered 2016-12-29: 1 via ORAL
  Filled 2016-12-29: qty 1

## 2016-12-29 MED ORDER — PANTOPRAZOLE SODIUM 40 MG PO TBEC
40.0000 mg | DELAYED_RELEASE_TABLET | Freq: Every day | ORAL | Status: DC
  Administered 2016-12-29: 40 mg via ORAL
  Filled 2016-12-29: qty 1

## 2016-12-29 MED ORDER — FLUOXETINE HCL 20 MG PO CAPS
40.0000 mg | ORAL_CAPSULE | Freq: Every day | ORAL | Status: DC
  Administered 2016-12-29: 11:00:00 40 mg via ORAL
  Filled 2016-12-29: qty 2

## 2016-12-29 MED ORDER — ACETAMINOPHEN 650 MG RE SUPP: 650.0000 mg | Freq: Four times a day (QID) | RECTAL | Status: DC | PRN

## 2016-12-29 MED ORDER — TAMSULOSIN HCL 0.4 MG PO CAPS
0.4000 mg | ORAL_CAPSULE | Freq: Every day | ORAL | Status: DC
  Administered 2016-12-29: 11:00:00 0.4 mg via ORAL
  Filled 2016-12-29: qty 1

## 2016-12-29 MED ORDER — MIRTAZAPINE 15 MG PO TABS: 45.0000 mg | ORAL_TABLET | Freq: Every day | ORAL | Status: DC

## 2016-12-29 MED ORDER — SIMVASTATIN 20 MG PO TABS: 10.0000 mg | ORAL_TABLET | Freq: Every day | ORAL | Status: DC

## 2016-12-29 MED ORDER — BISACODYL 5 MG PO TBEC
5.0000 mg | DELAYED_RELEASE_TABLET | Freq: Every day | ORAL | Status: DC | PRN
  Filled 2016-12-29: qty 1

## 2016-12-29 MED ORDER — HYDROCODONE-ACETAMINOPHEN 5-325 MG PO TABS
1.0000 | ORAL_TABLET | ORAL | Status: DC | PRN
  Administered 2016-12-29: 2 via ORAL
  Filled 2016-12-29: qty 2

## 2016-12-29 MED ORDER — DOCUSATE SODIUM 100 MG PO CAPS: 100.0000 mg | ORAL_CAPSULE | Freq: Two times a day (BID) | ORAL | 0 refills | Status: DC | PRN

## 2016-12-29 MED ORDER — ALBUTEROL SULFATE HFA 108 (90 BASE) MCG/ACT IN AERS: 2.0000 | INHALATION_SPRAY | RESPIRATORY_TRACT | Status: DC | PRN

## 2016-12-29 MED ORDER — HYDROCODONE-ACETAMINOPHEN 5-325 MG PO TABS: 1.0000 | ORAL_TABLET | ORAL | 0 refills | Status: DC | PRN

## 2016-12-29 MED ORDER — SODIUM CHLORIDE 0.9 % IV SOLN
INTRAVENOUS | Status: DC
  Administered 2016-12-29: 06:00:00 via INTRAVENOUS

## 2016-12-29 MED ORDER — FENTANYL CITRATE (PF) 100 MCG/2ML IJ SOLN
25.0000 ug | Freq: Once | INTRAMUSCULAR | Status: AC
  Administered 2016-12-29: 25 ug via INTRAVENOUS
  Filled 2016-12-29: qty 2

## 2016-12-29 MED ORDER — HYDROCODONE-ACETAMINOPHEN 5-325 MG PO TABS
1.0000 | ORAL_TABLET | Freq: Once | ORAL | Status: AC
  Administered 2016-12-29: 1 via ORAL
  Filled 2016-12-29: qty 1

## 2016-12-29 MED ORDER — SODIUM CHLORIDE 0.9 % IV BOLUS (SEPSIS)
500.0000 mL | Freq: Once | INTRAVENOUS | Status: AC
  Administered 2016-12-29: 500 mL via INTRAVENOUS

## 2016-12-29 MED ORDER — POLYETHYLENE GLYCOL 3350 17 G PO PACK: 17.0000 g | PACK | Freq: Every day | ORAL | 0 refills | Status: DC

## 2016-12-29 MED ORDER — CLONIDINE HCL 0.1 MG PO TABS
0.1000 mg | ORAL_TABLET | Freq: Every day | ORAL | Status: DC
  Administered 2016-12-29: 11:00:00 0.1 mg via ORAL
  Filled 2016-12-29: qty 1

## 2016-12-29 MED ORDER — NAPROXEN SODIUM 220 MG PO TABS: 220.0000 mg | ORAL_TABLET | Freq: Two times a day (BID) | ORAL | Status: DC

## 2016-12-29 MED ORDER — ONDANSETRON HCL 4 MG PO TABS: 4.0000 mg | ORAL_TABLET | Freq: Four times a day (QID) | ORAL | Status: DC | PRN

## 2016-12-29 MED ORDER — NALOXONE HCL 0.4 MG/ML IJ SOLN: 0.4000 mg | INTRAMUSCULAR | Status: DC | PRN

## 2016-12-29 MED ORDER — BUSPIRONE HCL 10 MG PO TABS: 30.0000 mg | ORAL_TABLET | Freq: Every day | ORAL | Status: DC

## 2016-12-29 MED ORDER — ONDANSETRON HCL 4 MG/2ML IJ SOLN: 4.0000 mg | Freq: Four times a day (QID) | INTRAMUSCULAR | Status: DC | PRN

## 2016-12-29 MED ORDER — DOCUSATE SODIUM 100 MG PO CAPS: 100.0000 mg | ORAL_CAPSULE | Freq: Two times a day (BID) | ORAL | Status: DC | PRN

## 2016-12-29 MED ORDER — MORPHINE SULFATE (PF) 4 MG/ML IV SOLN
2.0000 mg | INTRAVENOUS | Status: DC | PRN
  Administered 2016-12-29: 2 mg via INTRAVENOUS
  Filled 2016-12-29: qty 1

## 2016-12-29 MED ORDER — HEPARIN SODIUM (PORCINE) 5000 UNIT/ML IJ SOLN
5000.0000 [IU] | Freq: Three times a day (TID) | INTRAMUSCULAR | Status: DC
  Administered 2016-12-29: 5000 [IU] via SUBCUTANEOUS
  Filled 2016-12-29: qty 1

## 2016-12-29 MED ORDER — MORPHINE SULFATE (PF) 2 MG/ML IV SOLN: 2.0000 mg | INTRAVENOUS | Status: DC | PRN

## 2016-12-29 MED ORDER — ALBUTEROL SULFATE (2.5 MG/3ML) 0.083% IN NEBU: 2.5000 mg | INHALATION_SOLUTION | RESPIRATORY_TRACT | Status: DC | PRN

## 2016-12-29 MED ORDER — ACETAMINOPHEN 325 MG PO TABS: 650.0000 mg | ORAL_TABLET | Freq: Four times a day (QID) | ORAL | Status: DC | PRN

## 2016-12-29 NOTE — Evaluation (Signed)
Physical Therapy Evaluation Patient Details Name: Peter Wolfe MRN: [id number] DOB: [birth year] Today's Date: 12/29/2016   History of Present Illness  83 yo male admitted after fall getting out of his car. Recently had HHPT for torn achilles.  CT of the spine was done which showed burst type fracture with 50% height loss of T9 vertebral body with 3 mm retropulsion.  Neurosurgery was consulted who recommended conservative management at this time with a TLSO.  Clinical Impression  The patient report that back pain is minimal after medication. He declines SNF at this time. He ambulated x 120' with minimal  assistance.  Pt admitted with above diagnosis. Pt currently with functional limitations due to the deficits listed below (see PT Problem List).  Pt will benefit from skilled PT to increase their independence and safety with mobility to allow discharge to the venue listed below.       Follow Up Recommendations Home health PT;Supervision - Intermittent (pt. declines SNF at this time)    Equipment Recommendations  None recommended by PT    Recommendations for Other Services OT consult     Precautions / Restrictions Precautions Precautions: Back Precaution Comments: reviewed log roll, no bending, that the brace may need to be donned in supine and that needs clarification. Required Braces or Orthoses: Spinal Brace Spinal Brace: Thoracolumbosacral orthotic (no orders for donning brace position. )      Mobility  Bed Mobility Overal bed mobility: Needs Assistance Bed Mobility: Rolling;Sidelying to Sit Rolling: Min guard Sidelying to sit: Min assist       General bed mobility comments: cues to flex knees and log roll then push uptto sitting. the patient required min assist with trunk. The TLSO was on the patient in bed.   Transfers Overall transfer level: Needs assistance Equipment used: Rolling walker (2 wheeled) Transfers: Sit to/from Stand Sit to Stand: Min assist          General transfer comment: cues for posture.  Ambulation/Gait Ambulation/Gait assistance: Min assist Ambulation Distance (Feet): 120 Feet Assistive device: Rolling walker (2 wheeled) Gait Pattern/deviations: Step-to pattern;Step-through pattern;Trunk flexed     General Gait Details: noted right knee is slightly flexed during swing and stance  Stairs            Wheelchair Mobility    Modified Rankin (Stroke Patients Only)       Balance Overall balance assessment: History of Falls;Needs assistance Sitting-balance support: No upper extremity supported;Feet supported Sitting balance-Leahy Scale: Good     Standing balance support: During functional activity;Bilateral upper extremity supported Standing balance-Leahy Scale: Fair                               Pertinent Vitals/Pain Pain Assessment: 0-10 Faces Pain Scale: Hurts a little bit Pain Location: reports tat the severe pain is subsided, some discomfort on the left side when rollinfg Pain Descriptors / Indicators: Cramping;Squeezing Pain Intervention(s): Premedicated before session;Monitored during session;Repositioned    Home Living Family/patient expects to be discharged to:: Private residence Living Arrangements: Alone Available Help at Discharge: Friend(s) Type of Home: Apartment Home Access: Level entry     Home Layout: One level Home Equipment: Environmental consultant - 2 wheels;Cane - single point;Tub bench      Prior Function Level of Independence: Needs assistance   Gait / Transfers Assistance Needed: mod. I with 2 canwes or RW. Drives  ADL's / Homemaking Assistance Needed: friend asist with groceries.  Comments: fr     Hand Dominance        Extremity/Trunk Assessment   Upper Extremity Assessment Upper Extremity Assessment: Defer to OT evaluation    Lower Extremity Assessment Lower Extremity Assessment: RLE deficits/detail;LLE deficits/detail RLE Deficits / Details: decreased andkle  dorsiflexion,        Communication      Cognition Arousal/Alertness: Awake/alert Behavior During Therapy: WFL for tasks assessed/performed Overall Cognitive Status: Within Functional Limits for tasks assessed                                        General Comments      Exercises     Assessment/Plan    PT Assessment Patient needs continued PT services  PT Problem List Decreased strength;Decreased activity tolerance;Decreased range of motion;Decreased balance;Decreased mobility;Decreased knowledge of precautions;Decreased safety awareness;Decreased knowledge of use of DME;Pain       PT Treatment Interventions DME instruction;Gait training;Functional mobility training;Therapeutic activities;Patient/family education    PT Goals (Current goals can be found in the Care Plan section)  Acute Rehab PT Goals Patient Stated Goal: to go home, get back church PT Goal Formulation: With patient Time For Goal Achievement: 01/05/17 Potential to Achieve Goals: Good    Frequency Min 5X/week   Barriers to discharge Decreased caregiver support      Co-evaluation               End of Session Equipment Utilized During Treatment: Gait belt;Back brace Activity Tolerance: Patient tolerated treatment well Patient left: in chair;with call bell/phone within reach;with chair alarm set;with family/visitor present Nurse Communication: Mobility status PT Visit Diagnosis: History of falling (Z91.81);Difficulty in walking, not elsewhere classified (R26.2)    Time: 0355-9741 PT Time Calculation (min) (ACUTE ONLY): 27 min   Charges:   PT Evaluation $PT Eval Low Complexity: 1 Procedure PT Treatments $Gait Training: 8-22 mins   PT G CodesTresa Endo PT [phone]  Claretha Cooper 12/29/2016, 10:33 AM

## 2016-12-29 NOTE — ED Notes (Signed)
No respiratory or acute distress noted alert and oriented x 3 call light in reach no reaction to medication noted moves all extremities.

## 2016-12-29 NOTE — Discharge Instructions (Signed)
DO NOT REMOVE THE BRACE UNLESS YOU HAVE SOMEONE TO HELP YOU TO WEAR IT BACK. °WEAR THE BRACE ONLY IN LYING DOWN POSITION BEFORE GETTING UP. °DO NOT WALK OR SIT WITHOUT THE BRACE. ° ° °Spinal Compression Fracture °A spinal compression fracture is a collapse of the bones that form the spine (vertebrae). With this type of fracture, the vertebrae become squashed (compressed) into a wedge shape. Most compression fractures happen in the middle or lower part of the spine. °What are the causes? °This condition may be caused by: °· Thinning and loss of density in the bones (osteoporosis). This is the most common cause. °· A fall. °· A car or motorcycle accident. °· Cancer. °· Trauma, such as a heavy, direct hit to the head. °What increases the risk? °You may be at greater risk for a spinal compression fracture if you: °· Are 50 years old or older. °· Have osteoporosis. °· Have certain types of cancer, including: °¨ Multiple myeloma. °¨ Lymphoma. °¨ Prostate cancer. °¨ Lung cancer. °¨ Breast cancer. °What are the signs or symptoms? °Symptoms of this condition include: °· Severe pain. °· Pain that gets worse over time. °· Pain that is worse when you stand, walk, sit, or bend. °· Sudden pain that is so bad that it is hard for you to move. °· Bending or humping of the spine. °· Gradual loss of height. °· Numbness, tingling, or weakness in the back and legs. °· Trouble walking. °Your symptoms will depend on the cause of the fracture and how quickly it develops. For example, fractures that are caused by osteoporosis can cause few symptoms, no symptoms, or symptoms that develop slowly over time. °How is this diagnosed? °This condition may be diagnosed based on symptoms, medical history, and a physical exam. During the physical exam, your health care provider may tap along the length of your spine to check for tenderness. Tests may be done to confirm the diagnosis. They may include: °· A bone density test to check for  osteoporosis. °· Imaging tests, such as a spine X-ray, a CT scan, or MRI. °How is this treated? °Treatment for this condition depends on the cause and severity of the condition. Some fractures, such as those that are caused by osteoporosis, may heal on their own with supportive care. This may include: °· Pain medicine. °· Rest. °· A back brace. °· Physical therapy exercises. °· Medicine that reduces bone pain. °· Calcium and vitamin D supplements. °Fractures that cause the back to become misshapen, cause nerve pain or weakness, or do not respond to other treatment may be treated with a surgical procedure, such as: °· Vertebroplasty. In this procedure, bone cement is injected into the collapsed vertebrae to stabilize them. °· Balloon kyphoplasty. In this procedure, the collapsed vertebrae are expanded with a balloon and then bone cement is injected into them. °· Spinal fusion. In this procedure, the collapsed vertebrae are connected (fused) to normal vertebrae. °Follow these instructions at home: °General instructions  °· Take medicines only as directed by your health care provider. °· Do not drive or operate heavy machinery while taking pain medicine. °· If directed, apply ice to the injured area: °¨ Put ice in a plastic bag. °¨ Place a towel between your skin and the bag. °¨ Leave the ice on for 30 minutes every two hours at first. Then apply the ice as needed. °· Wear your neck brace or back brace as directed by your health care provider. °· Do not drink alcohol. Alcohol   can interfere with your treatment.  Keep all follow-up visits as directed by your health care provider. This is important. It can help to prevent permanent injury, disability, and long-lasting (chronic) pain. Activity   Stay in bed (on bed rest) only as directed by your health care provider. Being on bed rest for too long can make your condition worse.  Return to your normal activities as directed by your health care provider. Ask what  activities are safe for you.  Do exercises to improve motion and strength in your back (physical therapy), as recommended by your health care provider.  Exercise regularly as directed by your health care provider. Contact a health care provider if:  You have a fever.  You develop a cough that makes your pain worse.  Your pain medicine is not helping.  Your pain does not get better over time.  You cannot return to your normal activities as planned or expected. Get help right away if:  Your pain is very bad and it suddenly gets worse.  You are unable to move any body part (paralysis) that is below the level of your injury.  You have numbness, tingling, or weakness in any body part that is below the level of your injury.  You cannot control your bladder or bowels. This information is not intended to replace advice given to you by your health care provider. Make sure you discuss any questions you have with your health care provider. Document Released: 09/15/2005 Document Revised: 05/13/2016 Document Reviewed: 09/19/2014 Elsevier Interactive Patient Education  2017 [employer].

## 2016-12-29 NOTE — Progress Notes (Signed)
CSW consulted for SNF placement. PN reviewed. Pt declines SNF at this time . RNCM will assist with d/c planning needs.  Werner Lean LCSW [phone]

## 2016-12-29 NOTE — Progress Notes (Signed)
[facility] PLAN OF CARE NOTE Patient: Peter Wolfe TKK:[id number]   PCP: [name], NP DOB: 08-28-1929   DOA: 12/28/2016   DOS: 12/29/2016    Patient was admitted by my colleague Dr. Reesa Chew earlier on 12/29/2016. I have reviewed the H&P as well as assessment and plan and agree with the same. Important changes in the plan are listed below.  Plan of care: Principal Problem:   Thoracic spine fracture [facility]) Active Problems:   Hyperlipidemia   GERD   Essential hypertension, benign   History of fractured rib   Depression   Fall   Back pain   AKI (acute kidney injury) ([place]) continue IV fluids. Continue pain management. Pt assigned to Dr Cyndy Freeze from neurosurgery, follow up as an outpatient. D/w Dr Janey Greaser today. Needs SNF, social worker consulted.   Author: Berle Mull, MD [facility] Pager: [phone] 12/29/2016 3:07 PM   If 7PM-7AM, please contact night-coverage at [email or web address], password [facility]

## 2016-12-29 NOTE — Care Management Note (Signed)
Case Management Note  Patient Details  Name: Peter Wolfe MRN: [id number] Date of Birth: 1928-12-31  Subjective/Objective: Provided w/ private duty care list as resourceout of pocket cost) since patient home alone-patient voiced understanding. No further CM needs.                   Action/Plan:d/c home w/HHC/ambulance transp   Expected Discharge Date:  12/31/16               Expected Discharge Plan:  [place]  In-House Referral:  Clinical Social Work  Discharge planning Services  CM Consult  Post Acute Care Choice:    Choice offered to:     DME Arranged:    DME Agency:     HH Arranged:  RN, PT, OT, Nurse's Aide, Social Work [employer] Agency:     Status of Service:  Completed, signed off  If discussed at [employer], dates discussed:    Additional Comments:  Dessa Phi, RN 12/29/2016, 4:32 PM

## 2016-12-29 NOTE — H&P (Signed)
History and Physical    Peter Wolfe:[id number] DOB: 1928/12/15 DOA: 12/28/2016  PCP: [name], NP Patient coming from: Home  Chief Complaint:  Back pain HPI: Peter Wolfe is a 83 y.o. male with medical history significant of hypertension, hyperlipidemia, GERD, BPH comes to the ED with the complaints of back pain. Patient states he experienced mechanical fall about 5 days ago on last Wednesday. Patient has been recovering from torn achilles for which he received 16 weeks of home PT and now uses 2 canes. So on last Wednesday when he tried to get out of his car he said he didn't feel right but did not have any previous syncopal or warning signs and he ended up falling to the ground. She laid on the ground and eventually saw a couple of gentlemen around his apartment complex and call for help to get him back as apartment. Patient lives alone at home and he remained bedbound for at least 6 hours after getting in the house. Over the course of next 2 or 3 days he stated his pain improved just a little bit and was able to do minimal activity but his pain returned and it progressively got worse to a point where he could not bear it. He describes his pain as intermittent thoracic back area and coming across torso from from both sides. No other complaints at this time  In the ER patient appeared in moderate distress. He was given pain medications and CT of the spine was done which showed burst type fracture with 50% height loss of T9 vertebral body with 3 mm retropulsion. His routine labs showed elevated creatinine of 1.9 up from his baseline of around 1.2. Neurosurgery was consulted who recommended conservative management at this time. Patient did receive several doses of pain medication with minimal relief.   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 Recently he received about 16 weeks of home  care/physical therapy for torn achilles which he completed about 4 weeks ago.   Review of Systems: As per HPI otherwise 10 point review of systems negative.   Past Medical History:  Diagnosis Date  . BPH (benign prostatic hyperplasia)   . Broken rib   . COPD (chronic obstructive pulmonary disease) ([place])   . GERD (gastroesophageal reflux disease)   . GERD (gastroesophageal reflux disease)   . Hyperlipidemia   . Hypertension   . Insomnia   . Panic attack     Past Surgical History:  Procedure Laterality Date  . CHOLECYSTECTOMY       reports that he has never smoked. He has never used smokeless tobacco. He reports that he drinks alcohol. He reports that he does not use drugs.  Allergies  Allergen Reactions  . Influenza Virus Vacc Split Pf     REACTION: allergic to eggs  . Doxycycline Rash  . Eggs Or Egg-Derived Products Rash    Family History  Problem Relation Age of Onset  . Emphysema Father   . Alcohol abuse Father   . Diabetes Sister     Acceptable: Family history reviewed and not pertinent (If you reviewed it)  Prior to Admission medications   Medication Sig Start Date End Date Taking? Authorizing Provider  albuterol (PROVENTIL HFA;VENTOLIN HFA) 108 (90 Base) MCG/ACT inhaler Inhale 2 puffs into the lungs every 6 (six) hours as needed. 07/28/16  Yes Lucretia Kern, DO  busPIRone (  BUSPAR) 30 MG tablet TAKE 1 TABLET DAILY Patient taking differently: TAKE 30mg  by mouth once DAILY 07/29/16  Yes Dorena Cookey, MD  cloNIDine (CATAPRES) 0.1 MG tablet TAKE 1 TABLET DAILY Patient taking differently: TAKE 0.1mg  by mouth once DAILY 07/29/16  Yes Dorena Cookey, MD  FLUoxetine (PROZAC) 40 MG capsule Take 1 capsule (40 mg total) by mouth daily. 08/29/16  Yes [name], NP  mirtazapine (REMERON) 45 MG tablet Take 1 tablet (45 mg total) by mouth at bedtime. 09/26/16  Yes [name], NP  Multiple Vitamin (MULTIVITAMIN) capsule Take 1 capsule by mouth daily.   Yes Historical  Provider, MD  naproxen sodium (ANAPROX) 220 MG tablet Take 220 mg by mouth 2 (two) times daily with a meal.   Yes Historical Provider, MD  omeprazole (PRILOSEC) 20 MG capsule TAKE 1 CAPSULE DAILY Patient taking differently: TAKE 20mg  CAPSULE by mouth once DAILY 07/29/16  Yes Dorena Cookey, MD  simvastatin (ZOCOR) 10 MG tablet TAKE 1 TABLET AT BEDTIME Patient taking differently: TAKE 10mg  by mouth once daily 07/29/16  Yes Dorena Cookey, MD  tamsulosin (FLOMAX) 0.4 MG CAPS capsule TAKE 1 CAPSULE DAILY Patient taking differently: TAKE 0.4mg  CAPSULE by mouth once DAILY 07/29/16  Yes Dorena Cookey, MD  triamterene-hydrochlorothiazide (MAXZIDE) 75-50 MG tablet TAKE 1 TABLET DAILY 08/15/16  Yes [name], NP  naproxen (NAPROSYN) 500 MG tablet Take 1 tablet (500 mg total) by mouth 2 (two) times daily. Patient not taking: Reported on 12/28/2016 08/05/16   Daryl F de Villier II, PA  traZODone (DESYREL) 100 MG tablet TAKE 1 TABLET AT BEDTIME Patient not taking: Reported on 12/28/2016 07/29/16   Dorena Cookey, MD    Physical Exam: Vitals:   12/29/16 0300 12/29/16 0316 12/29/16 0407 12/29/16 0505  BP: (!) 143/74 138/80 (!) 154/86 (!) 142/76  Pulse: 69 84 96 85  Resp:  16 20 16   Temp:  97.7 F (36.5 C) 98.4 F (36.9 C)   TempSrc:  Oral Oral   SpO2: 98% 95% 98% 95%  Weight:      Height:          Constitutional: Mild distress due to back pain Vitals:   12/29/16 0300 12/29/16 0316 12/29/16 0407 12/29/16 0505  BP: (!) 143/74 138/80 (!) 154/86 (!) 142/76  Pulse: 69 84 96 85  Resp:  16 20 16   Temp:  97.7 F (36.5 C) 98.4 F (36.9 C)   TempSrc:  Oral Oral   SpO2: 98% 95% 98% 95%  Weight:      Height:       Eyes: PERRL, lids and conjunctivae normal ENMT: Mucous membranes are moist. Posterior pharynx clear of any exudate or lesions.Normal dentition.  Neck: normal, supple, no masses, no thyromegaly Respiratory: clear to auscultation bilaterally, no wheezing, no crackles. Normal  respiratory effort. No accessory muscle use.  Cardiovascular: Regular rate and rhythm, no murmurs / rubs / gallops. No extremity edema. 2+ pedal pulses. No carotid bruits.  Abdomen: no tenderness, no masses palpated. No hepatosplenomegaly. Bowel sounds positive.  Musculoskeletal: no clubbing / cyanosis. No joint deformity upper and lower extremities. Good ROM, no contractures. Normal muscle tone.  Skin: no rashes, lesions, ulcers. No induration Neurologic: CN 2-12 grossly intact. Sensation intact, DTR normal. Strength 5/5 in all 4.  Psychiatric: Normal judgment and insight. Alert and oriented x 3. Normal mood.  Back brace in place   Labs on Admission: I have personally reviewed following labs and imaging studies  CBC:  Recent Labs Lab 12/29/16 0111  WBC 8.1  NEUTROABS 5.1  HGB 10.5*  HCT 29.7*  MCV 97.1  PLT 676*   Basic Metabolic Panel:  Recent Labs Lab 12/29/16 0111  NA 131*  K 3.6  CL 98*  CO2 22  GLUCOSE 106*  BUN 30*  CREATININE 1.90*  CALCIUM 9.4   GFR: Estimated Creatinine Clearance: 25.5 mL/min (A) (by C-G formula based on SCr of 1.9 mg/dL (H)). Liver Function Tests: No results for input(s): AST, ALT, ALKPHOS, BILITOT, PROT, ALBUMIN in the last 168 hours. No results for input(s): LIPASE, AMYLASE in the last 168 hours. No results for input(s): AMMONIA in the last 168 hours. Coagulation Profile: No results for input(s): INR, PROTIME in the last 168 hours. Cardiac Enzymes: No results for input(s): CKTOTAL, CKMB, CKMBINDEX, TROPONINI in the last 168 hours. BNP (last 3 results) No results for input(s): PROBNP in the last 8760 hours. HbA1C: No results for input(s): HGBA1C in the last 72 hours. CBG: No results for input(s): GLUCAP in the last 168 hours. Lipid Profile: No results for input(s): CHOL, HDL, LDLCALC, TRIG, CHOLHDL, LDLDIRECT in the last 72 hours. Thyroid Function Tests: No results for input(s): TSH, T4TOTAL, FREET4, T3FREE, THYROIDAB in the last  72 hours. Anemia Panel: No results for input(s): VITAMINB12, FOLATE, FERRITIN, TIBC, IRON, RETICCTPCT in the last 72 hours. Urine analysis:    Component Value Date/Time   COLORURINE yellow 06/13/2010 0000   APPEARANCEUR Clear 06/13/2010 0000   LABSPEC 1.020 06/13/2010 0000   PHURINE 7.5 06/13/2010 0000   HGBUR negative 06/13/2010 0000   BILIRUBINUR n 07/31/2015 1224   PROTEINUR n 07/31/2015 1224   UROBILINOGEN 1.0 07/31/2015 1224   UROBILINOGEN 2.0 06/13/2010 0000   NITRITE n 07/31/2015 1224   NITRITE negative 06/13/2010 0000   LEUKOCYTESUR moderate (2+) (A) 07/31/2015 1224   Sepsis Labs: !!!!!!!!!!!!!!!!!!!!!!!!!!!!!!!!!!!!!!!!!!!! @LABRCNTIP (procalcitonin:4,lacticidven:4) )No results found for this or any previous visit (from the past 240 hour(s)).   Radiological Exams on Admission: Dg Ribs Unilateral W/chest Right  Result Date: 12/29/2016 CLINICAL DATA:  [place] on Wednesday getting out of car. History of right rib fractures. Complaining of mid to upper back pain and right-sided generalized rib pain. EXAM: RIGHT RIBS AND CHEST - 3+ VIEW COMPARISON:  None. FINDINGS: Moderate to large hiatal hernia. Aortic atherosclerosis with slight uncoiling. Emphysematous hyperinflation of the lungs without pneumonic consolidation, hemothorax or pneumothorax. Subacute to chronic appearing right fifth, anterior seventh and chronic appearing anterior tenth and eleventh rib fractures are noted. No acute displaced appearing fracture is visualized. Degenerative disc disease with spurring of the endplates at P9-5. IMPRESSION: 1. Subacute to chronic right fifth, anterior seventh, tenth and eleventh rib fractures. 2. Moderate to large hiatal hernia. 3. Aortic atherosclerosis. 4. Emphysematous hyperinflation of the lungs. 5. Degenerative disc disease L1-2 with spurring of the endplates. Electronically Signed   By: Ashley Royalty M.D.   On: 12/29/2016 00:15   Dg Thoracic Spine 2 View  Result Date: 12/29/2016 CLINICAL  DATA:  Neck pain after fall EXAM: THORACIC SPINE 2 VIEWS COMPARISON:  Lateral chest radiograph from 08/05/2016 FINDINGS: New moderate 50% compression of the T9 vertebral body is noted since prior exam. There appears be some 4 mm of retropulsion of the posterior superior corner of T9. Mild multilevel degenerative disc disease of the dorsal spine. Degenerative disc disease with vacuum disc phenomena noted at T12-L1 and L1-2. IMPRESSION: 50% anterior compression fracture the T9 vertebral body with 4 mm of retropulsion. Electronically Signed   By: Shanon Brow  Randel Pigg M.D.   On: 12/29/2016 00:19   Ct Thoracic Spine Wo Contrast  Result Date: 12/29/2016 CLINICAL DATA:  T9 fracture EXAM: CT THORACIC AND LUMBAR SPINE WITHOUT CONTRAST TECHNIQUE: Multidetector CT imaging of the thoracic and lumbar spine was performed without contrast. Multiplanar CT image reconstructions were also generated. COMPARISON:  Same day radiographs of the thoracic spine from 12/28/2016. Lateral CXR 08/05/2016 FINDINGS: CT THORACIC SPINE FINDINGS Alignment: Maintained thoracic curvature. Vertebrae: Burst fracture with 50% height loss of the T9 vertebral body with 3 mm of retropulsion of the posterior cortex. Eccentric vertical striations noted of the T5 through T11 vertebral bodies consistent with osteopenia. Schmorl's nodes noted along the inferior endplate of T8, T9 and K02 Paraspinal and other soft tissues: Mild paraspinal soft tissue edema about the T9 fracture. No focal paraspinal hematoma. Findings would suggest a recent fracture. Moderate-sized hiatal hernia. Coronary arteriosclerosis and aortic atherosclerosis. Disc levels: Degenerative disc disease with disc space narrowing of the included lower cervical spine from C4-5, C5-6 and C6-7. No focal thoracic spine disc herniations. Schmorl's nodes along the inferior endplates of T7, T8, R42 and T11. CT LUMBAR SPINE FINDINGS Segmentation: 5 lumbar type vertebrae. Alignment: Dextroscoliosis of the lumbar  spine with apex at L3. Vertebrae: No acute fracture. Paraspinal and other soft tissues: No paraspinal soft tissue hematoma or mass. No adenopathy. Disc levels: Schmorl's nodes noted at T11 and T12. There is degenerative disc disease at all levels of the lumbar spine with associated facet arthropathy. Left lateral disc bulge at L2-3. There is grade 1 retrolisthesis of L2 on L3, L3 on L4 and L4 and L5. IMPRESSION: CT THORACIC SPINE IMPRESSION Burst type fracture with 50% height loss of the T9 vertebral body with 3 mm retropulsion causing mild bony central canal stenosis. CT LUMBAR SPINE IMPRESSION Dextroscoliosis with lumbar spondylosis. Retrolisthesis of L2 on L3, L3 on L4 and L4 on L5 grade 1. No acute lumbar spine fracture. Electronically Signed   By: Ashley Royalty M.D.   On: 12/29/2016 01:37   Ct Lumbar Spine Wo Contrast  Result Date: 12/29/2016 CLINICAL DATA:  T9 fracture EXAM: CT THORACIC AND LUMBAR SPINE WITHOUT CONTRAST TECHNIQUE: Multidetector CT imaging of the thoracic and lumbar spine was performed without contrast. Multiplanar CT image reconstructions were also generated. COMPARISON:  Same day radiographs of the thoracic spine from 12/28/2016. Lateral CXR 08/05/2016 FINDINGS: CT THORACIC SPINE FINDINGS Alignment: Maintained thoracic curvature. Vertebrae: Burst fracture with 50% height loss of the T9 vertebral body with 3 mm of retropulsion of the posterior cortex. Eccentric vertical striations noted of the T5 through T11 vertebral bodies consistent with osteopenia. Schmorl's nodes noted along the inferior endplate of T8, T9 and H06 Paraspinal and other soft tissues: Mild paraspinal soft tissue edema about the T9 fracture. No focal paraspinal hematoma. Findings would suggest a recent fracture. Moderate-sized hiatal hernia. Coronary arteriosclerosis and aortic atherosclerosis. Disc levels: Degenerative disc disease with disc space narrowing of the included lower cervical spine from C4-5, C5-6 and C6-7. No  focal thoracic spine disc herniations. Schmorl's nodes along the inferior endplates of T7, T8, C37 and T11. CT LUMBAR SPINE FINDINGS Segmentation: 5 lumbar type vertebrae. Alignment: Dextroscoliosis of the lumbar spine with apex at L3. Vertebrae: No acute fracture. Paraspinal and other soft tissues: No paraspinal soft tissue hematoma or mass. No adenopathy. Disc levels: Schmorl's nodes noted at T11 and T12. There is degenerative disc disease at all levels of the lumbar spine with associated facet arthropathy. Left lateral disc bulge at L2-3. There  is grade 1 retrolisthesis of L2 on L3, L3 on L4 and L4 and L5. IMPRESSION: CT THORACIC SPINE IMPRESSION Burst type fracture with 50% height loss of the T9 vertebral body with 3 mm retropulsion causing mild bony central canal stenosis. CT LUMBAR SPINE IMPRESSION Dextroscoliosis with lumbar spondylosis. Retrolisthesis of L2 on L3, L3 on L4 and L4 on L5 grade 1. No acute lumbar spine fracture. Electronically Signed   By: Ashley Royalty M.D.   On: 12/29/2016 01:37    EKG: Independently reviewed.   Assessment/Plan Principal Problem:   Thoracic spine fracture (HCC) Active Problems:   Hyperlipidemia   GERD   Essential hypertension, benign   History of fractured rib   Depression   Fall   Back pain   AKI (acute kidney injury) ([place])    Mechanical fall causing thoracic compression fracture -Admit to medical surgical floor -Neurosurgery consult in ED who suggested bracing the thoracic area and managing pain -Pain control and gentle hydration. Bowel regimen as needed -PT/OT. Fall precaution -Bedrest until cleared by physical therapy  Acute kidney injury -Likely prerenal in nature, monitor creatinine level and avoid nephrotoxic drugs -We'll check CK levels to make sure he is not having rhabdo in the setting of fall -Monitor input and output  History of depression -Continue home medications  Hypertension -Continue clonidine  BPH -Continue  Flomax  COPD -Nebs prn  GERD PPI  DVT prophylaxis: Subcutaneous heparin Code Status: Full Family Communication: Patient comprehends well Consults called: Neurosurgery called in the ED Admission status: MedSurg floor   Ankit Arsenio Loader MD [facility]   If 7PM-7AM, please contact night-coverage [email or web address] Password [email or web address]  12/29/2016, 5:15 AM

## 2016-12-29 NOTE — ED Notes (Signed)
No respiratory or acute distress noted alert and oriented x 3 call light in reach no reaction to medication noted states no relief with pain medication given.

## 2016-12-29 NOTE — ED Notes (Signed)
No respiratory or acute distress noted no reaction to medication noted call light in reach alert and oriented x 3 moves all extremities.

## 2016-12-29 NOTE — Evaluation (Signed)
Occupational Therapy Evaluation Patient Details Name: Peter Wolfe MRN: [id number] DOB: 05-Sep-1929 Today's Date: 12/29/2016    History of Present Illness 83 yo male admitted after fall getting out of his car. Recently had HHPT for torn achilles. In the ER patient appeared in moderate distress. He was given pain medications and CT of the spine was done which showed burst type fracture with 50% height loss of T9 vertebral body with 3 mm retropulsion.  Neurosurgery was consulted who recommended conservative management at this time with a TLSO.   Clinical Impression   This 83 yo male admitted with above presents to acute OT with deficits below (see OT problem list) thus affecting his PLOF of Independent with basic ADLs and IADLs. He will benefit from acute OT with follow up OT at SNF.     Follow Up Recommendations  SNF;Supervision/Assistance - 24 hour    Equipment Recommendations  Other (comment) (TBD at next venue)       Precautions / Restrictions Precautions Precautions: Back Precaution Comments: reviewed log roll, no bending, that the brace may need to be donned in supine not sitting (order is confusing, but when speaking to RN has to don in supine); there is a question as to whether he also has to wear it in bed. Spinal Brace: Thoracolumbosacral orthotic;Applied in supine position Restrictions Weight Bearing Restrictions: No      Mobility Bed Mobility Overal bed mobility: Needs Assistance Bed Mobility: Rolling;Sidelying to Sit Rolling: Min assist Sidelying to sit: Min guard (HOB flat and use of rail)       General bed mobility comments: cues to flex knees and log roll then push up to sitting. The TLSO was on the patient in bed-- made him aware that he should not be wearing it in bed  Transfers Overall transfer level: Needs assistance Equipment used: Rolling walker (2 wheeled) Transfers: Sit to/from Stand Sit to Stand: Min assist         General transfer comment:  cues for safe hand placement    Balance Overall balance assessment: History of Falls;Needs assistance Sitting-balance support: No upper extremity supported;Feet supported Sitting balance-Leahy Scale: Good     Standing balance support: Bilateral upper extremity supported;During functional activity Standing balance-Leahy Scale: Fair Standing balance comment: reliant on RW                           ADL either performed or assessed with clinical judgement   ADL Overall ADL's : Needs assistance/impaired Eating/Feeding: Independent;Sitting   Grooming: Set up;Sitting   Upper Body Bathing: Minimal assistance;Bed level   Lower Body Bathing: Moderate assistance;With adaptive equipment (min A sit<>stand)   Upper Body Dressing : Maximal assistance;Bed level   Lower Body Dressing: Moderate assistance;With adaptive equipment (min A sit<>stand)   Toilet Transfer: Minimal assistance;RW;Stand-pivot Toilet Transfer Details (indicate cue type and reason): bed>recliner next to bed Toileting- Clothing Manipulation and Hygiene: Moderate assistance Toileting - Clothing Manipulation Details (indicate cue type and reason): min A sit<>stand       General ADL Comments: Pt total A to don/doff brace supine and to adjust brace supine     Vision Patient Visual Report: No change from baseline              Pertinent Vitals/Pain Pain Assessment: 0-10 Faces Pain Scale: Hurts little more Pain Location: back Pain Descriptors / Indicators: Sore Pain Intervention(s): Limited activity within patient's tolerance;Monitored during session;Repositioned     Hand Dominance Right  Extremity/Trunk Assessment Upper Extremity Assessment Upper Extremity Assessment: Overall WFL for tasks assessed           Communication Communication Communication: No difficulties   Cognition Arousal/Alertness: Awake/alert Behavior During Therapy: WFL for tasks assessed/performed Overall Cognitive Status:  Within Functional Limits for tasks assessed                                                Home Living Family/patient expects to be discharged to:: Private residence Living Arrangements: Alone   Type of Home: Apartment Home Access: Level entry     Home Layout: One level               Home Equipment: Walker - 2 wheels;Cane - single point;Tub bench          Prior Functioning/Environment Level of Independence: Needs assistance  Gait / Transfers Assistance Needed: mod. I with 2 canwes or RW. Drives ADL's / Homemaking Assistance Needed: friend assist with groceries.             OT Problem List: Decreased strength;Decreased range of motion;Impaired balance (sitting and/or standing);Pain;Decreased knowledge of use of DME or AE      OT Treatment/Interventions: Self-care/ADL training;Patient/family education;DME and/or AE instruction;Balance training;Therapeutic activities    OT Goals(Current goals can be found in the care plan section) Acute Rehab OT Goals Patient Stated Goal: to go home and take care of my bills OT Goal Formulation: With patient Time For Goal Achievement: 01/12/17 Potential to Achieve Goals: Good  OT Frequency: Min 2X/week   Barriers to D/C: Decreased caregiver support             End of Session Equipment Utilized During Treatment: Rolling walker;Back brace Nurse Communication:  (NT: for now pt to wear brace at all times, until RN gets clarification to have it on or off in bed)  Activity Tolerance: Patient tolerated treatment well Patient left: in chair;with call bell/phone within reach;with chair alarm set  OT Visit Diagnosis: Unsteadiness on feet (R26.81);History of falling (Z91.81);Pain Pain - part of body:  (back)                Time: 0300-9233 OT Time Calculation (min): 25 min Charges:  OT General Charges $OT Visit: 1 Procedure OT Evaluation $OT Eval Moderate Complexity: 1 Procedure OT Treatments $Self Care/Home  Management : 8-22 mins [place], OTR/L [phone] 12/29/2016

## 2016-12-29 NOTE — ED Notes (Signed)
No respiratory or acute distress noted alert and oriented x 3 call light in reach no reaction to medication noted. 

## 2016-12-29 NOTE — Progress Notes (Signed)
CSW spoke with patient at bedside regarding SNF. Patient reports that he is now interested in SNF. CSW discussed patient's Medicare insurance with patient, patient reported that he cannot private pay for SNF. RNCM came to speak with patient regarding home health options.

## 2016-12-29 NOTE — Progress Notes (Signed)
PT note-Addendum- After evaluation by OT, the patient is interested in pursuing SNF for Rehab. Tresa Endo PT [phone]

## 2016-12-29 NOTE — Care Management Note (Signed)
Case Management Note  Patient Details  Name: PHUOC HUY MRN: [id number] Date of Birth: 05-26-1929  Subjective/Objective: Noted per CSW doesn't qualify for SNF-no 3 day qualifying stay. CM/CSW spoke to patient in rm about d/c plan-what medicare pays for,HHC,& transportation issues. Patient voiced understanding-Used [facility] in past will use again-[facility] rep Kim aware of [facility] orders, & d/c. Ambulance transportation needed via [facility]-[phone]-forms in shadow chart for nurse to call when ready-confirmed address.                   Action/Plan:d/c home w/HHC/ambulance transp.   Expected Discharge Date:  12/31/16               Expected Discharge Plan:  [place]  In-House Referral:  Clinical Social Work  Discharge planning Services  CM Consult  Post Acute Care Choice:    Choice offered to:     DME Arranged:    DME Agency:     HH Arranged:  RN, PT, OT, Nurse's Aide, Social Work [employer] Agency:     Status of Service:  Completed, signed off  If discussed at [employer], dates discussed:    Additional Comments:  Dessa Phi, RN 12/29/2016, 4:26 PM

## 2016-12-29 NOTE — Progress Notes (Signed)
PT Cancellation Note  Patient Details Name: EAN GETTEL MRN: [id number] DOB: 03/08/1929   Cancelled Treatment:    Reason Eval/Treat Not Completed: Patient not medically ready (Awaiting neurosurgery consult for T9 fracture. Noted  may need  TLSO. will await clearance to mobilize)   Marcelino Freestone PT [phone]  12/29/2016, 8:06 AM

## 2016-12-29 NOTE — Progress Notes (Signed)
MEDICATION RELATED CONSULT NOTE - INITIAL   Pharmacy Consult for Renal adjustment Indication: AKI  Allergies  Allergen Reactions  . Influenza Virus Vacc Split Pf     REACTION: allergic to eggs  . Doxycycline Rash  . Eggs Or Egg-Derived Products Rash    Patient Measurements: Height: 5\' 6"  (167.6 cm) Weight: 140 lb (63.5 kg) IBW/kg (Calculated) : 63.8 Adjusted Body Weight:   Vital Signs: Temp: 98.4 F (36.9 C) (04/02 0407) Temp Source: Oral (04/02 0407) BP: 142/76 (04/02 0505) Pulse Rate: 85 (04/02 0505) Intake/Output from previous day: No intake/output data recorded. Intake/Output from this shift: No intake/output data recorded.  Labs:  Recent Labs  12/29/16 0111  WBC 8.1  HGB 10.5*  HCT 29.7*  PLT 126*  CREATININE 1.90*   Estimated Creatinine Clearance: 25.5 mL/min (A) (by C-G formula based on SCr of 1.9 mg/dL (H)).   Microbiology: No results found for this or any previous visit (from the past 720 hour(s)).  Medical History: Past Medical History:  Diagnosis Date  . BPH (benign prostatic hyperplasia)   . Broken rib   . COPD (chronic obstructive pulmonary disease) ([place])   . GERD (gastroesophageal reflux disease)   . GERD (gastroesophageal reflux disease)   . Hyperlipidemia   . Hypertension   . Insomnia   . Panic attack     Medications:   (Not in a hospital admission) Scheduled:  . busPIRone  30 mg Oral Daily  . cloNIDine  0.1 mg Oral Daily  . FLUoxetine  40 mg Oral Daily  . heparin  5,000 Units Subcutaneous Q8H  . mirtazapine  45 mg Oral QHS  . multivitamin with minerals  1 tablet Oral Daily  . naproxen sodium  220 mg Oral BID WC  . pantoprazole  40 mg Oral Daily  . simvastatin  10 mg Oral QHS  . tamsulosin  0.4 mg Oral Daily    Assessment: Patient with AKI (CrCl ~25), order for buspirone and naproxen.  Goal of Therapy:  Safe and effective use of medcations  Plan:  D/C meds at this time due to renal function. Consider restarting when  CrCl > 37mL/min  Tyler Deis, Rheya [place] 12/29/2016,5:58 AM

## 2016-12-29 NOTE — ED Provider Notes (Signed)
Patient seen/examined in the Emergency Department in conjunction with Midlevel Provider  Patient reports fall with acute back pain Exam : awake/alert, no distress, no focal weakness to lower extremities Plan: pt noted to have acute thoracic fracture Additional imaging ordered He will need TLSO brace He may need Case management/SW consult     Ripley Fraise, MD 12/29/16 0107

## 2016-12-30 ENCOUNTER — Emergency Department (HOSPITAL_COMMUNITY)
Admission: EM | Admit: 2016-12-30 | Discharge: 2016-12-30 | Disposition: A | Discharge status: Home / Self Care | Payer: Medicare Other | Source: Home / Self Care | Attending: Emergency Medicine | Admitting: Emergency Medicine

## 2016-12-30 ENCOUNTER — Telehealth: Payer: Self-pay

## 2016-12-30 ENCOUNTER — Encounter (HOSPITAL_COMMUNITY): Payer: Self-pay | Admitting: Emergency Medicine

## 2016-12-30 DIAGNOSIS — S22070A Wedge compression fracture of T9-T10 vertebra, initial encounter for closed fracture: Secondary | ICD-10-CM

## 2016-12-30 DIAGNOSIS — I10 Essential (primary) hypertension: Secondary | ICD-10-CM | POA: Insufficient documentation

## 2016-12-30 DIAGNOSIS — Y999 Unspecified external cause status: Secondary | ICD-10-CM

## 2016-12-30 DIAGNOSIS — J449 Chronic obstructive pulmonary disease, unspecified: Secondary | ICD-10-CM | POA: Insufficient documentation

## 2016-12-30 DIAGNOSIS — E876 Hypokalemia: Secondary | ICD-10-CM | POA: Diagnosis not present

## 2016-12-30 DIAGNOSIS — E861 Hypovolemia: Secondary | ICD-10-CM | POA: Diagnosis not present

## 2016-12-30 DIAGNOSIS — R339 Retention of urine, unspecified: Secondary | ICD-10-CM | POA: Diagnosis not present

## 2016-12-30 DIAGNOSIS — N390 Urinary tract infection, site not specified: Secondary | ICD-10-CM | POA: Diagnosis not present

## 2016-12-30 DIAGNOSIS — S22000A Wedge compression fracture of unspecified thoracic vertebra, initial encounter for closed fracture: Secondary | ICD-10-CM | POA: Insufficient documentation

## 2016-12-30 DIAGNOSIS — Y929 Unspecified place or not applicable: Secondary | ICD-10-CM

## 2016-12-30 DIAGNOSIS — Y939 Activity, unspecified: Secondary | ICD-10-CM

## 2016-12-30 DIAGNOSIS — K5900 Constipation, unspecified: Secondary | ICD-10-CM | POA: Diagnosis not present

## 2016-12-30 DIAGNOSIS — M549 Dorsalgia, unspecified: Secondary | ICD-10-CM | POA: Diagnosis not present

## 2016-12-30 DIAGNOSIS — W1830XA Fall on same level, unspecified, initial encounter: Secondary | ICD-10-CM | POA: Insufficient documentation

## 2016-12-30 DIAGNOSIS — D649 Anemia, unspecified: Secondary | ICD-10-CM | POA: Diagnosis not present

## 2016-12-30 DIAGNOSIS — Z79899 Other long term (current) drug therapy: Secondary | ICD-10-CM

## 2016-12-30 DIAGNOSIS — R110 Nausea: Secondary | ICD-10-CM | POA: Diagnosis not present

## 2016-12-30 DIAGNOSIS — E871 Hypo-osmolality and hyponatremia: Secondary | ICD-10-CM | POA: Diagnosis not present

## 2016-12-30 DIAGNOSIS — D696 Thrombocytopenia, unspecified: Secondary | ICD-10-CM | POA: Diagnosis not present

## 2016-12-30 DIAGNOSIS — R6251 Failure to thrive (child): Secondary | ICD-10-CM | POA: Diagnosis not present

## 2016-12-30 LAB — VITAMIN D 25 HYDROXY (VIT D DEFICIENCY, FRACTURES): VIT D 25 HYDROXY: 73 ng/mL (ref 30.0–100.0)

## 2016-12-30 MED ORDER — HYDROCODONE-ACETAMINOPHEN 5-325 MG PO TABS
2.0000 | ORAL_TABLET | Freq: Once | ORAL | Status: AC
  Administered 2016-12-30: 2 via ORAL
  Filled 2016-12-30: qty 2

## 2016-12-30 NOTE — ED Notes (Signed)
Pt provided with cab voucher from [place] with social work.

## 2016-12-30 NOTE — ED Notes (Addendum)
States he lives alone and hasn't had prescriptions filled. States that friends that normally help him have been busy and couldn't go to drugstore. Complains of decreased urination x 2 days. Has voided this am. No tenderness to bladder on palpitation. Bladder can completed

## 2016-12-30 NOTE — Discharge Planning (Signed)
[facility] and EDSW consulted regarding discharge disposition.  Pt was discharged from [facility] yesterday wih [place] services.  Pt states he is able to get medications as he has a friend take him to [facility] and he also has his regular scripts delivered. Pt aware that [facility] will give him a call to set up initial visit and it could take 24-48 hours.  No further CM/SW needs identified at this time.

## 2016-12-30 NOTE — Discharge Instructions (Signed)
Follow up with your Physician as directed.  Pain medication a previously prescribed.

## 2016-12-30 NOTE — Telephone Encounter (Signed)
Pt being evaluate at ED.

## 2016-12-30 NOTE — ED Provider Notes (Signed)
[facility] DEPT Provider Note   CSN: [id number] Arrival date & time: 12/30/16  0940     History   Chief Complaint Chief Complaint  Patient presents with  . Back Pain  . Urinary Retention    HPI ADONIS Peter Wolfe is a 83 y.o. male.  The history is provided by the patient. No language interpreter was used.  Back Pain   This is a new problem. The problem occurs constantly. The problem has been gradually worsening. The pain is associated with falling. The pain is present in the thoracic spine. The quality of the pain is described as stabbing. The pain is moderate.  Pt is having trouble walking at home.  He reports he was unable to get his pain medications filled.  Past Medical History:  Diagnosis Date  . BPH (benign prostatic hyperplasia)   . Broken rib   . COPD (chronic obstructive pulmonary disease) ([place])   . GERD (gastroesophageal reflux disease)   . GERD (gastroesophageal reflux disease)   . Hyperlipidemia   . Hypertension   . Insomnia   . Panic attack     Patient Active Problem List   Diagnosis Date Noted  . Fall 12/29/2016  . Thoracic spine fracture ([place]) 12/29/2016  . Back pain 12/29/2016  . AKI (acute kidney injury) ([place]) 12/29/2016  . Depression 06/03/2016  . History of fractured rib 11/28/2015  . History of peripheral edema 11/28/2015  . Seborrheic keratosis, inflamed 08/13/2015  . Actinic keratosis 08/13/2015  . Fatigue 07/31/2015  . Essential hypertension, benign 07/31/2015  . URINARY FREQUENCY 08/16/2009  . URTICARIA 11/13/2007  . Hyperlipidemia 06/08/2007  . GERD 06/08/2007  . PANIC ATTACK 05/07/2007  . COPD 05/07/2007  . INSOMNIA 05/07/2007    Past Surgical History:  Procedure Laterality Date  . CHOLECYSTECTOMY         Home Medications    Prior to Admission medications   Medication Sig Start Date End Date Taking? Authorizing Provider  albuterol (PROVENTIL HFA;VENTOLIN HFA) 108 (90 Base) MCG/ACT inhaler Inhale 2 puffs into the  lungs every 6 (six) hours as needed. Patient taking differently: Inhale 2 puffs into the lungs every 6 (six) hours as needed for wheezing or shortness of breath.  07/28/16  Yes Lucretia Kern, DO  busPIRone (BUSPAR) 30 MG tablet TAKE 1 TABLET DAILY Patient taking differently: TAKE 30mg  by mouth once DAILY 07/29/16  Yes Dorena Cookey, MD  cloNIDine (CATAPRES) 0.1 MG tablet TAKE 1 TABLET DAILY Patient taking differently: TAKE 0.1mg  by mouth once DAILY 07/29/16  Yes Dorena Cookey, MD  FLUoxetine (PROZAC) 40 MG capsule Take 1 capsule (40 mg total) by mouth daily. 08/29/16  Yes [name], NP  mirtazapine (REMERON) 45 MG tablet Take 1 tablet (45 mg total) by mouth at bedtime. 09/26/16  Yes [name], NP  Multiple Vitamin (MULTIVITAMIN) capsule Take 1 capsule by mouth daily.   Yes Historical Provider, MD  omeprazole (PRILOSEC) 20 MG capsule TAKE 1 CAPSULE DAILY Patient taking differently: TAKE 20mg  CAPSULE by mouth once DAILY 07/29/16  Yes Dorena Cookey, MD  simvastatin (ZOCOR) 10 MG tablet TAKE 1 TABLET AT BEDTIME Patient taking differently: TAKE 10mg  by mouth once daily 07/29/16  Yes Dorena Cookey, MD  tamsulosin (FLOMAX) 0.4 MG CAPS capsule TAKE 1 CAPSULE DAILY Patient taking differently: TAKE 0.4mg  CAPSULE by mouth once DAILY 07/29/16  Yes Dorena Cookey, MD  traZODone (DESYREL) 100 MG tablet TAKE 1 TABLET AT BEDTIME 07/29/16  Yes Dorena Cookey, MD  docusate sodium (COLACE) 100 MG capsule Take 1 capsule (100 mg total) by mouth 2 (two) times daily as needed for mild constipation. 12/29/16   Lavina Hamman, MD  HYDROcodone-acetaminophen (NORCO/VICODIN) 5-325 MG tablet Take 1 tablet by mouth every 4 (four) hours as needed for moderate pain. 12/29/16   Lavina Hamman, MD  polyethylene glycol [facility]) packet Take 17 g by mouth daily. 12/29/16   Lavina Hamman, MD    Family History Family History  Problem Relation Age of Onset  . Emphysema Father   . Alcohol abuse Father   . Diabetes Sister      Social History Social History  Substance Use Topics  . Smoking status: Never Smoker  . Smokeless tobacco: Never Used  . Alcohol use 0.0 oz/week     Comment: Wine with meals      Allergies   Influenza virus vacc split pf; Doxycycline; and Eggs or egg-derived products   Review of Systems Review of Systems  Musculoskeletal: Positive for back pain.  All other systems reviewed and are negative.    Physical Exam Updated Vital Signs BP (!) 171/90   Pulse 65   Temp 98.2 F (36.8 C) (Oral)   Resp 14   SpO2 97%   Physical Exam  Constitutional: He appears well-developed and well-nourished.  HENT:  Head: Normocephalic and atraumatic.  Eyes: Conjunctivae are normal.  Neck: Neck supple.  Cardiovascular: Normal rate and regular rhythm.   No murmur heard. Pulmonary/Chest: Effort normal and breath sounds normal. No respiratory distress.  Abdominal: Soft. There is no tenderness.  Musculoskeletal: He exhibits no edema.  Tender thoracic spine to palpation  Neurological: He is alert.  Skin: Skin is warm and dry.  Psychiatric: He has a normal mood and affect.  Nursing note and vitals reviewed.    ED Treatments / Results  Labs (all labs ordered are listed, but only abnormal results are displayed) Labs Reviewed - No data to display  EKG  EKG Interpretation None       Radiology Dg Ribs Unilateral W/chest Right  Result Date: 12/29/2016 CLINICAL DATA:  [place] on Wednesday getting out of car. History of right rib fractures. Complaining of mid to upper back pain and right-sided generalized rib pain. EXAM: RIGHT RIBS AND CHEST - 3+ VIEW COMPARISON:  None. FINDINGS: Moderate to large hiatal hernia. Aortic atherosclerosis with slight uncoiling. Emphysematous hyperinflation of the lungs without pneumonic consolidation, hemothorax or pneumothorax. Subacute to chronic appearing right fifth, anterior seventh and chronic appearing anterior tenth and eleventh rib fractures are noted. No  acute displaced appearing fracture is visualized. Degenerative disc disease with spurring of the endplates at M5-7. IMPRESSION: 1. Subacute to chronic right fifth, anterior seventh, tenth and eleventh rib fractures. 2. Moderate to large hiatal hernia. 3. Aortic atherosclerosis. 4. Emphysematous hyperinflation of the lungs. 5. Degenerative disc disease L1-2 with spurring of the endplates. Electronically Signed   By: Ashley Royalty M.D.   On: 12/29/2016 00:15   Dg Thoracic Spine 2 View  Result Date: 12/29/2016 CLINICAL DATA:  Neck pain after fall EXAM: THORACIC SPINE 2 VIEWS COMPARISON:  Lateral chest radiograph from 08/05/2016 FINDINGS: New moderate 50% compression of the T9 vertebral body is noted since prior exam. There appears be some 4 mm of retropulsion of the posterior superior corner of T9. Mild multilevel degenerative disc disease of the dorsal spine. Degenerative disc disease with vacuum disc phenomena noted at T12-L1 and L1-2. IMPRESSION: 50% anterior compression fracture the T9 vertebral body with 4  mm of retropulsion. Electronically Signed   By: Ashley Royalty M.D.   On: 12/29/2016 00:19   Ct Thoracic Spine Wo Contrast  Result Date: 12/29/2016 CLINICAL DATA:  T9 fracture EXAM: CT THORACIC AND LUMBAR SPINE WITHOUT CONTRAST TECHNIQUE: Multidetector CT imaging of the thoracic and lumbar spine was performed without contrast. Multiplanar CT image reconstructions were also generated. COMPARISON:  Same day radiographs of the thoracic spine from 12/28/2016. Lateral CXR 08/05/2016 FINDINGS: CT THORACIC SPINE FINDINGS Alignment: Maintained thoracic curvature. Vertebrae: Burst fracture with 50% height loss of the T9 vertebral body with 3 mm of retropulsion of the posterior cortex. Eccentric vertical striations noted of the T5 through T11 vertebral bodies consistent with osteopenia. Schmorl's nodes noted along the inferior endplate of T8, T9 and I94 Paraspinal and other soft tissues: Mild paraspinal soft tissue edema  about the T9 fracture. No focal paraspinal hematoma. Findings would suggest a recent fracture. Moderate-sized hiatal hernia. Coronary arteriosclerosis and aortic atherosclerosis. Disc levels: Degenerative disc disease with disc space narrowing of the included lower cervical spine from C4-5, C5-6 and C6-7. No focal thoracic spine disc herniations. Schmorl's nodes along the inferior endplates of T7, T8, W54 and T11. CT LUMBAR SPINE FINDINGS Segmentation: 5 lumbar type vertebrae. Alignment: Dextroscoliosis of the lumbar spine with apex at L3. Vertebrae: No acute fracture. Paraspinal and other soft tissues: No paraspinal soft tissue hematoma or mass. No adenopathy. Disc levels: Schmorl's nodes noted at T11 and T12. There is degenerative disc disease at all levels of the lumbar spine with associated facet arthropathy. Left lateral disc bulge at L2-3. There is grade 1 retrolisthesis of L2 on L3, L3 on L4 and L4 and L5. IMPRESSION: CT THORACIC SPINE IMPRESSION Burst type fracture with 50% height loss of the T9 vertebral body with 3 mm retropulsion causing mild bony central canal stenosis. CT LUMBAR SPINE IMPRESSION Dextroscoliosis with lumbar spondylosis. Retrolisthesis of L2 on L3, L3 on L4 and L4 on L5 grade 1. No acute lumbar spine fracture. Electronically Signed   By: Ashley Royalty M.D.   On: 12/29/2016 01:37   Ct Lumbar Spine Wo Contrast  Result Date: 12/29/2016 CLINICAL DATA:  T9 fracture EXAM: CT THORACIC AND LUMBAR SPINE WITHOUT CONTRAST TECHNIQUE: Multidetector CT imaging of the thoracic and lumbar spine was performed without contrast. Multiplanar CT image reconstructions were also generated. COMPARISON:  Same day radiographs of the thoracic spine from 12/28/2016. Lateral CXR 08/05/2016 FINDINGS: CT THORACIC SPINE FINDINGS Alignment: Maintained thoracic curvature. Vertebrae: Burst fracture with 50% height loss of the T9 vertebral body with 3 mm of retropulsion of the posterior cortex. Eccentric vertical  striations noted of the T5 through T11 vertebral bodies consistent with osteopenia. Schmorl's nodes noted along the inferior endplate of T8, T9 and O27 Paraspinal and other soft tissues: Mild paraspinal soft tissue edema about the T9 fracture. No focal paraspinal hematoma. Findings would suggest a recent fracture. Moderate-sized hiatal hernia. Coronary arteriosclerosis and aortic atherosclerosis. Disc levels: Degenerative disc disease with disc space narrowing of the included lower cervical spine from C4-5, C5-6 and C6-7. No focal thoracic spine disc herniations. Schmorl's nodes along the inferior endplates of T7, T8, O35 and T11. CT LUMBAR SPINE FINDINGS Segmentation: 5 lumbar type vertebrae. Alignment: Dextroscoliosis of the lumbar spine with apex at L3. Vertebrae: No acute fracture. Paraspinal and other soft tissues: No paraspinal soft tissue hematoma or mass. No adenopathy. Disc levels: Schmorl's nodes noted at T11 and T12. There is degenerative disc disease at all levels of the lumbar spine with  associated facet arthropathy. Left lateral disc bulge at L2-3. There is grade 1 retrolisthesis of L2 on L3, L3 on L4 and L4 and L5. IMPRESSION: CT THORACIC SPINE IMPRESSION Burst type fracture with 50% height loss of the T9 vertebral body with 3 mm retropulsion causing mild bony central canal stenosis. CT LUMBAR SPINE IMPRESSION Dextroscoliosis with lumbar spondylosis. Retrolisthesis of L2 on L3, L3 on L4 and L4 on L5 grade 1. No acute lumbar spine fracture. Electronically Signed   By: Ashley Royalty M.D.   On: 12/29/2016 01:37    Procedures Procedures (including critical care time)  Medications Ordered in ED Medications  HYDROcodone-acetaminophen (NORCO/VICODIN) 5-325 MG per tablet 2 tablet (not administered)     Initial Impression / Assessment and Plan / ED Course  I have reviewed the triage vital signs and the nursing notes.  Pertinent labs & imaging results that were available during my care of the  patient were reviewed by me and considered in my medical decision making (see chart for details).     *social work and case management evaluated pt.  Pt has home heatlh coming out.  He has a friend who will fill rx.  transportaion home arranged.  Final Clinical Impressions(s) / ED Diagnoses   Final diagnoses:  Closed wedge compression fracture of ninth thoracic vertebra, initial encounter [facility])    New Prescriptions New Prescriptions   No medications on file  An After Visit Summary was printed and given to the patient.   Hollace Kinnier Renningers, PA-C 12/30/16 [place], MD 01/01/17 [phone]

## 2016-12-30 NOTE — Progress Notes (Signed)
CSW and RN Case Manager engaged with Patient at his bedside. CSW explained to Patient that as noted yesterday by CSW at [facility], Patient does not have a 3 day inpatient qualifying stay for SNF placement. Patient was set up with with [place] services. Pt states he is able to get medications as he has a friend take him to [facility] and he also has his regular scripts delivered. Pt aware that [facility] will give him a call to set up initial visit and it could take 24-48 hours. CSW inquired about private pay for SNF placement. Patient reports that he is "living month to month", and cannot afford to pay for facility placement out of pocket. Patient requesting pain medication prior to D/C. CSW has staffed with bedside RN regarding Patient's medication requests. CSW to provide Patient with a taxi voucher. No further concerns/questions identified. CSW signing off. Please contact should new need(s) arise.    Lorrine Kin, MSW, LCSW [facility] ED/43M Clinical Social Worker [phone]

## 2016-12-30 NOTE — Discharge Summary (Signed)
[facility] Discharge Summary   Patient: Peter Wolfe EGB:[id number]   PCP: [name], NP DOB: 1929/08/04   Date of admission: 12/28/2016   Date of discharge: 12/29/2016    Discharge Diagnoses:  Principal Problem:   Thoracic spine fracture [facility]) Active Problems:   Hyperlipidemia   GERD   Essential hypertension, benign   History of fractured rib   Depression   Fall   Back pain   AKI (acute kidney injury) ([place])   Admitted From: Home Disposition:  Home with home health  Recommendations for Outpatient Follow-up:  1. Please follow-up with PCP in 1-2 weeks, please get repeat BMP drawn on Wednesday, 12/31/2016 and sent to PCPs office.  2. Patient clearly instructed not to remove the brace unless he has some assistance to added back as he cannot wear the brace in sitting position and has to added before restart sitting up or standing up in lying down.  Follow-up Information    [name], NP. Schedule an appointment as soon as possible for a visit in 1 week(s).   Specialty:  Family Medicine Why:  with BMP Contact information: [place] [address] [phone]        Kevan Ny Ditty, MD. Schedule an appointment as soon as possible for a visit in 1 month(s).   Specialty:  Neurosurgery Contact information: [address] [place] [address] [place] Follow up.   Why:  [facility], physical therapy,occupational Visual merchandiser information: [place] [address] [phone]          Diet recommendation: Cardiac diet  Activity: The patient is advised to gradually reintroduce usual activities.  Discharge Condition: good  Code Status: Full code  History of present illness: As per the H and P dictated on admission, "LATHEN SEAL is a 83 y.o. male with medical history significant of hypertension, hyperlipidemia, GERD, BPH  comes to the ED with the complaints of back pain. Patient states he experienced mechanical fall about 5 days ago on last Wednesday. Patient has been recovering from torn achilles for which he received 16 weeks of home PT and now uses 2 canes. So on last Wednesday when he tried to get out of his car he said he didn't feel right but did not have any previous syncopal or warning signs and he ended up falling to the ground. She laid on the ground and eventually saw a couple of gentlemen around his apartment complex and call for help to get him back as apartment. Patient lives alone at home and he remained bedbound for at least 6 hours after getting in the house. Over the course of next 2 or 3 days he stated his pain improved just a little bit and was able to do minimal activity but his pain returned and it progressively got worse to a point where he could not bear it. He describes his pain as intermittent thoracic back area and coming across torso from from both sides. No other complaints at this time  In the ER patient appeared in moderate distress. He was given pain medications and CT of the spine was done which showed burst type fracture with 50% height loss of T9 vertebral body with 3 mm retropulsion. His routine labs showed elevated creatinine of 1.9 up from his baseline of around 1.2. Neurosurgery was consulted who recommended conservative management at this time. Patient did receive  several doses of pain medication with minimal relief.   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 Recently he received about 16 weeks of home care/physical therapy for torn achilles which he completed about 4 weeks ago. "  Hospital Course:  Summary of his active problems in the hospital is as following. Principal Problem:   Thoracic spine fracture (HCC) No Neurological deficit on examination. Discussed with neurosurgery who  recommends a TLSO brace and physical therapy. Patient will follow up with neurosurgery Dr. Cyndy Freeze as an outpatient. Patient was instructed to wear the brace only in lying down position and do not remove it and less he has some help and assistance with getting good. Initially patient agreed to go to SNF but later on wanted to go home with home health. Case manager was consulted and home with home health was arranged.  Active Problems:   Hyperlipidemia Continue statin    GERD Continue home regimen    Essential hypertension, benign Continue clonidine, hold triamterene and hydrochlorothiazide given acute kidney injury.   Depression Continue home regimen.    AKI (acute kidney injury) ([place]) Urine output adequate, patient is able to tolerate by mouth liquids. Renal function worsened likely secondary to poor oral intake and prerenal etiology. Initial plan was to give patient IV hydration aggressively. Since the patient is able to tolerate by mouth liquids will currently hold his diuretic and recommended to follow-up with PCP as an outpatient. I requested home health agency to obtain blood work on Wednesday and fax it to PCP regarding his BMP.  All other chronic medical condition were stable during the hospitalization.  Patient was seen by physical therapy, who recommended home health, which was arranged by Education officer, museum and case Freight forwarder. On the day of the discharge the patient's vitals were stable, and no other acute medical condition were reported by patient. the patient was felt safe to be discharge at home with home health.  Procedures and Results:  none   Consultations:  none  DISCHARGE MEDICATION: Discharge Medication List as of 12/29/2016  7:07 PM    START taking these medications   Details  docusate sodium (COLACE) 100 MG capsule Take 1 capsule (100 mg total) by mouth 2 (two) times daily as needed for mild constipation., Starting Mon 12/29/2016, Normal      HYDROcodone-acetaminophen (NORCO/VICODIN) 5-325 MG tablet Take 1 tablet by mouth every 4 (four) hours as needed for moderate pain., Starting Mon 12/29/2016, Print    polyethylene glycol (MIRALAX) packet Take 17 g by mouth daily., Starting Mon 12/29/2016, Normal      CONTINUE these medications which have NOT CHANGED   Details  albuterol (PROVENTIL HFA;VENTOLIN HFA) 108 (90 Base) MCG/ACT inhaler Inhale 2 puffs into the lungs every 6 (six) hours as needed., Starting Mon 07/28/2016, Normal    busPIRone (BUSPAR) 30 MG tablet TAKE 1 TABLET DAILY, Normal    cloNIDine (CATAPRES) 0.1 MG tablet TAKE 1 TABLET DAILY, Normal    FLUoxetine (PROZAC) 40 MG capsule Take 1 capsule (40 mg total) by mouth daily., Starting Fri 08/29/2016, Normal    mirtazapine (REMERON) 45 MG tablet Take 1 tablet (45 mg total) by mouth at bedtime., Starting Fri 09/26/2016, Normal    Multiple Vitamin (MULTIVITAMIN) capsule Take 1 capsule by mouth daily., Historical Med    omeprazole (PRILOSEC) 20 MG capsule TAKE 1 CAPSULE DAILY, Normal    simvastatin (ZOCOR) 10 MG tablet  TAKE 1 TABLET AT BEDTIME, Normal    tamsulosin (FLOMAX) 0.4 MG CAPS capsule TAKE 1 CAPSULE DAILY, Normal    traZODone (DESYREL) 100 MG tablet TAKE 1 TABLET AT BEDTIME, Normal      STOP taking these medications     naproxen (NAPROSYN) 500 MG tablet      naproxen sodium (ANAPROX) 220 MG tablet      triamterene-hydrochlorothiazide (MAXZIDE) 75-50 MG tablet        Allergies  Allergen Reactions  . Influenza Virus Vacc Split Pf Other (See Comments)    REACTION: allergic to eggs  . Doxycycline Rash  . Eggs Or Egg-Derived Products Rash   Discharge Instructions    Diet - low sodium heart healthy    Complete by:  As directed    Discharge instructions    Complete by:  As directed    It is important that you read following instructions as well as go over your medication list with RN to help you understand your care after this  hospitalization.  Discharge Instructions: Please follow-up with PCP in one week  Please request your primary care physician to go over all Hospital Tests and Procedure/Radiological results at the follow up,  Please get all Hospital records sent to your PCP by signing hospital release before you go home.   Do not drive, operating heavy machinery, perform activities at heights, swimming or participation in water activities or provide baby sitting services while you are on Pain, Sleep and Anxiety Medications; until you have been seen by Primary Care Physician or a Neurologist and advised to do so again. Do not take more than prescribed Pain, Sleep and Anxiety Medications. You were cared for by a hospitalist during your hospital stay. If you have any questions about your discharge medications or the care you received while you were in the hospital after you are discharged, you can call the unit and ask to speak with the hospitalist on call if the hospitalist that took care of you is not available.  Once you are discharged, your primary care physician will handle any further medical issues. Please note that NO REFILLS for any discharge medications will be authorized once you are discharged, as it is imperative that you return to your primary care physician (or establish a relationship with a primary care physician if you do not have one) for your aftercare needs so that they can reassess your need for medications and monitor your lab values. You Must read complete instructions/literature along with all the possible adverse reactions/side effects for all the Medicines you take and that have been prescribed to you. Take any new Medicines after you have completely understood and accept all the possible adverse reactions/side effects. Wear Seat belts while driving. If you have smoked or chewed Tobacco in the last 2 yrs please stop smoking and/or stop any Recreational drug use.   Increase activity slowly     Complete by:  As directed      Discharge Exam: Filed Weights   12/28/16 2048 12/28/16 2057  Weight: 61.2 kg (135 lb) 63.5 kg (140 lb)   Vitals:   12/29/16 0901 12/29/16 1328  BP: (!) 142/72 134/67  Pulse: 71 87  Resp: 17 18  Temp: 98 F (36.7 C) 98.2 F (36.8 C)   General: Appear in mild distress, no Rash; Oral Mucosa moist. Cardiovascular: S1 and S2 Present, no Murmur, no JVD Respiratory: Bilateral Air entry present and Clear to Auscultation, no Crackles, no wheezes Abdomen: Bowel Sound present,  Soft and no tenderness Extremities: no Pedal edema, no calf tenderness Neurology: Grossly no focal neuro deficit.  The results of significant diagnostics from this hospitalization (including imaging, microbiology, ancillary and laboratory) are listed below for reference.    Significant Diagnostic Studies: Dg Ribs Unilateral W/chest Right  Result Date: 12/29/2016 CLINICAL DATA:  [place] on Wednesday getting out of car. History of right rib fractures. Complaining of mid to upper back pain and right-sided generalized rib pain. EXAM: RIGHT RIBS AND CHEST - 3+ VIEW COMPARISON:  None. FINDINGS: Moderate to large hiatal hernia. Aortic atherosclerosis with slight uncoiling. Emphysematous hyperinflation of the lungs without pneumonic consolidation, hemothorax or pneumothorax. Subacute to chronic appearing right fifth, anterior seventh and chronic appearing anterior tenth and eleventh rib fractures are noted. No acute displaced appearing fracture is visualized. Degenerative disc disease with spurring of the endplates at Z6-1. IMPRESSION: 1. Subacute to chronic right fifth, anterior seventh, tenth and eleventh rib fractures. 2. Moderate to large hiatal hernia. 3. Aortic atherosclerosis. 4. Emphysematous hyperinflation of the lungs. 5. Degenerative disc disease L1-2 with spurring of the endplates. Electronically Signed   By: Ashley Royalty M.D.   On: 12/29/2016 00:15   Dg Thoracic Spine 2 View  Result Date:  12/29/2016 CLINICAL DATA:  Neck pain after fall EXAM: THORACIC SPINE 2 VIEWS COMPARISON:  Lateral chest radiograph from 08/05/2016 FINDINGS: New moderate 50% compression of the T9 vertebral body is noted since prior exam. There appears be some 4 mm of retropulsion of the posterior superior corner of T9. Mild multilevel degenerative disc disease of the dorsal spine. Degenerative disc disease with vacuum disc phenomena noted at T12-L1 and L1-2. IMPRESSION: 50% anterior compression fracture the T9 vertebral body with 4 mm of retropulsion. Electronically Signed   By: Ashley Royalty M.D.   On: 12/29/2016 00:19   Ct Thoracic Spine Wo Contrast  Result Date: 12/29/2016 CLINICAL DATA:  T9 fracture EXAM: CT THORACIC AND LUMBAR SPINE WITHOUT CONTRAST TECHNIQUE: Multidetector CT imaging of the thoracic and lumbar spine was performed without contrast. Multiplanar CT image reconstructions were also generated. COMPARISON:  Same day radiographs of the thoracic spine from 12/28/2016. Lateral CXR 08/05/2016 FINDINGS: CT THORACIC SPINE FINDINGS Alignment: Maintained thoracic curvature. Vertebrae: Burst fracture with 50% height loss of the T9 vertebral body with 3 mm of retropulsion of the posterior cortex. Eccentric vertical striations noted of the T5 through T11 vertebral bodies consistent with osteopenia. Schmorl's nodes noted along the inferior endplate of T8, T9 and W96 Paraspinal and other soft tissues: Mild paraspinal soft tissue edema about the T9 fracture. No focal paraspinal hematoma. Findings would suggest a recent fracture. Moderate-sized hiatal hernia. Coronary arteriosclerosis and aortic atherosclerosis. Disc levels: Degenerative disc disease with disc space narrowing of the included lower cervical spine from C4-5, C5-6 and C6-7. No focal thoracic spine disc herniations. Schmorl's nodes along the inferior endplates of T7, T8, E45 and T11. CT LUMBAR SPINE FINDINGS Segmentation: 5 lumbar type vertebrae. Alignment:  Dextroscoliosis of the lumbar spine with apex at L3. Vertebrae: No acute fracture. Paraspinal and other soft tissues: No paraspinal soft tissue hematoma or mass. No adenopathy. Disc levels: Schmorl's nodes noted at T11 and T12. There is degenerative disc disease at all levels of the lumbar spine with associated facet arthropathy. Left lateral disc bulge at L2-3. There is grade 1 retrolisthesis of L2 on L3, L3 on L4 and L4 and L5. IMPRESSION: CT THORACIC SPINE IMPRESSION Burst type fracture with 50% height loss of the T9 vertebral body with 3  mm retropulsion causing mild bony central canal stenosis. CT LUMBAR SPINE IMPRESSION Dextroscoliosis with lumbar spondylosis. Retrolisthesis of L2 on L3, L3 on L4 and L4 on L5 grade 1. No acute lumbar spine fracture. Electronically Signed   By: Ashley Royalty M.D.   On: 12/29/2016 01:37   Ct Lumbar Spine Wo Contrast  Result Date: 12/29/2016 CLINICAL DATA:  T9 fracture EXAM: CT THORACIC AND LUMBAR SPINE WITHOUT CONTRAST TECHNIQUE: Multidetector CT imaging of the thoracic and lumbar spine was performed without contrast. Multiplanar CT image reconstructions were also generated. COMPARISON:  Same day radiographs of the thoracic spine from 12/28/2016. Lateral CXR 08/05/2016 FINDINGS: CT THORACIC SPINE FINDINGS Alignment: Maintained thoracic curvature. Vertebrae: Burst fracture with 50% height loss of the T9 vertebral body with 3 mm of retropulsion of the posterior cortex. Eccentric vertical striations noted of the T5 through T11 vertebral bodies consistent with osteopenia. Schmorl's nodes noted along the inferior endplate of T8, T9 and Y81 Paraspinal and other soft tissues: Mild paraspinal soft tissue edema about the T9 fracture. No focal paraspinal hematoma. Findings would suggest a recent fracture. Moderate-sized hiatal hernia. Coronary arteriosclerosis and aortic atherosclerosis. Disc levels: Degenerative disc disease with disc space narrowing of the included lower cervical spine  from C4-5, C5-6 and C6-7. No focal thoracic spine disc herniations. Schmorl's nodes along the inferior endplates of T7, T8, K48 and T11. CT LUMBAR SPINE FINDINGS Segmentation: 5 lumbar type vertebrae. Alignment: Dextroscoliosis of the lumbar spine with apex at L3. Vertebrae: No acute fracture. Paraspinal and other soft tissues: No paraspinal soft tissue hematoma or mass. No adenopathy. Disc levels: Schmorl's nodes noted at T11 and T12. There is degenerative disc disease at all levels of the lumbar spine with associated facet arthropathy. Left lateral disc bulge at L2-3. There is grade 1 retrolisthesis of L2 on L3, L3 on L4 and L4 and L5. IMPRESSION: CT THORACIC SPINE IMPRESSION Burst type fracture with 50% height loss of the T9 vertebral body with 3 mm retropulsion causing mild bony central canal stenosis. CT LUMBAR SPINE IMPRESSION Dextroscoliosis with lumbar spondylosis. Retrolisthesis of L2 on L3, L3 on L4 and L4 on L5 grade 1. No acute lumbar spine fracture. Electronically Signed   By: Ashley Royalty M.D.   On: 12/29/2016 01:37    Microbiology: No results found for this or any previous visit (from the past 240 hour(s)).   Labs: CBC:  Recent Labs Lab 12/29/16 0111 12/29/16 0827  WBC 8.1 8.1  NEUTROABS 5.1  --   HGB 10.5* 10.0*  HCT 29.7* 28.0*  MCV 97.1 94.9  PLT 126* 185*   Basic Metabolic Panel:  Recent Labs Lab 12/29/16 0111 12/29/16 0827  NA 131* 130*  K 3.6 3.6  CL 98* 98*  CO2 22 24  GLUCOSE 106* 104*  BUN 30* 29*  CREATININE 1.90* 1.86*  CALCIUM 9.4 9.3   Liver Function Tests:  Recent Labs Lab 12/29/16 0827  AST 19  ALT 17  ALKPHOS 77  BILITOT 1.2  PROT 9.6*  ALBUMIN 3.3*   No results for input(s): LIPASE, AMYLASE in the last 168 hours. No results for input(s): AMMONIA in the last 168 hours. Cardiac Enzymes:  Recent Labs Lab 12/29/16 0827  CKTOTAL 46*   BNP (last 3 results) No results for input(s): BNP in the last 8760 hours. CBG: No results for  input(s): GLUCAP in the last 168 hours. Time spent: 35 minutes  Signed:  Berle Mull  [facility] 12/29/2016 , 6:02 PM

## 2016-12-30 NOTE — Telephone Encounter (Signed)
Pt called to report that since last night he has not been able to urinate. Advised pt that given his medical history, recent labs and recent T-spine fx he needs to be seen in ED and evaluated as this could be a result of several things, none of which we can treat in the office. Pt agrees to call EMS for transport to ED.

## 2016-12-30 NOTE — ED Triage Notes (Signed)
Per EMS: pt had fall and diagnosed with fracture in back; pt having pain today and unable to urinate; pt has not gotten any rx filled

## 2016-12-31 ENCOUNTER — Encounter (HOSPITAL_COMMUNITY): Payer: Self-pay | Admitting: Emergency Medicine

## 2016-12-31 ENCOUNTER — Telehealth: Payer: Self-pay | Admitting: *Deleted

## 2016-12-31 ENCOUNTER — Emergency Department (HOSPITAL_COMMUNITY): Payer: Medicare Other

## 2016-12-31 ENCOUNTER — Inpatient Hospital Stay (HOSPITAL_COMMUNITY)
Admission: EM | Admit: 2016-12-31 | Discharge: 2017-01-05 | DRG: 641 | Disposition: A | Discharge status: Skilled Nursing Facility | Payer: Medicare Other | Attending: Internal Medicine | Admitting: Internal Medicine

## 2016-12-31 DIAGNOSIS — N400 Enlarged prostate without lower urinary tract symptoms: Secondary | ICD-10-CM | POA: Diagnosis present

## 2016-12-31 DIAGNOSIS — S12100A Unspecified displaced fracture of second cervical vertebra, initial encounter for closed fracture: Secondary | ICD-10-CM | POA: Diagnosis not present

## 2016-12-31 DIAGNOSIS — K219 Gastro-esophageal reflux disease without esophagitis: Secondary | ICD-10-CM | POA: Diagnosis present

## 2016-12-31 DIAGNOSIS — W19XXXA Unspecified fall, initial encounter: Secondary | ICD-10-CM

## 2016-12-31 DIAGNOSIS — F410 Panic disorder [episodic paroxysmal anxiety] without agoraphobia: Secondary | ICD-10-CM | POA: Diagnosis present

## 2016-12-31 DIAGNOSIS — N183 Chronic kidney disease, stage 3 unspecified: Secondary | ICD-10-CM | POA: Diagnosis present

## 2016-12-31 DIAGNOSIS — E785 Hyperlipidemia, unspecified: Secondary | ICD-10-CM | POA: Diagnosis present

## 2016-12-31 DIAGNOSIS — M549 Dorsalgia, unspecified: Secondary | ICD-10-CM | POA: Diagnosis not present

## 2016-12-31 DIAGNOSIS — F32A Depression, unspecified: Secondary | ICD-10-CM | POA: Diagnosis present

## 2016-12-31 DIAGNOSIS — D696 Thrombocytopenia, unspecified: Secondary | ICD-10-CM | POA: Diagnosis present

## 2016-12-31 DIAGNOSIS — N390 Urinary tract infection, site not specified: Secondary | ICD-10-CM | POA: Diagnosis not present

## 2016-12-31 DIAGNOSIS — R488 Other symbolic dysfunctions: Secondary | ICD-10-CM | POA: Diagnosis not present

## 2016-12-31 DIAGNOSIS — R627 Adult failure to thrive: Secondary | ICD-10-CM | POA: Diagnosis present

## 2016-12-31 DIAGNOSIS — D649 Anemia, unspecified: Secondary | ICD-10-CM

## 2016-12-31 DIAGNOSIS — E871 Hypo-osmolality and hyponatremia: Secondary | ICD-10-CM | POA: Diagnosis not present

## 2016-12-31 DIAGNOSIS — I129 Hypertensive chronic kidney disease with stage 1 through stage 4 chronic kidney disease, or unspecified chronic kidney disease: Secondary | ICD-10-CM | POA: Diagnosis present

## 2016-12-31 DIAGNOSIS — F3341 Major depressive disorder, recurrent, in partial remission: Secondary | ICD-10-CM

## 2016-12-31 DIAGNOSIS — K5909 Other constipation: Secondary | ICD-10-CM | POA: Diagnosis present

## 2016-12-31 DIAGNOSIS — S22072A Unstable burst fracture of T9-T10 vertebra, initial encounter for closed fracture: Secondary | ICD-10-CM

## 2016-12-31 DIAGNOSIS — Z7401 Bed confinement status: Secondary | ICD-10-CM | POA: Diagnosis not present

## 2016-12-31 DIAGNOSIS — R0781 Pleurodynia: Secondary | ICD-10-CM | POA: Diagnosis not present

## 2016-12-31 DIAGNOSIS — S22079D Unspecified fracture of T9-T10 vertebra, subsequent encounter for fracture with routine healing: Secondary | ICD-10-CM | POA: Diagnosis not present

## 2016-12-31 DIAGNOSIS — R110 Nausea: Secondary | ICD-10-CM | POA: Diagnosis not present

## 2016-12-31 DIAGNOSIS — E861 Hypovolemia: Secondary | ICD-10-CM | POA: Diagnosis present

## 2016-12-31 DIAGNOSIS — J449 Chronic obstructive pulmonary disease, unspecified: Secondary | ICD-10-CM | POA: Diagnosis present

## 2016-12-31 DIAGNOSIS — R2681 Unsteadiness on feet: Secondary | ICD-10-CM | POA: Diagnosis not present

## 2016-12-31 DIAGNOSIS — R262 Difficulty in walking, not elsewhere classified: Secondary | ICD-10-CM | POA: Diagnosis not present

## 2016-12-31 DIAGNOSIS — Z833 Family history of diabetes mellitus: Secondary | ICD-10-CM | POA: Diagnosis not present

## 2016-12-31 DIAGNOSIS — K5900 Constipation, unspecified: Secondary | ICD-10-CM | POA: Diagnosis not present

## 2016-12-31 DIAGNOSIS — Z79899 Other long term (current) drug therapy: Secondary | ICD-10-CM

## 2016-12-31 DIAGNOSIS — R030 Elevated blood-pressure reading, without diagnosis of hypertension: Secondary | ICD-10-CM | POA: Diagnosis not present

## 2016-12-31 DIAGNOSIS — E876 Hypokalemia: Secondary | ICD-10-CM

## 2016-12-31 DIAGNOSIS — M546 Pain in thoracic spine: Secondary | ICD-10-CM | POA: Diagnosis not present

## 2016-12-31 DIAGNOSIS — I10 Essential (primary) hypertension: Secondary | ICD-10-CM | POA: Diagnosis not present

## 2016-12-31 DIAGNOSIS — Z9181 History of falling: Secondary | ICD-10-CM | POA: Diagnosis not present

## 2016-12-31 DIAGNOSIS — M6281 Muscle weakness (generalized): Secondary | ICD-10-CM | POA: Diagnosis not present

## 2016-12-31 DIAGNOSIS — R6251 Failure to thrive (child): Secondary | ICD-10-CM | POA: Diagnosis not present

## 2016-12-31 DIAGNOSIS — Z811 Family history of alcohol abuse and dependence: Secondary | ICD-10-CM | POA: Diagnosis not present

## 2016-12-31 DIAGNOSIS — F329 Major depressive disorder, single episode, unspecified: Secondary | ICD-10-CM | POA: Diagnosis present

## 2016-12-31 DIAGNOSIS — E860 Dehydration: Secondary | ICD-10-CM | POA: Diagnosis present

## 2016-12-31 LAB — BASIC METABOLIC PANEL
ANION GAP: 12 (ref 5–15)
BUN: 21 mg/dL (ref 4–21)
BUN: 24 mg/dL — AB (ref 4–21)
BUN: 24 mg/dL — AB (ref 6–20)
CALCIUM: 8.8 mg/dL — AB (ref 8.9–10.3)
CO2: 20 mmol/L — AB (ref 22–32)
CREATININE: 1.4 mg/dL — AB (ref 0.6–1.3)
CREATININE: 1.5 mg/dL — AB (ref 0.6–1.3)
CREATININE: 1.51 mg/dL — AB (ref 0.61–1.24)
Chloride: 88 mmol/L — ABNORMAL LOW (ref 101–111)
GFR calc Af Amer: 47 mL/min — ABNORMAL LOW (ref >60)
GFR, EST NON AFRICAN AMERICAN: 40 mL/min — AB (ref >60)
GLUCOSE: 96 mg/dL
GLUCOSE: 97 mg/dL (ref 65–99)
Glucose: 97 mg/dL
POTASSIUM: 3.3 mmol/L — AB (ref 3.4–5.3)
Potassium: 3.3 mmol/L — ABNORMAL LOW (ref 3.5–5.1)
Potassium: 3.4 mmol/L (ref 3.4–5.3)
SODIUM: 121 mmol/L — AB (ref 137–147)
Sodium: 120 mmol/L — AB (ref 137–147)
Sodium: 120 mmol/L — ABNORMAL LOW (ref 135–145)

## 2016-12-31 LAB — URINALYSIS, ROUTINE W REFLEX MICROSCOPIC
BILIRUBIN URINE: NEGATIVE
GLUCOSE, UA: NEGATIVE mg/dL
HGB URINE DIPSTICK: NEGATIVE
Ketones, ur: 5 mg/dL — AB
NITRITE: NEGATIVE
Protein, ur: NEGATIVE mg/dL
SPECIFIC GRAVITY, URINE: 1.016 (ref 1.005–1.030)
pH: 6 (ref 5.0–8.0)

## 2016-12-31 LAB — CBC
HEMATOCRIT: 25.7 % — AB (ref 39.0–52.0)
Hemoglobin: 9.4 g/dL — ABNORMAL LOW (ref 13.0–17.0)
MCH: 34.1 pg — AB (ref 26.0–34.0)
MCHC: 36.6 g/dL — AB (ref 30.0–36.0)
MCV: 93.1 fL (ref 78.0–100.0)
PLATELETS: 122 10*3/uL — AB (ref 150–400)
RBC: 2.76 MIL/uL — ABNORMAL LOW (ref 4.22–5.81)
RDW: 13.9 % (ref 11.5–15.5)
WBC: 5.7 10*3/uL (ref 4.0–10.5)

## 2016-12-31 LAB — CBC AND DIFFERENTIAL
HCT: 26 % — AB (ref 41–53)
HEMOGLOBIN: 9.4 g/dL — AB (ref 13.5–17.5)
PLATELETS: 122 10*3/uL — AB (ref 150–399)
WBC: 5.7 10*3/mL

## 2016-12-31 LAB — I-STAT CG4 LACTIC ACID, ED: Lactic Acid, Venous: 0.87 mmol/L (ref 0.5–1.9)

## 2016-12-31 MED ORDER — MORPHINE SULFATE (PF) 2 MG/ML IV SOLN
1.0000 mg | INTRAVENOUS | Status: DC | PRN
  Administered 2017-01-01 – 2017-01-02 (×2): 2 mg via INTRAVENOUS
  Filled 2016-12-31 (×2): qty 1

## 2016-12-31 MED ORDER — ADULT MULTIVITAMIN W/MINERALS CH
1.0000 | ORAL_TABLET | Freq: Every day | ORAL | Status: DC
Start: 2017-01-01 — End: 2017-01-05
  Administered 2017-01-01 – 2017-01-05 (×5): 1 via ORAL
  Filled 2016-12-31 (×5): qty 1

## 2016-12-31 MED ORDER — HYDROCODONE-ACETAMINOPHEN 5-325 MG PO TABS
1.0000 | ORAL_TABLET | ORAL | Status: DC | PRN
  Administered 2016-12-31 – 2017-01-02 (×5): 1 via ORAL
  Filled 2016-12-31 (×5): qty 1

## 2016-12-31 MED ORDER — MORPHINE SULFATE (PF) 4 MG/ML IV SOLN
4.0000 mg | Freq: Once | INTRAVENOUS | Status: AC
  Administered 2016-12-31: 4 mg via INTRAVENOUS
  Filled 2016-12-31: qty 1

## 2016-12-31 MED ORDER — ALBUTEROL SULFATE (2.5 MG/3ML) 0.083% IN NEBU: 2.5000 mg | INHALATION_SOLUTION | Freq: Four times a day (QID) | RESPIRATORY_TRACT | Status: DC | PRN

## 2016-12-31 MED ORDER — BUSPIRONE HCL 10 MG PO TABS
30.0000 mg | ORAL_TABLET | Freq: Every day | ORAL | Status: DC
  Administered 2017-01-01 – 2017-01-04 (×4): 30 mg via ORAL
  Filled 2016-12-31 (×5): qty 3

## 2016-12-31 MED ORDER — MAGNESIUM SULFATE IN D5W 1-5 GM/100ML-% IV SOLN
1.0000 g | Freq: Once | INTRAVENOUS | Status: AC
  Administered 2016-12-31: 1 g via INTRAVENOUS
  Filled 2016-12-31: qty 100

## 2016-12-31 MED ORDER — POLYETHYLENE GLYCOL 3350 17 G PO PACK
17.0000 g | PACK | Freq: Every day | ORAL | Status: DC
  Administered 2017-01-03: 17 g via ORAL
  Filled 2016-12-31 (×5): qty 1

## 2016-12-31 MED ORDER — SODIUM CHLORIDE 0.9 % IV BOLUS (SEPSIS)
1000.0000 mL | Freq: Once | INTRAVENOUS | Status: AC
  Administered 2016-12-31: 1000 mL via INTRAVENOUS

## 2016-12-31 MED ORDER — ONDANSETRON HCL 4 MG/2ML IJ SOLN
4.0000 mg | Freq: Once | INTRAMUSCULAR | Status: AC
  Administered 2016-12-31: 4 mg via INTRAVENOUS
  Filled 2016-12-31: qty 2

## 2016-12-31 MED ORDER — POTASSIUM CHLORIDE IN NACL 20-0.9 MEQ/L-% IV SOLN
INTRAVENOUS | Status: AC
  Administered 2016-12-31: 22:00:00 via INTRAVENOUS
  Filled 2016-12-31: qty 1000

## 2016-12-31 MED ORDER — ONDANSETRON HCL 4 MG PO TABS: 4.0000 mg | ORAL_TABLET | Freq: Four times a day (QID) | ORAL | Status: DC | PRN

## 2016-12-31 MED ORDER — CLONIDINE HCL 0.1 MG PO TABS
0.1000 mg | ORAL_TABLET | Freq: Every day | ORAL | Status: DC
  Administered 2017-01-01 – 2017-01-05 (×5): 0.1 mg via ORAL
  Filled 2016-12-31 (×5): qty 1

## 2016-12-31 MED ORDER — HEPARIN SODIUM (PORCINE) 5000 UNIT/ML IJ SOLN
5000.0000 [IU] | Freq: Three times a day (TID) | INTRAMUSCULAR | Status: DC
  Administered 2017-01-01 – 2017-01-05 (×11): 5000 [IU] via SUBCUTANEOUS
  Filled 2016-12-31 (×11): qty 1

## 2016-12-31 MED ORDER — PANTOPRAZOLE SODIUM 40 MG PO TBEC
40.0000 mg | DELAYED_RELEASE_TABLET | Freq: Every day | ORAL | Status: DC
  Administered 2017-01-01 – 2017-01-05 (×5): 40 mg via ORAL
  Filled 2016-12-31 (×5): qty 1

## 2016-12-31 MED ORDER — POTASSIUM CHLORIDE CRYS ER 20 MEQ PO TBCR
20.0000 meq | EXTENDED_RELEASE_TABLET | Freq: Once | ORAL | Status: AC
  Administered 2016-12-31: 20 meq via ORAL
  Filled 2016-12-31: qty 1

## 2016-12-31 MED ORDER — SODIUM CHLORIDE 0.9% FLUSH
3.0000 mL | Freq: Two times a day (BID) | INTRAVENOUS | Status: DC
  Administered 2017-01-01 – 2017-01-05 (×8): 3 mL via INTRAVENOUS

## 2016-12-31 MED ORDER — ONDANSETRON HCL 4 MG/2ML IJ SOLN: 4.0000 mg | Freq: Four times a day (QID) | INTRAMUSCULAR | Status: DC | PRN

## 2016-12-31 MED ORDER — DOCUSATE SODIUM 100 MG PO CAPS
100.0000 mg | ORAL_CAPSULE | Freq: Two times a day (BID) | ORAL | Status: DC | PRN
Start: 2016-12-31 — End: 2017-01-01
  Administered 2017-01-01: 100 mg via ORAL
  Filled 2016-12-31: qty 1

## 2016-12-31 MED ORDER — SIMVASTATIN 10 MG PO TABS
10.0000 mg | ORAL_TABLET | Freq: Every day | ORAL | Status: DC
  Administered 2016-12-31 – 2017-01-04 (×5): 10 mg via ORAL
  Filled 2016-12-31 (×5): qty 1

## 2016-12-31 MED ORDER — DEXTROSE 5 % IV SOLN
1.0000 g | INTRAVENOUS | Status: DC
  Administered 2017-01-01 – 2017-01-02 (×2): 1 g via INTRAVENOUS
  Filled 2016-12-31 (×3): qty 10

## 2016-12-31 MED ORDER — FLUOXETINE HCL 20 MG PO CAPS
40.0000 mg | ORAL_CAPSULE | Freq: Every day | ORAL | Status: DC
  Administered 2017-01-01 – 2017-01-05 (×5): 40 mg via ORAL
  Filled 2016-12-31 (×5): qty 2

## 2016-12-31 MED ORDER — MIRTAZAPINE 15 MG PO TABS
45.0000 mg | ORAL_TABLET | Freq: Every day | ORAL | Status: DC
  Administered 2016-12-31 – 2017-01-04 (×5): 45 mg via ORAL
  Filled 2016-12-31 (×6): qty 3

## 2016-12-31 MED ORDER — DEXTROSE 5 % IV SOLN
1.0000 g | Freq: Once | INTRAVENOUS | Status: AC
  Administered 2016-12-31: 1 g via INTRAVENOUS
  Filled 2016-12-31: qty 10

## 2016-12-31 MED ORDER — TAMSULOSIN HCL 0.4 MG PO CAPS
0.4000 mg | ORAL_CAPSULE | Freq: Every day | ORAL | Status: DC
  Administered 2017-01-01 – 2017-01-05 (×5): 0.4 mg via ORAL
  Filled 2016-12-31 (×5): qty 1

## 2016-12-31 MED ORDER — ZOLPIDEM TARTRATE 5 MG PO TABS
5.0000 mg | ORAL_TABLET | Freq: Every evening | ORAL | Status: DC | PRN
  Administered 2016-12-31 – 2017-01-01 (×2): 5 mg via ORAL
  Filled 2016-12-31 (×2): qty 1

## 2016-12-31 NOTE — Telephone Encounter (Signed)
Transition Care Management Follow-up Telephone Call  Patient discharged from hospital on 12/29/16   How have you been since you were released from the hospital? Patient answered phone and stated "I am so sick and don't feel good, I'm on my way back to the ER, I can't get up on my own, I can't feed myself, I'm having trouble with my urine." Patient does report that home health was initiated and they recommended patient to go to ER as well.    Do you understand why you were in the hospital? Yes.    Do you understand the discharge instrcutions? Unable to assess d/t patient returning to ER for eval  Items Reviewed:  Medications reviewed: no  Allergies reviewed: yes  Dietary changes reviewed: no  Referrals reviewed: no   Functional Questionnaire:   Activities of Daily Living (ADLs):   He states they are independent in the following: none; patient reports needing assistance in all areas States they require assistance with the following: ambulation, bathing and hygiene, feeding, continence, grooming, toileting and dressing   Any transportation issues/concerns?: no   Any patient concerns? yes   Confirmed importance and date/time of follow-up visits scheduled: unable to discuss d/t patient going to ER for re-eval  Confirmed with patient if condition begins to worsen call PCP or go to the ER.  Patient was given the Call-a-Nurse line [phone]: patient going to ER after phone call

## 2016-12-31 NOTE — ED Notes (Signed)
Patient transported to X-ray 

## 2016-12-31 NOTE — ED Provider Notes (Signed)
[facility] DEPT Provider Note   CSN: [id number] Arrival date & time: 12/31/16  1739     History   Chief Complaint Chief Complaint  Patient presents with  . Back Pain  . Fall    HPI GIL INGWERSEN III is a 83 y.o. male.  HPI 83 year old male with past medical history as below including recent T9 fracture who presents with failure to thrive. The patient was recently seen on April 2 following thoracic spine fracture with kidney injury. He was discharged on the third. He was seen again in the ED yesterday due to worsening pain and nausea. His symptoms improved and he was sent home. Since then, the patient states he has not been able to eat or drink. He has had progressively worsening generalized nausea as well as back pain, as he has been unable to take his pain medications. Over the last 24 hours, he has noticed significantly decreased urine output. He has not been able to move around his house. He has eaten only a very small amount over the last 24 hours. His pain is aching, throbbing, and similar to his pain since his injury. Denies any new numbness or weakness in the lower extremities. He also has not had a bowel movement in the last week, although this is not particularly uncommon for him. No vomiting.  Past Medical History:  Diagnosis Date  . BPH (benign prostatic hyperplasia)   . Broken rib   . COPD (chronic obstructive pulmonary disease) ([place])   . GERD (gastroesophageal reflux disease)   . GERD (gastroesophageal reflux disease)   . Hyperlipidemia   . Hypertension   . Insomnia   . Panic attack     Patient Active Problem List   Diagnosis Date Noted  . Hyponatremia 12/31/2016  . Hypokalemia 12/31/2016  . Normocytic anemia 12/31/2016  . Acute lower UTI 12/31/2016  . Fall 12/29/2016  . Thoracic spine fracture ([place]) 12/29/2016  . Back pain 12/29/2016  . CKD (chronic kidney disease), stage III 12/29/2016  . Depression 06/03/2016  . History of fractured rib 11/28/2015   . History of peripheral edema 11/28/2015  . Seborrheic keratosis, inflamed 08/13/2015  . Actinic keratosis 08/13/2015  . Fatigue 07/31/2015  . Essential hypertension, benign 07/31/2015  . Urinary frequency 08/16/2009  . URTICARIA 11/13/2007  . Hyperlipidemia 06/08/2007  . GERD 06/08/2007  . PANIC ATTACK 05/07/2007  . COPD 05/07/2007  . INSOMNIA 05/07/2007    Past Surgical History:  Procedure Laterality Date  . CHOLECYSTECTOMY         Home Medications    Prior to Admission medications   Medication Sig Start Date End Date Taking? Authorizing Provider  albuterol (PROVENTIL HFA;VENTOLIN HFA) 108 (90 Base) MCG/ACT inhaler Inhale 2 puffs into the lungs every 6 (six) hours as needed. Patient taking differently: Inhale 2 puffs into the lungs every 6 (six) hours as needed for wheezing or shortness of breath.  07/28/16  Yes Lucretia Kern, DO  busPIRone (BUSPAR) 30 MG tablet TAKE 1 TABLET DAILY Patient taking differently: Take 30 mg by mouth once a day 07/29/16  Yes Dorena Cookey, MD  cloNIDine (CATAPRES) 0.1 MG tablet TAKE 1 TABLET DAILY Patient taking differently: Take 0.1 mg by mouth once a day 07/29/16  Yes Dorena Cookey, MD  FLUoxetine (PROZAC) 40 MG capsule Take 1 capsule (40 mg total) by mouth daily. 08/29/16  Yes [name], NP  HYDROcodone-acetaminophen (NORCO/VICODIN) 5-325 MG tablet Take 1 tablet by mouth every 4 (four) hours as needed  for moderate pain. 12/29/16  Yes Lavina Hamman, MD  mirtazapine (REMERON) 45 MG tablet Take 1 tablet (45 mg total) by mouth at bedtime. 09/26/16  Yes [name], NP  Multiple Vitamin (MULTIVITAMIN) capsule Take 1 capsule by mouth daily.   Yes Historical Provider, MD  omeprazole (PRILOSEC) 20 MG capsule TAKE 1 CAPSULE DAILY Patient taking differently: Take 20 mg by mouth once a day 07/29/16  Yes Dorena Cookey, MD  polyethylene glycol [facility]) packet Take 17 g by mouth daily. 12/29/16  Yes Lavina Hamman, MD  simvastatin (ZOCOR) 10 MG tablet  TAKE 1 TABLET AT BEDTIME Patient taking differently: Take 10 mg by mouth once a day 07/29/16  Yes Dorena Cookey, MD  tamsulosin (FLOMAX) 0.4 MG CAPS capsule TAKE 1 CAPSULE DAILY Patient taking differently: Take 0.4 mg by mouth once a day 07/29/16  Yes Dorena Cookey, MD  docusate sodium (COLACE) 100 MG capsule Take 1 capsule (100 mg total) by mouth 2 (two) times daily as needed for mild constipation. 12/29/16   Lavina Hamman, MD    Family History Family History  Problem Relation Age of Onset  . Emphysema Father   . Alcohol abuse Father   . Diabetes Sister     Social History Social History  Substance Use Topics  . Smoking status: Never Smoker  . Smokeless tobacco: Never Used  . Alcohol use 0.0 oz/week     Comment: Wine with meals      Allergies   Influenza virus vacc split pf; Doxycycline; and Eggs or egg-derived products   Review of Systems Review of Systems  Constitutional: Positive for fatigue.  Gastrointestinal: Positive for abdominal pain and nausea.  Genitourinary: Positive for decreased urine volume.  Musculoskeletal: Positive for back pain.  All other systems reviewed and are negative.    Physical Exam Updated Vital Signs BP (!) 148/83 (BP Location: Left Arm)   Pulse 77   Temp 98.1 F (36.7 C) (Oral)   Resp 16   Ht 5\' 6"  (1.676 m)   Wt 140 lb 4.8 oz (63.6 kg)   SpO2 92%   BMI 22.65 kg/m   Physical Exam  Constitutional: He is oriented to person, place, and time. He appears well-developed and well-nourished. No distress.  HENT:  Head: Normocephalic and atraumatic.  Moderately dry mucous membranes  Eyes: Conjunctivae are normal.  Neck: Neck supple.  Cardiovascular: Normal rate, regular rhythm and normal heart sounds.  Exam reveals no friction rub.   No murmur heard. Pulmonary/Chest: Effort normal and breath sounds normal. No respiratory distress. He has no wheezes. He has no rales.  Abdominal: He exhibits no distension.  Musculoskeletal: He exhibits  tenderness. He exhibits no edema.  Neurological: He is alert and oriented to person, place, and time. He exhibits normal muscle tone.  Skin: Skin is warm. Capillary refill takes less than 2 seconds.  Psychiatric: He has a normal mood and affect.  Nursing note and vitals reviewed.   Spine Exam: Inspection/Palpation: Significant midline tenderness over lower thoracic spine. No deformity. No lumbar tenderness. No paraspinal spasm. Strength: 5/5 throughout LE bilaterally (hip flexion/extension, adduction/abduction; knee flexion/extension; foot dorsiflexion/plantarflexion, inversion/eversion; great toe inversion) Sensation: Intact to light touch in proximal and distal LE bilaterally Reflexes: 2+ quadriceps and achilles reflexes  ED Treatments / Results  Labs (all labs ordered are listed, but only abnormal results are displayed) Labs Reviewed  URINALYSIS, ROUTINE W REFLEX MICROSCOPIC - Abnormal; Notable for the following:  Result Value   APPearance HAZY (*)    Ketones, ur 5 (*)    Leukocytes, UA TRACE (*)    Bacteria, UA MANY (*)    Squamous Epithelial / LPF 0-5 (*)    All other components within normal limits  CBC - Abnormal; Notable for the following:    RBC 2.76 (*)    Hemoglobin 9.4 (*)    HCT 25.7 (*)    MCH 34.1 (*)    MCHC 36.6 (*)    Platelets 122 (*)    All other components within normal limits  BASIC METABOLIC PANEL - Abnormal; Notable for the following:    Sodium 120 (*)    Potassium 3.3 (*)    Chloride 88 (*)    CO2 20 (*)    BUN 24 (*)    Creatinine, Ser 1.51 (*)    Calcium 8.8 (*)    GFR calc non Af Amer 40 (*)    GFR calc Af Amer 47 (*)    All other components within normal limits  URINE CULTURE  OSMOLALITY  VITAMIN B12  FOLATE  IRON AND TIBC  FERRITIN  RETICULOCYTES  BASIC METABOLIC PANEL  TSH  BASIC METABOLIC PANEL  CBC  I-STAT CG4 LACTIC ACID, ED    EKG  EKG Interpretation None       Radiology Dg Abdomen Acute W/chest  Result Date:  12/31/2016 CLINICAL DATA:  83 year old presenting with generalized weakness and constipation. EXAM: DG ABDOMEN ACUTE W/ 1V CHEST COMPARISON:  CT abdomen and pelvis 09/19/2004. Chest x-rays 08/05/2016, 04/14/2016 and earlier. FINDINGS: Gas within normal-caliber loops of small bowel throughout the abdomen and pelvis. Gas and expected stool burden throughout normal caliber colon. No visible opaque urinary tract calculi. Surgical clips in the right upper quadrant from prior cholecystectomy. No evidence of free intraperitoneal air or significant air-fluid levels on the erect image. Aortoiliofemoral atherosclerosis. Lumbar dextroscoliosis. Severe degenerative changes throughout the lumbar spine. Severe osseous demineralization. Cardiac silhouette moderately enlarged for AP technique, increased in size since 2017. Thoracic aorta atherosclerotic, unchanged. Large hiatal hernia, increased in size. Paratracheal opacity in the superior mediastinum is likely related to the combination of thyroid gland enlargement and mediastinal fat better seen on the AP image. Lungs clear. Bronchovascular markings normal. Pulmonary vascularity normal. No visible pleural effusions. No pneumothorax. IMPRESSION: 1. No acute abdominal abnormality. 2. Moderate cardiomegaly, with interval increase in heart size since November, 2017. 3.  No acute cardiopulmonary disease. 4. Large hiatal hernia. Electronically Signed   By: Evangeline Dakin M.D.   On: 12/31/2016 19:31    Procedures Procedures (including critical care time)  Medications Ordered in ED Medications  0.9 % NaCl with KCl 20 mEq/ L  infusion ( Intravenous Transfusing/Transfer 12/31/16 2232)  docusate sodium (COLACE) capsule 100 mg (not administered)  HYDROcodone-acetaminophen (NORCO/VICODIN) 5-325 MG per tablet 1 tablet (1 tablet Oral Given 12/31/16 2351)  polyethylene glycol (MIRALAX / GLYCOLAX) packet 17 g (not administered)  mirtazapine (REMERON) tablet 45 mg (45 mg Oral Given  12/31/16 2352)  FLUoxetine (PROZAC) capsule 40 mg (not administered)  busPIRone (BUSPAR) tablet 30 mg (not administered)  cloNIDine (CATAPRES) tablet 0.1 mg (not administered)  pantoprazole (PROTONIX) EC tablet 40 mg (not administered)  simvastatin (ZOCOR) tablet 10 mg (10 mg Oral Given 12/31/16 2352)  tamsulosin (FLOMAX) capsule 0.4 mg (not administered)  albuterol (PROVENTIL) (2.5 MG/3ML) 0.083% nebulizer solution 2.5 mg (not administered)  multivitamin with minerals tablet 1 tablet (not administered)  heparin injection 5,000 Units (not administered)  sodium chloride flush (NS) 0.9 % injection 3 mL (not administered)  ondansetron (ZOFRAN) tablet 4 mg (not administered)    Or  ondansetron (ZOFRAN) injection 4 mg (not administered)  morphine 2 MG/ML injection 1-3 mg (not administered)  zolpidem (AMBIEN) tablet 5 mg (5 mg Oral Given 12/31/16 2352)  cefTRIAXone (ROCEPHIN) 1 g in dextrose 5 % 50 mL IVPB (not administered)  ondansetron (ZOFRAN) injection 4 mg (4 mg Intravenous Given 12/31/16 1845)  sodium chloride 0.9 % bolus 1,000 mL (0 mLs Intravenous Stopped 12/31/16 1946)  cefTRIAXone (ROCEPHIN) 1 g in dextrose 5 % 50 mL IVPB (0 g Intravenous Stopped 12/31/16 2153)  morphine 4 MG/ML injection 4 mg (4 mg Intravenous Given 12/31/16 2057)  ondansetron (ZOFRAN) injection 4 mg (4 mg Intravenous Given 12/31/16 2055)  magnesium sulfate IVPB 1 g 100 mL (1 g Intravenous Transfusing/Transfer 12/31/16 2232)  potassium chloride SA (K-DUR,KLOR-CON) CR tablet 20 mEq (20 mEq Oral Given 12/31/16 2212)     Initial Impression / Assessment and Plan / ED Course  I have reviewed the triage vital signs and the nursing notes.  Pertinent labs & imaging results that were available during my care of the patient were reviewed by me and considered in my medical decision making (see chart for details).    83 year old male with past medical history as above including recent T9 compression fx here with worsening nausea, poor  appetite, and back pain. On arrival, VSS and WNL. Exam is as above. No focal neuro deficits. He appears clinically dehydrated and labs show acute hyponatremia, hypokalemia. UA also c/f UTI with many bacteria, 6-30 WBCs. Suspect failure to thrive 2/2 increasing pain and immobility, now complicated by hypovolemic hyponatremia. Of note, however, pt also reportedly drank "10 glasses" of water right before coming, so may be dilutional 2/2 free water intake. Pt given 1L NS, will monitor. Rocephin for UTI.  Final Clinical Impressions(s) / ED Diagnoses   Final diagnoses:  Lower urinary tract infectious disease  Hyponatremia  Failure to thrive in adult    New Prescriptions Current Discharge Medication List       Duffy Bruce, MD 01/01/17 0004

## 2016-12-31 NOTE — ED Triage Notes (Signed)
Per EMS: pt here after fall last week; pt sts rib pain and back pain; pt has t spine fracture and sts he has had decreased urine output; pt sts some nausea from taking pain pills without eating

## 2016-12-31 NOTE — ED Notes (Signed)
Pt. returned from XR. 

## 2016-12-31 NOTE — H&P (Signed)
History and Physical    KENNEITH STIEF Wolfe YQM:[id number] DOB: 1929/09/22 DOA: 12/31/2016  PCP: [name], NP   Patient coming from: Home  Chief Complaint: Intractable pain in ribs and back, dysuria   HPI: Peter Wolfe is a 83 y.o. male with medical history significant for hypertension, GERD, depression, anxiety, and recent fall with thoracic compression fracture and resulting pain who presents to the emergency department with intractable pain in the back and bilateral ribs with inability to get up from bed or care from himself secondary to this. Patient was admitted briefly on 12/29/2016 after suffering a thoracic compression fracture. Neurosurgery was consulted and advised conservative management and the patient was discharged with pain medications. He was also noted to have an acute kidney injury during that admission and his NSAID and diuretics were discontinued at time of discharge. Since returning home, his pain has been on bearable despite his use of the prescribed Norco. Pain is much worse with any movement and he has essentially been bedbound since returning home. He lives alone and hasn't been able to care for himself secondary to this. He denies any recent fevers or chills and denies chest pain or palpitations. There has been no significant dyspnea or cough. Mild nausea and malaise is noted, but no vomiting or diarrhea. He denies abdominal pain. ROS is positive for dysuria and urinary urgency and frequency. He also denies melena or hematochezia.   ED Course: Upon arrival to the ED, patient is found to be afebrile, saturating well on room air, mildly hypertensive, and with vitals otherwise stable. EKG features a sinus rhythm and chest x-ray is notable for slightly increased cardiomegaly but no acute cardiopulmonary disease. KUB is negative. Chemistry panel features a sodium of 120, potassium 3.3, BUN 24, and serum creatinine 1.51. CBC is notable for a normocytic anemia with  hemoglobin of 9.4 and a thrombocytopenia with platelets 122,000. Urinalysis suggests a possible infection and lactic acid is reassuring at 0.87. Patient was treated with Zofran, 1 L of normal saline, and an empiric dose of Rocephin in the emergency department. He remained hemodynamically stable in the ED and in no apparent respiratory distress and will be observed on the telemetry unit for ongoing evaluation and management hypovolemic hyponatremia.  Review of Systems:  All other systems reviewed and apart from HPI, are negative.  Past Medical History:  Diagnosis Date  . BPH (benign prostatic hyperplasia)   . Broken rib   . COPD (chronic obstructive pulmonary disease) ([place])   . GERD (gastroesophageal reflux disease)   . GERD (gastroesophageal reflux disease)   . Hyperlipidemia   . Hypertension   . Insomnia   . Panic attack     Past Surgical History:  Procedure Laterality Date  . CHOLECYSTECTOMY       reports that he has never smoked. He has never used smokeless tobacco. He reports that he drinks alcohol. He reports that he does not use drugs.  Allergies  Allergen Reactions  . Influenza Virus Vacc Split Pf Other (See Comments)    Allergic to eggs  . Doxycycline Rash  . Eggs Or Egg-Derived Products Rash    Family History  Problem Relation Age of Onset  . Emphysema Father   . Alcohol abuse Father   . Diabetes Sister      Prior to Admission medications   Medication Sig Start Date End Date Taking? Authorizing Provider  albuterol (PROVENTIL HFA;VENTOLIN HFA) 108 (90 Base) MCG/ACT inhaler Inhale 2 puffs into the lungs  every 6 (six) hours as needed. Patient taking differently: Inhale 2 puffs into the lungs every 6 (six) hours as needed for wheezing or shortness of breath.  07/28/16  Yes Lucretia Kern, DO  busPIRone (BUSPAR) 30 MG tablet TAKE 1 TABLET DAILY Patient taking differently: Take 30 mg by mouth once a day 07/29/16  Yes Dorena Cookey, MD  cloNIDine (CATAPRES) 0.1 MG  tablet TAKE 1 TABLET DAILY Patient taking differently: Take 0.1 mg by mouth once a day 07/29/16  Yes Dorena Cookey, MD  FLUoxetine (PROZAC) 40 MG capsule Take 1 capsule (40 mg total) by mouth daily. 08/29/16  Yes [name], NP  HYDROcodone-acetaminophen (NORCO/VICODIN) 5-325 MG tablet Take 1 tablet by mouth every 4 (four) hours as needed for moderate pain. 12/29/16  Yes Lavina Hamman, MD  mirtazapine (REMERON) 45 MG tablet Take 1 tablet (45 mg total) by mouth at bedtime. 09/26/16  Yes [name], NP  Multiple Vitamin (MULTIVITAMIN) capsule Take 1 capsule by mouth daily.   Yes Historical Provider, MD  omeprazole (PRILOSEC) 20 MG capsule TAKE 1 CAPSULE DAILY Patient taking differently: Take 20 mg by mouth once a day 07/29/16  Yes Dorena Cookey, MD  polyethylene glycol [facility]) packet Take 17 g by mouth daily. 12/29/16  Yes Lavina Hamman, MD  simvastatin (ZOCOR) 10 MG tablet TAKE 1 TABLET AT BEDTIME Patient taking differently: Take 10 mg by mouth once a day 07/29/16  Yes Dorena Cookey, MD  tamsulosin (FLOMAX) 0.4 MG CAPS capsule TAKE 1 CAPSULE DAILY Patient taking differently: Take 0.4 mg by mouth once a day 07/29/16  Yes Dorena Cookey, MD  docusate sodium (COLACE) 100 MG capsule Take 1 capsule (100 mg total) by mouth 2 (two) times daily as needed for mild constipation. 12/29/16   Lavina Hamman, MD    Physical Exam: Vitals:   12/31/16 1930 12/31/16 2000 12/31/16 2130 12/31/16 2200  BP: (!) 172/96 (!) 171/91 (!) 154/81 130/76  Pulse: 82 95 80 77  Resp: 11 16 11 13   Temp:      TempSrc:      SpO2: 96% 96% (!) 3% 97%      Constitutional: No acute distress. Appears uncomfortable, disheveled.  Eyes: PERTLA, lids and conjunctivae normal ENMT: Mucous membranes are dry. Posterior pharynx clear of any exudate or lesions.   Neck: normal, supple, no masses, no thyromegaly Respiratory: clear to auscultation bilaterally, no wheezing, no crackles. Normal respiratory effort.   Cardiovascular:  S1 & S2 heard, regular rate and rhythm. No extremity edema. 2+ pedal pulses. No carotid bruits.   Abdomen: No distension, no tenderness, no masses palpated. Bowel sounds normal.  Musculoskeletal: no clubbing / cyanosis. No joint deformity upper and lower extremities.    Skin: no significant rashes, lesions, ulcers. Warm, dry, well-perfused. Poor turgor.  Neurologic: CN 2-12 grossly intact. Sensation intact, DTR normal.    Psychiatric:  Alert and oriented x 3. Normal mood and affect.     Labs on Admission: I have personally reviewed following labs and imaging studies  CBC:  Recent Labs Lab 12/29/16 0111 12/29/16 0827 12/31/16 1837  WBC 8.1 8.1 5.7  NEUTROABS 5.1  --   --   HGB 10.5* 10.0* 9.4*  HCT 29.7* 28.0* 25.7*  MCV 97.1 94.9 93.1  PLT 126* 121* 099*   Basic Metabolic Panel:  Recent Labs Lab 12/29/16 0111 12/29/16 0827 12/31/16 1837  NA 131* 130* 120*  K 3.6 3.6 3.3*  CL 98* 98* 88*  CO2 22 24 20*  GLUCOSE 106* 104* 97  BUN 30* 29* 24*  CREATININE 1.90* 1.86* 1.51*  CALCIUM 9.4 9.3 8.8*   GFR: Estimated Creatinine Clearance: 32.1 mL/min (A) (by C-G formula based on SCr of 1.51 mg/dL (H)). Liver Function Tests:  Recent Labs Lab 12/29/16 0827  AST 19  ALT 17  ALKPHOS 77  BILITOT 1.2  PROT 9.6*  ALBUMIN 3.3*   No results for input(s): LIPASE, AMYLASE in the last 168 hours. No results for input(s): AMMONIA in the last 168 hours. Coagulation Profile: No results for input(s): INR, PROTIME in the last 168 hours. Cardiac Enzymes:  Recent Labs Lab 12/29/16 0827  CKTOTAL 46*   BNP (last 3 results) No results for input(s): PROBNP in the last 8760 hours. HbA1C: No results for input(s): HGBA1C in the last 72 hours. CBG: No results for input(s): GLUCAP in the last 168 hours. Lipid Profile: No results for input(s): CHOL, HDL, LDLCALC, TRIG, CHOLHDL, LDLDIRECT in the last 72 hours. Thyroid Function Tests:  Recent Labs  12/29/16 0827  TSH 3.469    Anemia Panel: No results for input(s): VITAMINB12, FOLATE, FERRITIN, TIBC, IRON, RETICCTPCT in the last 72 hours. Urine analysis:    Component Value Date/Time   COLORURINE YELLOW 12/31/2016 1837   APPEARANCEUR HAZY (A) 12/31/2016 1837   LABSPEC 1.016 12/31/2016 1837   PHURINE 6.0 12/31/2016 1837   GLUCOSEU NEGATIVE 12/31/2016 1837   HGBUR NEGATIVE 12/31/2016 1837   HGBUR negative 06/13/2010 0000   BILIRUBINUR NEGATIVE 12/31/2016 1837   BILIRUBINUR n 07/31/2015 1224   KETONESUR 5 (A) 12/31/2016 1837   PROTEINUR NEGATIVE 12/31/2016 1837   UROBILINOGEN 1.0 07/31/2015 1224   UROBILINOGEN 2.0 06/13/2010 0000   NITRITE NEGATIVE 12/31/2016 1837   LEUKOCYTESUR TRACE (A) 12/31/2016 1837   Sepsis Labs: @LABRCNTIP (procalcitonin:4,lacticidven:4) )No results found for this or any previous visit (from the past 240 hour(s)).   Radiological Exams on Admission: Dg Abdomen Acute W/chest  Result Date: 12/31/2016 CLINICAL DATA:  83 year old presenting with generalized weakness and constipation. EXAM: DG ABDOMEN ACUTE W/ 1V CHEST COMPARISON:  CT abdomen and pelvis 09/19/2004. Chest x-rays 08/05/2016, 04/14/2016 and earlier. FINDINGS: Gas within normal-caliber loops of small bowel throughout the abdomen and pelvis. Gas and expected stool burden throughout normal caliber colon. No visible opaque urinary tract calculi. Surgical clips in the right upper quadrant from prior cholecystectomy. No evidence of free intraperitoneal air or significant air-fluid levels on the erect image. Aortoiliofemoral atherosclerosis. Lumbar dextroscoliosis. Severe degenerative changes throughout the lumbar spine. Severe osseous demineralization. Cardiac silhouette moderately enlarged for AP technique, increased in size since 2017. Thoracic aorta atherosclerotic, unchanged. Large hiatal hernia, increased in size. Paratracheal opacity in the superior mediastinum is likely related to the combination of thyroid gland enlargement and  mediastinal fat better seen on the AP image. Lungs clear. Bronchovascular markings normal. Pulmonary vascularity normal. No visible pleural effusions. No pneumothorax. IMPRESSION: 1. No acute abdominal abnormality. 2. Moderate cardiomegaly, with interval increase in heart size since November, 2017. 3.  No acute cardiopulmonary disease. 4. Large hiatal hernia. Electronically Signed   By: Evangeline Dakin M.D.   On: 12/31/2016 19:31    EKG: Independently reviewed. Sinus rhythm.   Assessment/Plan  1. Hyponatremia, hypovolemic   - Pt presents with pain and generalized weakness, unable to get up from bed   - He reports feeling dehydrated, so was drinking a lot of water on day of presentation - Noted to dry oral mucosa and poor skin turgor, flat neck veins  -  Serum sodium 120; had been 130 two days prior  - Secondary to hypovolemia, free-water intake  - Check osmolality  - He was given 1 liter of NS in ED and will be continued on NS infusion  - Plan to repeat a chem panel overnight and adjust IVF prn   2. UTI  - Pt complains of dysuria and urinary urgency/frequency  - UA is compatible with infection in this setting; pt does not appear toxic  - Urine is sent for culture and empiric Rocephin started    3. Back pain  - Secondary to recent fall with T9 body fracture  - NSG had been consulted at time of recent injury and advised conservative mgmt  - Continue prn analgesia; PT requested    4. Normocytic anemia, thrombocytopenia  - Hgb is 9.4 on admission, down from 10 two days prior  - Platelets stable at 121k  - No evidence for bleeding, will monitor    5. Depression  - Worse recently given the injury and loss of mobility, but denies SI, HI, or hallucinations - Continue Buspar, Remeron, Prozac   6. Hypokalemia  - Serum potassium is 3.3 on admission; no EKG change evident  - He was given 20 mEq oral potassium and had 20 mEq added to IVF  - Chem panel to be redrawn overnight and IVF  adjusted prn    DVT prophylaxis: sq heparin Code Status: Full  Family Communication: Discussed with patient Disposition Plan: Admit to telemetry Consults called: None Admission status: Inpatient    Vianne Bulls, MD [facility] Pager [phone]  If 7PM-7AM, please contact night-coverage [email or web address] Password [facility]  12/31/2016, 10:56 PM

## 2016-12-31 NOTE — Progress Notes (Signed)
Pharmacy Antibiotic Note  Peter Wolfe is a 83 y.o. male admitted on 12/31/2016 with UTI.  Pharmacy has been consulted for Rocephin dosing.  Plan: Rocephin 1gm IV q24h Pharmacy will sign off - please reconsult if needed  Height: 5\' 6"  (167.6 cm) Weight: 140 lb 4.8 oz (63.6 kg) IBW/kg (Calculated) : 63.8  Temp (24hrs), Avg:98.3 F (36.8 C), Min:98.1 F (36.7 C), Max:98.5 F (36.9 C)   Recent Labs Lab 12/29/16 0111 12/29/16 0827 12/31/16 1837 12/31/16 1857  WBC 8.1 8.1 5.7  --   CREATININE 1.90* 1.86* 1.51*  --   LATICACIDVEN  --   --   --  0.87    Estimated Creatinine Clearance: 32.2 mL/min (A) (by C-G formula based on SCr of 1.51 mg/dL (H)).    Allergies  Allergen Reactions  . Influenza Virus Vacc Split Pf Other (See Comments)    Allergic to eggs  . Doxycycline Rash  . Eggs Or Egg-Derived Products Rash    Thank you for allowing pharmacy to be a part of this patient's care.  Sherlon Handing, PharmD, BCPS Clinical pharmacist, pager [phone] 12/31/2016 11:56 PM

## 2017-01-01 LAB — BASIC METABOLIC PANEL
ANION GAP: 10 (ref 5–15)
Anion gap: 8 (ref 5–15)
BUN: 19 mg/dL (ref 4–21)
BUN: 19 mg/dL (ref 6–20)
BUN: 21 mg/dL — ABNORMAL HIGH (ref 6–20)
CALCIUM: 8.3 mg/dL — AB (ref 8.9–10.3)
CHLORIDE: 94 mmol/L — AB (ref 101–111)
CO2: 22 mmol/L (ref 22–32)
CO2: 25 mmol/L (ref 22–32)
CREATININE: 1.4 mg/dL — AB (ref 0.6–1.3)
Calcium: 8.7 mg/dL — ABNORMAL LOW (ref 8.9–10.3)
Chloride: 89 mmol/L — ABNORMAL LOW (ref 101–111)
Creatinine, Ser: 1.4 mg/dL — ABNORMAL HIGH (ref 0.61–1.24)
Creatinine, Ser: 1.38 mg/dL — ABNORMAL HIGH (ref 0.61–1.24)
GFR calc non Af Amer: 45 mL/min — ABNORMAL LOW (ref >60)
GFR, EST AFRICAN AMERICAN: 51 mL/min — AB (ref >60)
GFR, EST AFRICAN AMERICAN: 52 mL/min — AB (ref >60)
GFR, EST NON AFRICAN AMERICAN: 44 mL/min — AB (ref >60)
GLUCOSE: 96 mg/dL
Glucose, Bld: 96 mg/dL (ref 65–99)
Glucose, Bld: 74 mg/dL (ref 65–99)
POTASSIUM: 3.5 mmol/L (ref 3.4–5.3)
POTASSIUM: 3.4 mmol/L — AB (ref 3.5–5.1)
Potassium: 3.5 mmol/L (ref 3.5–5.1)
SODIUM: 127 mmol/L — AB (ref 137–147)
SODIUM: 121 mmol/L — AB (ref 135–145)
Sodium: 127 mmol/L — ABNORMAL LOW (ref 135–145)

## 2017-01-01 LAB — CBC
HCT: 25.3 % — ABNORMAL LOW (ref 39.0–52.0)
HEMOGLOBIN: 8.9 g/dL — AB (ref 13.0–17.0)
MCH: 33.1 pg (ref 26.0–34.0)
MCHC: 35.2 g/dL (ref 30.0–36.0)
MCV: 94.1 fL (ref 78.0–100.0)
Platelets: 122 10*3/uL — ABNORMAL LOW (ref 150–400)
RBC: 2.69 MIL/uL — AB (ref 4.22–5.81)
RDW: 13.7 % (ref 11.5–15.5)
WBC: 5.3 10*3/uL (ref 4.0–10.5)

## 2017-01-01 LAB — IRON AND TIBC
IRON: 59 ug/dL (ref 45–182)
SATURATION RATIOS: 34 % (ref 17.9–39.5)
TIBC: 175 ug/dL — ABNORMAL LOW (ref 250–450)
UIBC: 116 ug/dL

## 2017-01-01 LAB — FOLATE: FOLATE: 26.6 ng/mL (ref >5.9)

## 2017-01-01 LAB — TSH: TSH: 2.857 u[IU]/mL (ref 0.350–4.500)

## 2017-01-01 LAB — GLUCOSE, CAPILLARY
GLUCOSE-CAPILLARY: 65 mg/dL (ref 65–99)
GLUCOSE-CAPILLARY: 78 mg/dL (ref 65–99)

## 2017-01-01 LAB — CBC AND DIFFERENTIAL
HCT: 25 % — AB (ref 41–53)
HEMOGLOBIN: 8.9 g/dL — AB (ref 13.5–17.5)
Platelets: 122 10*3/uL — AB (ref 150–399)
WBC: 5.3 10*3/mL

## 2017-01-01 LAB — VITAMIN B12: VITAMIN B 12: 502 pg/mL (ref 180–914)

## 2017-01-01 LAB — RETICULOCYTES
RBC.: 2.67 MIL/uL — ABNORMAL LOW (ref 4.22–5.81)
Retic Count, Absolute: 29.4 10*3/uL (ref 19.0–186.0)
Retic Ct Pct: 1.1 % (ref 0.4–3.1)

## 2017-01-01 LAB — FERRITIN: FERRITIN: 234 ng/mL (ref 24–336)

## 2017-01-01 LAB — OSMOLALITY: Osmolality: 273 mOsm/kg — ABNORMAL LOW (ref 275–295)

## 2017-01-01 MED ORDER — DOCUSATE SODIUM 100 MG PO CAPS
200.0000 mg | ORAL_CAPSULE | Freq: Every day | ORAL | Status: DC
  Administered 2017-01-01: 200 mg via ORAL
  Filled 2017-01-01: qty 2

## 2017-01-01 MED ORDER — POLYETHYLENE GLYCOL 3350 17 G PO PACK: 17.0000 g | PACK | Freq: Every day | ORAL | Status: DC | PRN

## 2017-01-01 MED ORDER — BISACODYL 10 MG RE SUPP
10.0000 mg | Freq: Every day | RECTAL | Status: DC | PRN
  Administered 2017-01-02 – 2017-01-03 (×2): 10 mg via RECTAL
  Filled 2017-01-01 (×2): qty 1

## 2017-01-01 MED ORDER — ACETAMINOPHEN 325 MG PO TABS
650.0000 mg | ORAL_TABLET | Freq: Four times a day (QID) | ORAL | Status: DC
  Administered 2017-01-01 – 2017-01-02 (×4): 650 mg via ORAL
  Filled 2017-01-01 (×4): qty 2

## 2017-01-01 MED ORDER — BISACODYL 10 MG RE SUPP
10.0000 mg | Freq: Once | RECTAL | Status: AC
Start: 2017-01-01 — End: 2017-01-01
  Administered 2017-01-01: 10 mg via RECTAL
  Filled 2017-01-01: qty 1

## 2017-01-01 MED ORDER — SENNA 8.6 MG PO TABS
1.0000 | ORAL_TABLET | Freq: Every evening | ORAL | Status: DC | PRN
  Administered 2017-01-01 – 2017-01-02 (×2): 8.6 mg via ORAL
  Filled 2017-01-01 (×2): qty 1

## 2017-01-01 MED ORDER — LIDOCAINE 5 % EX PTCH
1.0000 | MEDICATED_PATCH | CUTANEOUS | Status: DC
  Administered 2017-01-01: 1 via TRANSDERMAL
  Filled 2017-01-01: qty 1

## 2017-01-01 NOTE — Progress Notes (Signed)
PROGRESS NOTE    [name]  RWE:[id number] DOB: [birth year] DOA: 12/31/2016 PCP: [name], NP  Brief Narrative:Peter Wolfe is a 87/M with hypertension, GERD, depression, anxiety, and recent fall with thoracic compression fracture and resulting pain who presents to the emergency department with intractable pain in the back and bilateral ribs with inability to get up from bed or care from himself secondary to this. Patient was admitted briefly on 12/29/2016 after suffering a thoracic compression fracture. Neurosurgery was consulted and advised conservative management and the patient was discharged with pain medications. He was also noted to have an acute kidney injury during that admission and his NSAID and diuretics were discontinued at time of discharge. Since returning home, his pain has been on bearable despite his use of the prescribed Norco. Pain is much worse with any movement and he has essentially been bedbound since returning home. He lives alone and hasn't been able to care for himself secondary to this. Chemistry panel features a sodium of 120, potassium 3.3, BUN 24, and serum creatinine 1.51. CBC is notable for a normocytic anemia with hemoglobin of 9.4 and a thrombocytopenia with platelets 122,000. Urinalysis suggests a possible infection.  Assessment & Plan:   1. Hyponatremia, hypovolemic   - Pt presents with pain and generalized weakness, unable to get up from bed   - He reports feeling dehydrated, so was drinking a lot of water on day of presentation - clinically dry, Serum sodium 120; had been 130 two days prior  - Secondary to hypovolemia, free-water intake  -improving, continue IVF today  2. UTI  - Pt complains of dysuria and urinary urgency/frequency  - UA is compatible with infection in this setting; continue ceftriaxone/FU urine Cx - continue FLomax  3. Back pain /recent fall with T9 body fracture  - NSG had been consulted at time of recent  injury and advised conservative mgmt  - Continue prn analgesia; PT requested   -add lidocaine patch and schedule tylenol -wold benefit from short term rehab, went home 2days back and lives alone  4. Normocytic anemia, thrombocytopenia  - Hgb is 9.4 on admission, down from 10 two days prior  - Platelets stable at 121k  - No evidence for bleeding, will monitor    5. Depression  - Continue Buspar, Remeron, Prozac   6. Hypokalemia  - Serum potassium is 3.3 on admission - replaced  DVT prophylaxis: sq heparin Code Status: Full  Family Communication: Discussed with patient Disposition Plan: SNF in few days  Antimicrobials:   Ceftriaxone   Subjective: Still having back pain  Objective: Vitals:   01/01/17 0258 01/01/17 0700 01/01/17 1042 01/01/17 1201  BP: (!) 156/80  (!) 128/56 119/65  Pulse: 79  76 69  Resp: 18  18 18   Temp: 98.3 F (36.8 C)  97.8 F (36.6 C)   TempSrc: Oral  Oral   SpO2: 95%  96% 98%  Weight:  62.1 kg (137 lb)    Height:        Intake/Output Summary (Last 24 hours) at 01/01/17 1209 Last data filed at 01/01/17 1203  Gross per 24 hour  Intake             1293 ml  Output             1200 ml  Net               93 ml   Filed Weights   12/31/16 2258 01/01/17 0700  Weight: 63.6 kg (140 lb 4.8 oz) 62.1 kg (137 lb)    Examination:  General exam: Appears calm and comfortable  Respiratory system: Clear to auscultation. Respiratory effort normal. Cardiovascular system: S1 & S2 heard, RRR. No JVD, murmurs, rubs, gallops or clicks. No pedal edema. Gastrointestinal system: Abdomen is nondistended, soft and nontender. No organomegaly or masses felt. Normal bowel sounds heard. Central nervous system: Alert and oriented. No focal neurological deficits. Extremities: Symmetric 5 x 5 power. Skin: No rashes, lesions or ulcers Psychiatry: Judgement and insight appear normal. Mood & affect appropriate.     Data Reviewed:   CBC:  Recent Labs Lab  12/29/16 0111 12/29/16 0827 12/31/16 1837 01/01/17 0337  WBC 8.1 8.1 5.7 5.3  NEUTROABS 5.1  --   --   --   HGB 10.5* 10.0* 9.4* 8.9*  HCT 29.7* 28.0* 25.7* 25.3*  MCV 97.1 94.9 93.1 94.1  PLT 126* 121* 122* 270*   Basic Metabolic Panel:  Recent Labs Lab 12/29/16 0111 12/29/16 0827 12/31/16 1837 12/31/16 2328 01/01/17 0337  NA 131* 130* 120* 121* 127*  K 3.6 3.6 3.3* 3.4* 3.5  CL 98* 98* 88* 89* 94*  CO2 22 24 20* 22 25  GLUCOSE 106* 104* 97 96 74  BUN 30* 29* 24* 21* 19  CREATININE 1.90* 1.86* 1.51* 1.40* 1.38*  CALCIUM 9.4 9.3 8.8* 8.3* 8.7*   GFR: Estimated Creatinine Clearance: 34.4 mL/min (A) (by C-G formula based on SCr of 1.38 mg/dL (H)). Liver Function Tests:  Recent Labs Lab 12/29/16 0827  AST 19  ALT 17  ALKPHOS 77  BILITOT 1.2  PROT 9.6*  ALBUMIN 3.3*   No results for input(s): LIPASE, AMYLASE in the last 168 hours. No results for input(s): AMMONIA in the last 168 hours. Coagulation Profile: No results for input(s): INR, PROTIME in the last 168 hours. Cardiac Enzymes:  Recent Labs Lab 12/29/16 0827  CKTOTAL 46*   BNP (last 3 results) No results for input(s): PROBNP in the last 8760 hours. HbA1C: No results for input(s): HGBA1C in the last 72 hours. CBG:  Recent Labs Lab 01/01/17 0753 01/01/17 0854  GLUCAP 65 78   Lipid Profile: No results for input(s): CHOL, HDL, LDLCALC, TRIG, CHOLHDL, LDLDIRECT in the last 72 hours. Thyroid Function Tests:  Recent Labs  01/01/17 0337  TSH 2.857   Anemia Panel:  Recent Labs  12/31/16 2328  VITAMINB12 502  FOLATE 26.6  FERRITIN 234  TIBC 175*  IRON 59  RETICCTPCT 1.1   Urine analysis:    Component Value Date/Time   COLORURINE YELLOW 12/31/2016 1837   APPEARANCEUR HAZY (A) 12/31/2016 1837   LABSPEC 1.016 12/31/2016 1837   PHURINE 6.0 12/31/2016 1837   GLUCOSEU NEGATIVE 12/31/2016 1837   HGBUR NEGATIVE 12/31/2016 1837   HGBUR negative 06/13/2010 0000   BILIRUBINUR NEGATIVE  12/31/2016 1837   BILIRUBINUR n 07/31/2015 1224   KETONESUR 5 (A) 12/31/2016 1837   PROTEINUR NEGATIVE 12/31/2016 1837   UROBILINOGEN 1.0 07/31/2015 1224   UROBILINOGEN 2.0 06/13/2010 0000   NITRITE NEGATIVE 12/31/2016 1837   LEUKOCYTESUR TRACE (A) 12/31/2016 1837   Sepsis Labs: @LABRCNTIP (procalcitonin:4,lacticidven:4)  )No results found for this or any previous visit (from the past 240 hour(s)).       Radiology Studies: Dg Abdomen Acute W/chest  Result Date: 12/31/2016 CLINICAL DATA:  83 year old presenting with generalized weakness and constipation. EXAM: DG ABDOMEN ACUTE W/ 1V CHEST COMPARISON:  CT abdomen and pelvis 09/19/2004. Chest x-rays 08/05/2016, 04/14/2016 and earlier. FINDINGS: Gas  within normal-caliber loops of small bowel throughout the abdomen and pelvis. Gas and expected stool burden throughout normal caliber colon. No visible opaque urinary tract calculi. Surgical clips in the right upper quadrant from prior cholecystectomy. No evidence of free intraperitoneal air or significant air-fluid levels on the erect image. Aortoiliofemoral atherosclerosis. Lumbar dextroscoliosis. Severe degenerative changes throughout the lumbar spine. Severe osseous demineralization. Cardiac silhouette moderately enlarged for AP technique, increased in size since 2017. Thoracic aorta atherosclerotic, unchanged. Large hiatal hernia, increased in size. Paratracheal opacity in the superior mediastinum is likely related to the combination of thyroid gland enlargement and mediastinal fat better seen on the AP image. Lungs clear. Bronchovascular markings normal. Pulmonary vascularity normal. No visible pleural effusions. No pneumothorax. IMPRESSION: 1. No acute abdominal abnormality. 2. Moderate cardiomegaly, with interval increase in heart size since November, 2017. 3.  No acute cardiopulmonary disease. 4. Large hiatal hernia. Electronically Signed   By: Evangeline Dakin M.D.   On: 12/31/2016 19:31         Scheduled Meds: . busPIRone  30 mg Oral Daily  . cefTRIAXone (ROCEPHIN)  IV  1 g Intravenous Q24H  . cloNIDine  0.1 mg Oral Daily  . FLUoxetine  40 mg Oral Daily  . heparin  5,000 Units Subcutaneous Q8H  . mirtazapine  45 mg Oral QHS  . multivitamin with minerals  1 tablet Oral Daily  . pantoprazole  40 mg Oral Daily  . polyethylene glycol  17 g Oral Daily  . simvastatin  10 mg Oral QHS  . sodium chloride flush  3 mL Intravenous Q12H  . tamsulosin  0.4 mg Oral Daily   Continuous Infusions:   LOS: 1 day    Time spent: 79min    Domenic Polite, MD [facility] Pager [phone]  If 7PM-7AM, please contact night-coverage [email or web address] Password [email or web address] 01/01/2017, 12:09 PM

## 2017-01-01 NOTE — Evaluation (Signed)
Physical Therapy Evaluation Patient Details Name: Peter Wolfe MRN: [id number] DOB: 01-Mar-1929 Today's Date: 01/01/2017   History of Present Illness  Pt is an 83 y/o male admitted with intractable back pain. Pt recently admitted and d/c'd secondary to sustaining a fall with T9 compression fx on 12/29/16. Pt also found to have a UTI, anemia, hyponatremia and hypokalemia. Pt was sent home with a TLSO to be donned in supine per chart review. PMH: COPD, anxiety/depression, HTN, CKD.  Clinical Impression  Pt presented supine in bed with HOB elevated, awake and willing to participate in therapy session. Pt was very confused throughout evaluation, unable to give clear information regarding PLOF or even precautions since previous recent admission (12/29/16). Pt at a very high fall risk as he lives alone and does not have much support. PT recommending pt d/c to SNF for post acute rehab services prior to returning home. Pt would continue to benefit from skilled physical therapy services at this time while admitted and after d/c to address the below listed limitations in order to improve overall safety and independence with functional mobility.     Follow Up Recommendations SNF    Equipment Recommendations  None recommended by PT    Recommendations for Other Services       Precautions / Restrictions Precautions Precautions: Fall;Back Precaution Comments: reviewed back precautions related to bed mobility, pt without recall of back precautions instructed when last admitted Spinal Brace: Other (comment) (TLSO not present) Spinal Brace Comments: pt reports the TLSO was too tight and made it hard to breathe Restrictions Weight Bearing Restrictions: No      Mobility  Bed Mobility Overal bed mobility: Needs Assistance Bed Mobility: Rolling;Sidelying to Sit;Sit to Sidelying Rolling: Min assist Sidelying to sit: Min assist     Sit to sidelying: Mod assist;+2 for physical assistance General  bed mobility comments: multimodal cues and assist for log rolling, assist to raise LEs into bed  Transfers Overall transfer level: Needs assistance Equipment used: Rolling walker (2 wheeled) Transfers: Sit to/from Stand Sit to Stand: Min assist;+2 safety/equipment         General transfer comment: cues for hand placement, assist to rise and steady, pt bracing legs on bed  Ambulation/Gait Ambulation/Gait assistance: Min assist;Mod assist;+2 safety/equipment Ambulation Distance (Feet): 20 Feet Assistive device: Rolling walker (2 wheeled) Gait Pattern/deviations: Step-to pattern;Step-through pattern;Trunk flexed Gait velocity: decreased Gait velocity interpretation: Below normal speed for age/gender General Gait Details: pt with modest instability with ambulation. pt became fatigued after ambulating from his bed to the sink and his LEs trembled. Pt required constant min A and occasional mod A for stability and safety with RW.  Stairs            Wheelchair Mobility    Modified Rankin (Stroke Patients Only)       Balance Overall balance assessment: History of Falls;Needs assistance Sitting-balance support: No upper extremity supported;Feet supported Sitting balance-Leahy Scale: Fair     Standing balance support: Bilateral upper extremity supported;During functional activity Standing balance-Leahy Scale: Poor Standing balance comment: reliant on RW or external surface in standing                             Pertinent Vitals/Pain Pain Assessment: Faces Faces Pain Scale: Hurts even more Pain Location: back Pain Descriptors / Indicators: Sore Pain Intervention(s): Monitored during session;Repositioned    Home Living Family/patient expects to be discharged to:: Private residence Living Arrangements: Alone  Available Help at Discharge: Friend(s);Available PRN/intermittently Type of Home: Apartment Home Access: Level entry     Home Layout: One level Home  Equipment: Walker - 2 wheels;Cane - single point;Tub bench      Prior Function Level of Independence: Needs assistance   Gait / Transfers Assistance Needed: reports walking with a RW, per chart, pt primarily bedbound since spinal fx  ADL's / [place] Needed: friend assist with groceries, was modified independent at baseline, was not able to care for himself since spinal fx        Hand Dominance   Dominant Hand: Right    Extremity/Trunk Assessment   Upper Extremity Assessment Upper Extremity Assessment: Defer to OT evaluation    Lower Extremity Assessment Lower Extremity Assessment: Generalized weakness    Cervical / Trunk Assessment Cervical / Trunk Assessment: Other exceptions Cervical / Trunk Exceptions: T9 compression fx  Communication   Communication: Expressive difficulties  Cognition Arousal/Alertness: Awake/alert Behavior During Therapy: WFL for tasks assessed/performed Overall Cognitive Status: Impaired/Different from baseline Area of Impairment: Orientation;Memory;Following commands;Safety/judgement;Problem solving                 Orientation Level: Disoriented to;Place;Time;Situation   Memory: Decreased short-term memory;Decreased recall of precautions Following Commands: Follows one step commands with increased time (and multi modal cues) Safety/Judgement: Decreased awareness of safety;Decreased awareness of deficits   Problem Solving: Slow processing;Decreased initiation;Difficulty sequencing;Requires verbal cues;Requires tactile cues        General Comments      Exercises     Assessment/Plan    PT Assessment Patient needs continued PT services  PT Problem List Decreased strength;Decreased activity tolerance;Decreased balance;Decreased mobility;Decreased coordination;Decreased cognition;Decreased knowledge of use of DME;Decreased knowledge of precautions;Decreased safety awareness;Pain       PT Treatment Interventions DME  instruction;Gait training;Stair training;Functional mobility training;Therapeutic activities;Therapeutic exercise;Balance training;Neuromuscular re-education;Cognitive remediation;Patient/family education    PT Goals (Current goals can be found in the Care Plan section)  Acute Rehab PT Goals Patient Stated Goal: to go home and take care of my bills PT Goal Formulation: With patient Time For Goal Achievement: 01/15/17 Potential to Achieve Goals: Good    Frequency Min 3X/week   Barriers to discharge        Co-evaluation PT/OT/SLP Co-Evaluation/Treatment: Yes Reason for Co-Treatment: For patient/therapist safety;To address functional/ADL transfers PT goals addressed during session: Mobility/safety with mobility;Balance;Proper use of DME;Strengthening/ROM OT goals addressed during session: ADL's and self-care       End of Session Equipment Utilized During Treatment: Gait belt (no TLSO available) Activity Tolerance: Patient limited by pain;Patient limited by fatigue Patient left: in chair;with call bell/phone within reach;with chair alarm set Nurse Communication: Mobility status PT Visit Diagnosis: History of falling (Z91.81);Difficulty in walking, not elsewhere classified (R26.2);Pain Pain - part of body:  (back)    Time: 3149-7026 PT Time Calculation (min) (ACUTE ONLY): 29 min   Charges:   PT Evaluation $PT Eval Moderate Complexity: 1 Procedure     PT G Codes:        Sherie Don, PT, DPT [place] 01/01/2017, 4:05 PM

## 2017-01-01 NOTE — Consult Note (Signed)
   [facility] CM Inpatient Consult   01/01/2017  Peter Wolfe 09/22/1929 [id number]  Patient assessed for re-hospitalization in the past month. Chart review reveals the patient  Peter Wolfe is a 83 y.o. male with medical history significant for hypertension, GERD, depression, anxiety, and recent fall with thoracic compression fracture and resulting pain who presents to the emergency department with intractable pain in the back and bilateral ribs with inability to get up from bed or care from himself secondary to this. Patient was admitted briefly on 12/29/2016 after suffering a thoracic compression fracture. Neurosurgery was consulted and advised conservative management and the patient was discharged with pain medications. He was also noted to have an acute kidney injury during that admission and his NSAID and diuretics were discontinued at time of discharge.  Met with the patient at the bedside.  Patient states he is having pain so bad that he is unable to care for himself and he has three  '81 year old male friends' who tries to help him out from his church with food, getting to appointment, and no issues with medications.  He states he has been contacted by [facility] from his last hospital stay but he is not sure he is able to care for himself in his current state at home. He endorses [name], NP as his primary care provider and has an upcoming appointment in May. He states he would look over the brochure to see if he will need the [facility] services. He states,"right now I just don't know where I will end up."  A brochure with contact information and 24 hour nurse advise line given.  Patient receives transition of care calls from his primary care provider at [facility] at [place].  No further needs noted or verbalized at this time.   Natividad Brood, RN BSN [facility] Liaison  [phone] business mobile phone Toll free office  [phone]

## 2017-01-01 NOTE — Evaluation (Signed)
Occupational Therapy Evaluation Patient Details Name: Peter Wolfe MRN: [id number] DOB: 1929/04/24 Today's Date: 01/01/2017    History of Present Illness Pt admitted with back pain due to fall with T9 fx on 12/29/16, UTI, anemia, hyponatremia, hypokalemia. Pt was sent home with a TLSO to be donned in supine per chart review. PMH: COPD, anxiety/depression, HTN, CKD.   Clinical Impression   Pt presents with impaired cognition, decreased standing balance and back pain. His TLSO was not present for evaluation and pt reports his has not been wearing brace at home. He required multimodal cues to follow back precautions. Pt is a high fall risk and lives alone. Recommending SNF for post acute rehab prior to return home. Will follow acutely.    Follow Up Recommendations  SNF;Supervision/Assistance - 24 hour    Equipment Recommendations       Recommendations for Other Services       Precautions / Restrictions Precautions Precautions: Fall;Back Precaution Comments: reviewed back precautions related to bed mobility, pt without recall of back precautions instructed when last admitted Spinal Brace: Other (comment) (TLSO not available) Spinal Brace Comments: pt reports the TLSO was too tight and made it hard to breathe Restrictions Weight Bearing Restrictions: No      Mobility Bed Mobility Overal bed mobility: Needs Assistance Bed Mobility: Rolling;Sidelying to Sit;Sit to Sidelying Rolling: Min assist Sidelying to sit: Min assist     Sit to sidelying: Mod assist;+2 for physical assistance General bed mobility comments: multimodal cues and assist for log rolling, assist to raise LEs into bed  Transfers Overall transfer level: Needs assistance Equipment used: Rolling walker (2 wheeled) Transfers: Sit to/from Stand Sit to Stand: Min assist;+2 safety/equipment         General transfer comment: cues for hand placement, assist to rise and steady, pt bracing legs on bed     Balance Overall balance assessment: History of Falls;Needs assistance Sitting-balance support: No upper extremity supported;Feet supported Sitting balance-Leahy Scale: Fair     Standing balance support: Bilateral upper extremity supported;During functional activity Standing balance-Leahy Scale: Poor Standing balance comment: reliant on RW or external surface in standing                           ADL either performed or assessed with clinical judgement   ADL Overall ADL's : Needs assistance/impaired Eating/Feeding: Independent;Sitting   Grooming: Wash/dry hands;Standing;Minimal assistance Grooming Details (indicate cue type and reason): pt needing cues to locate towels, soap, leans on sink Upper Body Bathing: Minimal assistance;Sitting   Lower Body Bathing: Sit to/from stand;Maximal assistance   Upper Body Dressing : Minimal assistance;Sitting   Lower Body Dressing: Maximal assistance;Sit to/from stand   Toilet Transfer: Minimal assistance;Ambulation;RW;BSC   Toileting- Clothing Manipulation and Hygiene: Moderate assistance       Functional mobility during ADLs: Minimal assistance;Rolling walker;Cueing for safety (pt with knee buckling) General ADL Comments: No brace available for evaluation. Pt requires B UE support for standing.     Vision Patient Visual Report: No change from baseline       Perception     Praxis      Pertinent Vitals/Pain Pain Assessment: Faces Faces Pain Scale: Hurts even more Pain Location: back Pain Descriptors / Indicators: Sore Pain Intervention(s): Monitored during session;Premedicated before session;Repositioned     Hand Dominance Right   Extremity/Trunk Assessment Upper Extremity Assessment Upper Extremity Assessment: Overall WFL for tasks assessed   Lower Extremity Assessment Lower Extremity Assessment: Defer  to PT evaluation   Cervical / Trunk Assessment Cervical / Trunk Assessment: Other exceptions Cervical /  Trunk Exceptions: T9 fx   Communication Communication Communication: Expressive difficulties   Cognition Arousal/Alertness: Awake/alert Behavior During Therapy: WFL for tasks assessed/performed Overall Cognitive Status: Impaired/Different from baseline Area of Impairment: Orientation;Memory;Following commands;Safety/judgement;Problem solving                 Orientation Level: Disoriented to;Place;Time;Situation   Memory: Decreased short-term memory;Decreased recall of precautions Following Commands: Follows one step commands with increased time (and multimodal cues) Safety/Judgement: Decreased awareness of safety;Decreased awareness of deficits   Problem Solving: Slow processing;Decreased initiation;Difficulty sequencing;Requires verbal cues;Requires tactile cues     General Comments       Exercises     Shoulder Instructions      Home Living Family/patient expects to be discharged to:: Private residence Living Arrangements: Alone Available Help at Discharge: Friend(s);Available PRN/intermittently Type of Home: Apartment Home Access: Level entry     Home Layout: One level     Bathroom Shower/Tub: Teacher, early years/pre: Standard     Home Equipment: Environmental consultant - 2 wheels;Cane - single point;Tub bench          Prior Functioning/Environment Level of Independence: Needs assistance  Gait / Transfers Assistance Needed: reports walking with a RW, per chart, pt primarily bedbound since spinal fx ADL's / Homemaking Assistance Needed: friend assist with groceries, was modified independent at baseline, was not able to care for himself since spinal fx            OT Problem List: Decreased strength;Impaired balance (sitting and/or standing);Pain;Decreased knowledge of use of DME or AE;Decreased cognition;Decreased safety awareness;Decreased knowledge of precautions      OT Treatment/Interventions: Self-care/ADL training;Patient/family education;DME and/or  AE instruction;Balance training;Therapeutic activities    OT Goals(Current goals can be found in the care plan section) Acute Rehab OT Goals Patient Stated Goal: to go home and take care of my bills OT Goal Formulation: With patient Time For Goal Achievement: 01/15/17 Potential to Achieve Goals: Good ADL Goals Pt Will Perform Grooming: with min guard assist;standing (2 activities) Pt Will Perform Lower Body Bathing: with min guard assist;with adaptive equipment;sit to/from stand Pt Will Perform Lower Body Dressing: with min guard assist;with adaptive equipment;sit to/from stand Pt Will Transfer to Toilet: with min guard assist;ambulating;bedside commode (over toilet) Pt Will Perform Toileting - Clothing Manipulation and hygiene: with min guard assist;sit to/from stand Additional ADL Goal #1: Pt will recall back precautions with min verbal cues.  OT Frequency: Min 2X/week   Barriers to D/C: Decreased caregiver support          Co-evaluation PT/OT/SLP Co-Evaluation/Treatment: Yes Reason for Co-Treatment: For patient/therapist safety   OT goals addressed during session: ADL's and self-care      End of Session Equipment Utilized During Treatment: Rolling walker;Gait belt  Activity Tolerance: Patient tolerated treatment well Patient left: in bed;with call bell/phone within reach;with bed alarm set  OT Visit Diagnosis: Unsteadiness on feet (R26.81);History of falling (Z91.81);Pain;Other symptoms and signs involving cognitive function;Repeated falls (R29.6) Pain - part of body:  (back)                Time: 1336-1405 OT Time Calculation (min): 29 min Charges:  OT General Charges $OT Visit: 1 Procedure OT Evaluation $OT Eval Moderate Complexity: 1 Procedure G-Codes:       Malka So 01/01/2017, 2:56 PM  [phone]

## 2017-01-02 ENCOUNTER — Encounter: Payer: Self-pay | Admitting: Licensed Clinical Social Worker

## 2017-01-02 ENCOUNTER — Other Ambulatory Visit: Payer: Self-pay | Admitting: Licensed Clinical Social Worker

## 2017-01-02 DIAGNOSIS — N183 Chronic kidney disease, stage 3 (moderate): Secondary | ICD-10-CM

## 2017-01-02 LAB — URINE CULTURE: Culture: NO GROWTH

## 2017-01-02 LAB — GLUCOSE, CAPILLARY: GLUCOSE-CAPILLARY: 89 mg/dL (ref 65–99)

## 2017-01-02 LAB — BASIC METABOLIC PANEL
ANION GAP: 9 (ref 5–15)
BUN: 20 mg/dL (ref 4–21)
BUN: 20 mg/dL (ref 6–20)
CO2: 25 mmol/L (ref 22–32)
CREATININE: 1.73 mg/dL — AB (ref 0.61–1.24)
Calcium: 8.7 mg/dL — ABNORMAL LOW (ref 8.9–10.3)
Chloride: 100 mmol/L — ABNORMAL LOW (ref 101–111)
Creatinine: 1.7 mg/dL — AB (ref 0.6–1.3)
GFR calc non Af Amer: 34 mL/min — ABNORMAL LOW (ref >60)
GFR, EST AFRICAN AMERICAN: 40 mL/min — AB (ref >60)
Glucose, Bld: 85 mg/dL (ref 65–99)
Glucose: 85 mg/dL
Potassium: 3.4 mmol/L (ref 3.4–5.3)
Potassium: 3.4 mmol/L — ABNORMAL LOW (ref 3.5–5.1)
SODIUM: 134 mmol/L — AB (ref 135–145)
Sodium: 134 mmol/L — AB (ref 137–147)

## 2017-01-02 MED ORDER — LIDOCAINE 5 % EX PTCH
2.0000 | MEDICATED_PATCH | CUTANEOUS | Status: DC
  Administered 2017-01-02 – 2017-01-04 (×3): 2 via TRANSDERMAL
  Filled 2017-01-02 (×4): qty 2

## 2017-01-02 MED ORDER — HYDROCODONE-ACETAMINOPHEN 5-325 MG PO TABS
1.0000 | ORAL_TABLET | Freq: Four times a day (QID) | ORAL | Status: DC | PRN
  Administered 2017-01-02 – 2017-01-05 (×10): 1 via ORAL
  Filled 2017-01-02 (×11): qty 1

## 2017-01-02 MED ORDER — METHYLPREDNISOLONE 4 MG PO TABS
4.0000 mg | ORAL_TABLET | Freq: Every day | ORAL | Status: DC
Start: 2017-01-02 — End: 2017-01-03
  Administered 2017-01-02 – 2017-01-03 (×2): 4 mg via ORAL
  Filled 2017-01-02 (×2): qty 1

## 2017-01-02 MED ORDER — MORPHINE SULFATE (PF) 2 MG/ML IV SOLN
1.0000 mg | INTRAVENOUS | Status: DC | PRN
  Administered 2017-01-03 – 2017-01-05 (×4): 1 mg via INTRAVENOUS
  Filled 2017-01-02 (×4): qty 1

## 2017-01-02 NOTE — Patient Outreach (Signed)
  [facility]) Care Management  01/02/2017  TRE SANKER III June 17, 1929 [id number]  Assessment- CSW received new referral on patient for SNF admission on 01/02/17. CSW will follow case and await for hospital discharge in order to complete SNF visit.  Plan-CSW will complete SNF visit within one week. CSW will send involvement letter to PCP.  Eula Fried, BSW, [email or web address], [email or web address] Phone: [phone] Fax: [phone]

## 2017-01-02 NOTE — Clinical Social Work Note (Addendum)
H&P, FL2, and 30 day note faxed to [place] [facility] for review for PASARR.  Dayton Scrape, CSW [phone]  4:34 pm PASARR obtained: [id number] A. Bed offers given to patient and he has chosen [facility]. Admissions coordinator notified.  Dayton Scrape, [place]

## 2017-01-02 NOTE — Clinical Social Work Placement (Signed)
   CLINICAL SOCIAL WORK PLACEMENT  NOTE  Date:  01/02/2017  Patient Details  Name: Peter Wolfe MRN: [id number] Date of Birth: [birth year]  Clinical Social Work is seeking post-discharge placement for this patient at the [place] level of care (*CSW will initial, date and re-position this form in  chart as items are completed):  Yes   Patient/family provided with [place] Work Department's list of facilities offering this level of care within the geographic area requested by the patient (or if unable, by the patient's family).  Yes   Patient/family informed of their freedom to choose among providers that offer the needed level of care, that participate in Medicare, Medicaid or managed care program needed by the patient, have an available bed and are willing to accept the patient.  Yes   Patient/family informed of [facility]'s ownership interest in [facility] and [facility], as well as of the fact that they are under no obligation to receive care at these facilities.  PASRR submitted to EDS on 01/02/17     PASRR number received on       Existing PASRR number confirmed on       FL2 transmitted to all facilities in geographic area requested by pt/family on 01/02/17     FL2 transmitted to all facilities within larger geographic area on       Patient informed that his/her managed care company has contracts with or will negotiate with certain facilities, including the following:            Patient/family informed of bed offers received.  Patient chooses bed at       Physician recommends and patient chooses bed at      Patient to be transferred to   on  .  Patient to be transferred to facility by       Patient family notified on   of transfer.  Name of family member notified:        PHYSICIAN Please sign FL2     Additional Comment:    _______________________________________________ Candie Chroman, LCSW 01/02/2017,  12:02 PM

## 2017-01-02 NOTE — Clinical Social Work Note (Addendum)
Patient refusing SNF, stating he'll likely go to SNF if he is hospitalized again. He prefers to return home with [place] [facility] following. RNCM notified. Will page MD to make her aware.  CSW signing off. Consult again if any other social work needs arise.  Peter Wolfe, [place]

## 2017-01-02 NOTE — Clinical Social Work Note (Signed)
Clinical Social Work Assessment  Patient Details  Name: Peter Wolfe MRN: [id number] Date of Birth: [birth year]  Date of referral:  01/02/17               Reason for consult:  Facility Placement, Discharge Planning                Permission sought to share information with:  Chartered certified accountant granted to share information::  Yes, Verbal Permission Granted  Name::        Agency::  SNF's  Relationship::     Contact Information:     Housing/Transportation Living arrangements for the past 2 months:  Apartment Source of Information:  Patient, Medical Team Patient Interpreter Needed:  None Criminal Activity/Legal Involvement Pertinent to Current Situation/Hospitalization:  No - Comment as needed Significant Relationships:  Friend, Other Family Members Lives with:  Self Do you feel safe going back to the place where you live?  Yes Need for family participation in patient care:  Yes (Comment)  Care giving concerns:  PT recommending SNF once medically stable for discharge.   Social Worker assessment / plan:  CSW met with patient. No supports at bedside. CSW introduced role and explained that PT recommendations would be discussed. Patient initially refusing SNF but after discussion with doctor about recent recurrent admissions, he is agreeable to placement. Patient wants to make sure that [facility] follows him after he discharges from SNF and is focused on how he will get his mail. No further concerns. CSW encouraged patient to contact CSW as needed. CSW will continue to follow patient for support and facilitate discharge to SNF once medically stable. PASARR under manual review due to diagnoses for depression and panic attacks. Patient cannot discharge to SNF until PASARR obtained.  Employment status:  Retired Forensic scientist:  Medicare PT Recommendations:  [place] / Referral to community resources:  [place]  Patient/Family's Response to care:  Patient agreeable to SNF placement. Patient's friends supportive and involved in patient's care. Patient appreciated social work intervention.  Patient/Family's Understanding of and Emotional Response to Diagnosis, Current Treatment, and Prognosis:  Patient does not have good insight into current medical status. He believes that he can manage his care at home health but has been readmitted multiple times within just the past few days. Patient appears happy with hospital care.  Emotional Assessment Appearance:  Appears stated age Attitude/Demeanor/Rapport:  Other (Pleasant) Affect (typically observed):  Accepting, Appropriate, Calm, Pleasant Orientation:  Oriented to Self, Oriented to Place, Oriented to  Time, Oriented to Situation Alcohol / Substance use:  Never Used Psych involvement (Current and /or in the community):  No (Comment)  Discharge Needs  Concerns to be addressed:  Care Coordination Readmission within the last 30 days:  Yes Current discharge risk:  Dependent with Mobility, Lives alone Barriers to Discharge:  Awaiting State Approval Tour manager), Continued Medical Work up   Candie Chroman, LCSW 01/02/2017, 11:59 AM

## 2017-01-02 NOTE — NC FL2 (Signed)
[place] LEVEL OF CARE SCREENING TOOL     IDENTIFICATION  Patient Name: Peter Wolfe Birthdate: 01/10/1929 Sex: male Admission Date (Current Location): 12/31/2016  [facility] [place] Number:  Herbalist and Address:  [facility], [place] [address] [place], [place] [address]      Provider Number: [id number]  Attending Physician Name and Address:  Domenic Polite, MD  Relative Name and Phone Number:       Current Level of Care: Hospital Recommended Level of Care: [place] Prior Approval Number:    Date Approved/Denied:   PASRR Number: Pending  Discharge Plan: SNF    Current Diagnoses: Patient Active Problem List   Diagnosis Date Noted  . Hyponatremia 12/31/2016  . Hypokalemia 12/31/2016  . Normocytic anemia 12/31/2016  . Acute lower UTI 12/31/2016  . Fall 12/29/2016  . Thoracic spine fracture ([place]) 12/29/2016  . Back pain 12/29/2016  . CKD (chronic kidney disease), stage Wolfe 12/29/2016  . Depression 06/03/2016  . History of fractured rib 11/28/2015  . History of peripheral edema 11/28/2015  . Seborrheic keratosis, inflamed 08/13/2015  . Actinic keratosis 08/13/2015  . Fatigue 07/31/2015  . Essential hypertension, benign 07/31/2015  . Urinary frequency 08/16/2009  . URTICARIA 11/13/2007  . Hyperlipidemia 06/08/2007  . GERD 06/08/2007  . PANIC ATTACK 05/07/2007  . COPD 05/07/2007  . INSOMNIA 05/07/2007    Orientation RESPIRATION BLADDER Height & Weight     Self, Time, Situation, Place  Normal Incontinent, External catheter Weight: 137 lb 6.4 oz (62.3 kg) (Scale B) Height:  5\' 6"  (167.6 cm)  BEHAVIORAL SYMPTOMS/MOOD NEUROLOGICAL BOWEL NUTRITION STATUS   (None)  (None) Continent Diet (Regular)  AMBULATORY STATUS COMMUNICATION OF NEEDS Skin   Limited Assist Verbally Normal                       Personal Care Assistance Level of Assistance  Bathing, Feeding, Dressing Bathing  Assistance: Limited assistance Feeding assistance: Independent Dressing Assistance: Maximum assistance     Functional Limitations Info  Sight, Hearing, Speech Sight Info: Adequate Hearing Info: Adequate Speech Info: Adequate    SPECIAL CARE FACTORS FREQUENCY  PT (By licensed PT), Blood pressure, OT (By licensed OT)     PT Frequency: 5 x week OT Frequency: 5 x week            Contractures Contractures Info: Not present    Additional Factors Info  Code Status, Allergies, Psychotropic Code Status Info: Full Allergies Info: Influenza Virus Vacc Split Pf, Doxycycline, Eggs or Egg-Derived Products Psychotropic Info: Depression, Panic attacks: Buspar 30 mg PO daily, Prozac 40 mg PO daily, Remeron 45 mg PO QHS.         Current Medications (01/02/2017):  This is the current hospital active medication list Current Facility-Administered Medications  Medication Dose Route Frequency Provider Last Rate Last Dose  . albuterol (PROVENTIL) (2.5 MG/3ML) 0.083% nebulizer solution 2.5 mg  2.5 mg Inhalation Q6H PRN Vianne Bulls, MD      . bisacodyl (DULCOLAX) suppository 10 mg  10 mg Rectal Daily PRN Gardiner Barefoot, NP   10 mg at 01/02/17 0214  . busPIRone (BUSPAR) tablet 30 mg  30 mg Oral Daily Vianne Bulls, MD   30 mg at 01/01/17 1045  . cefTRIAXone (ROCEPHIN) 1 g in dextrose 5 % 50 mL IVPB  1 g Intravenous Q24H Franky Macho, RPH   1 g at 01/01/17 2103  .  cloNIDine (CATAPRES) tablet 0.1 mg  0.1 mg Oral Daily Ilene Qua Opyd, MD   0.1 mg at 01/02/17 1019  . FLUoxetine (PROZAC) capsule 40 mg  40 mg Oral Daily Ilene Qua Opyd, MD   40 mg at 01/02/17 1020  . heparin injection 5,000 Units  5,000 Units Subcutaneous Q8H Vianne Bulls, MD   5,000 Units at 01/02/17 0529  . HYDROcodone-acetaminophen (NORCO/VICODIN) 5-325 MG per tablet 1 tablet  1 tablet Oral Q6H PRN Domenic Polite, MD      . lidocaine (LIDODERM) 5 % 2 patch  2 patch Transdermal Q24H Domenic Polite, MD      .  methylPREDNISolone (MEDROL) tablet 4 mg  4 mg Oral Daily Domenic Polite, MD   4 mg at 01/02/17 1026  . mirtazapine (REMERON) tablet 45 mg  45 mg Oral QHS Vianne Bulls, MD   45 mg at 01/01/17 2127  . morphine 2 MG/ML injection 1 mg  1 mg Intravenous Q4H PRN Domenic Polite, MD      . multivitamin with minerals tablet 1 tablet  1 tablet Oral Daily Vianne Bulls, MD   1 tablet at 01/02/17 1020  . ondansetron (ZOFRAN) tablet 4 mg  4 mg Oral Q6H PRN Vianne Bulls, MD       Or  . ondansetron (ZOFRAN) injection 4 mg  4 mg Intravenous Q6H PRN Ilene Qua Opyd, MD      . pantoprazole (PROTONIX) EC tablet 40 mg  40 mg Oral Daily Vianne Bulls, MD   40 mg at 01/02/17 1019  . polyethylene glycol (MIRALAX / GLYCOLAX) packet 17 g  17 g Oral Daily Vianne Bulls, MD      . senna (SENOKOT) tablet 8.6 mg  1 tablet Oral QHS PRN Gardiner Barefoot, NP   8.6 mg at 01/01/17 2127  . simvastatin (ZOCOR) tablet 10 mg  10 mg Oral QHS Vianne Bulls, MD   10 mg at 01/01/17 2127  . sodium chloride flush (NS) 0.9 % injection 3 mL  3 mL Intravenous Q12H Vianne Bulls, MD   3 mL at 01/02/17 1028  . tamsulosin (FLOMAX) capsule 0.4 mg  0.4 mg Oral Daily Ilene Qua Opyd, MD   0.4 mg at 01/02/17 1019  . zolpidem (AMBIEN) tablet 5 mg  5 mg Oral QHS PRN Vianne Bulls, MD   5 mg at 01/01/17 2128     Discharge Medications: Please see discharge summary for a list of discharge medications.  Relevant Imaging Results:  Relevant Lab Results:   Additional Information SS#: [id number]  Candie Chroman, LCSW

## 2017-01-02 NOTE — Progress Notes (Signed)
PROGRESS NOTE    Peter Wolfe Wolfe  POE:[id number] DOB: 04-24-1929 DOA: 12/31/2016 PCP: [name], NP  Brief Narrative:Peter Wolfe is a 83/M with hypertension, GERD, depression, anxiety, and recent fall with thoracic compression fracture and resulting pain who presents to the emergency department with intractable pain in the back and bilateral ribs with inability to get up from bed or care from himself secondary to this. Patient was admitted briefly on 12/29/2016 after suffering a thoracic compression fracture. Neurosurgery was consulted and advised conservative management and the patient was discharged with pain medications. He was also noted to have an acute kidney injury during that admission and his NSAID and diuretics were discontinued at time of discharge. Since returning home, his pain has been on bearable despite his use of the prescribed Norco. Pain is much worse with any movement and he has essentially been bedbound since returning home. He lives alone and hasn't been able to care for himself secondary to this. Chemistry panel features a sodium of 120, potassium 3.3, BUN 24, and serum creatinine 1.51. CBC is notable for a normocytic anemia with hemoglobin of 9.4 and a thrombocytopenia with platelets 122,000. Urinalysis suggests a possible infection.  Assessment & Plan:   1. Hyponatremia, hypovolemic   - Secondary to hypovolemia, free-water intake  -improving, stop IVF today  2. UTI  - Pt complains of dysuria and urinary urgency/frequency  - UA is compatible with infection in this setting; continue ceftriaxone Day 2 - continue FLomax, urine Cx negative will stop Abx tomorrow  3. Back pain /recent fall with T9 body fracture  - NSG had been consulted at time of recent injury and advised conservative mgmt  - add medrol 4mg  and taper over 6days  -increase lidocaine patch  To 2, continue PRN hydrocodone  -needs short term rehab  4. Normocytic anemia,  thrombocytopenia  - Hgb is 9.4 on admission, down from 10 two days prior  - Platelets stable at 121k  - No evidence for bleeding, will monitor    5. Depression  - Continue Buspar, Remeron, Prozac   6. Hypokalemia  - Serum potassium is 3.3 on admission - replaced  DVT prophylaxis: sq heparin Code Status: Full  Family Communication: Discussed with patient Disposition Plan: SNF in few days  Antimicrobials:   Ceftriaxone   Subjective: Still having back pain, hydrocodone helping  Objective: Vitals:   01/01/17 1707 01/01/17 2043 01/02/17 0442 01/02/17 1016  BP: 133/72 120/90 (!) 145/82 136/77  Pulse: 80 90 96 81  Resp: 18 18 18    Temp: 98.3 F (36.8 C) 97.6 F (36.4 C) 97.7 F (36.5 C)   TempSrc: Oral Oral Oral   SpO2: 95% 100% 100% 96%  Weight:   62.3 kg (137 lb 6.4 oz)   Height:        Intake/Output Summary (Last 24 hours) at 01/02/17 1139 Last data filed at 01/02/17 1028  Gross per 24 hour  Intake              755 ml  Output             2420 ml  Net            -1665 ml   Filed Weights   12/31/16 2258 01/01/17 0700 01/02/17 0442  Weight: 63.6 kg (140 lb 4.8 oz) 62.1 kg (137 lb) 62.3 kg (137 lb 6.4 oz)    Examination:  General exam: Appears calm and comfortable  Respiratory system: Clear to auscultation. Respiratory effort  normal. Cardiovascular system: S1 & S2 heard, RRR. No JVD, murmurs, rubs, gallops or clicks. No pedal edema. Gastrointestinal system: Abdomen is nondistended, soft and nontender. No organomegaly or masses felt. Normal bowel sounds heard. Central nervous system: Alert and oriented. No focal neurological deficits. Extremities: Symmetric 5 x 5 power. Skin: No rashes, lesions or ulcers Psychiatry: Judgement and insight appear normal. Mood & affect appropriate.     Data Reviewed:   CBC:  Recent Labs Lab 12/29/16 0111 12/29/16 0827 12/31/16 1837 01/01/17 0337  WBC 8.1 8.1 5.7 5.3  NEUTROABS 5.1  --   --   --   HGB 10.5* 10.0*  9.4* 8.9*  HCT 29.7* 28.0* 25.7* 25.3*  MCV 97.1 94.9 93.1 94.1  PLT 126* 121* 122* 174*   Basic Metabolic Panel:  Recent Labs Lab 12/29/16 0827 12/31/16 1837 12/31/16 2328 01/01/17 0337 01/02/17 0737  NA 130* 120* 121* 127* 134*  K 3.6 3.3* 3.4* 3.5 3.4*  CL 98* 88* 89* 94* 100*  CO2 24 20* 22 25 25   GLUCOSE 104* 97 96 74 85  BUN 29* 24* 21* 19 20  CREATININE 1.86* 1.51* 1.40* 1.38* 1.73*  CALCIUM 9.3 8.8* 8.3* 8.7* 8.7*   GFR: Estimated Creatinine Clearance: 27.5 mL/min (A) (by C-G formula based on SCr of 1.73 mg/dL (H)). Liver Function Tests:  Recent Labs Lab 12/29/16 0827  AST 19  ALT 17  ALKPHOS 77  BILITOT 1.2  PROT 9.6*  ALBUMIN 3.3*   No results for input(s): LIPASE, AMYLASE in the last 168 hours. No results for input(s): AMMONIA in the last 168 hours. Coagulation Profile: No results for input(s): INR, PROTIME in the last 168 hours. Cardiac Enzymes:  Recent Labs Lab 12/29/16 0827  CKTOTAL 46*   BNP (last 3 results) No results for input(s): PROBNP in the last 8760 hours. HbA1C: No results for input(s): HGBA1C in the last 72 hours. CBG:  Recent Labs Lab 01/01/17 0753 01/01/17 0854 01/02/17 0724  GLUCAP 65 78 89   Lipid Profile: No results for input(s): CHOL, HDL, LDLCALC, TRIG, CHOLHDL, LDLDIRECT in the last 72 hours. Thyroid Function Tests:  Recent Labs  01/01/17 0337  TSH 2.857   Anemia Panel:  Recent Labs  12/31/16 2328  VITAMINB12 502  FOLATE 26.6  FERRITIN 234  TIBC 175*  IRON 59  RETICCTPCT 1.1   Urine analysis:    Component Value Date/Time   COLORURINE YELLOW 12/31/2016 1837   APPEARANCEUR HAZY (A) 12/31/2016 1837   LABSPEC 1.016 12/31/2016 1837   PHURINE 6.0 12/31/2016 1837   GLUCOSEU NEGATIVE 12/31/2016 1837   HGBUR NEGATIVE 12/31/2016 1837   HGBUR negative 06/13/2010 0000   BILIRUBINUR NEGATIVE 12/31/2016 1837   BILIRUBINUR n 07/31/2015 1224   KETONESUR 5 (A) 12/31/2016 1837   PROTEINUR NEGATIVE 12/31/2016  1837   UROBILINOGEN 1.0 07/31/2015 1224   UROBILINOGEN 2.0 06/13/2010 0000   NITRITE NEGATIVE 12/31/2016 1837   LEUKOCYTESUR TRACE (A) 12/31/2016 1837   Sepsis Labs: @LABRCNTIP (procalcitonin:4,lacticidven:4)  ) Recent Results (from the past 240 hour(s))  Urine culture     Status: None   Collection Time: 12/31/16  6:37 PM  Result Value Ref Range Status   Specimen Description URINE, RANDOM  Final   Special Requests NONE  Final   Culture NO GROWTH  Final   Report Status 01/02/2017 FINAL  Final         Radiology Studies: Dg Abdomen Acute W/chest  Result Date: 12/31/2016 CLINICAL DATA:  83 year old presenting with generalized weakness and constipation.  EXAM: DG ABDOMEN ACUTE W/ 1V CHEST COMPARISON:  CT abdomen and pelvis 09/19/2004. Chest x-rays 08/05/2016, 04/14/2016 and earlier. FINDINGS: Gas within normal-caliber loops of small bowel throughout the abdomen and pelvis. Gas and expected stool burden throughout normal caliber colon. No visible opaque urinary tract calculi. Surgical clips in the right upper quadrant from prior cholecystectomy. No evidence of free intraperitoneal air or significant air-fluid levels on the erect image. Aortoiliofemoral atherosclerosis. Lumbar dextroscoliosis. Severe degenerative changes throughout the lumbar spine. Severe osseous demineralization. Cardiac silhouette moderately enlarged for AP technique, increased in size since 2017. Thoracic aorta atherosclerotic, unchanged. Large hiatal hernia, increased in size. Paratracheal opacity in the superior mediastinum is likely related to the combination of thyroid gland enlargement and mediastinal fat better seen on the AP image. Lungs clear. Bronchovascular markings normal. Pulmonary vascularity normal. No visible pleural effusions. No pneumothorax. IMPRESSION: 1. No acute abdominal abnormality. 2. Moderate cardiomegaly, with interval increase in heart size since November, 2017. 3.  No acute cardiopulmonary disease. 4.  Large hiatal hernia. Electronically Signed   By: Evangeline Dakin M.D.   On: 12/31/2016 19:31        Scheduled Meds: . busPIRone  30 mg Oral Daily  . cefTRIAXone (ROCEPHIN)  IV  1 g Intravenous Q24H  . cloNIDine  0.1 mg Oral Daily  . FLUoxetine  40 mg Oral Daily  . heparin  5,000 Units Subcutaneous Q8H  . lidocaine  2 patch Transdermal Q24H  . methylPREDNISolone  4 mg Oral Daily  . mirtazapine  45 mg Oral QHS  . multivitamin with minerals  1 tablet Oral Daily  . pantoprazole  40 mg Oral Daily  . polyethylene glycol  17 g Oral Daily  . simvastatin  10 mg Oral QHS  . sodium chloride flush  3 mL Intravenous Q12H  . tamsulosin  0.4 mg Oral Daily   Continuous Infusions:   LOS: 2 days    Time spent: 75min    Domenic Polite, MD [facility] Pager [phone]  If 7PM-7AM, please contact night-coverage [email or web address] Password [facility] 01/02/2017, 11:39 AM

## 2017-01-02 NOTE — Progress Notes (Signed)
Patient requesting to speak to CSW for placement decision. Message relayed to [place], Judson Roch.   Pt otherwise comfortable, somewhat forgetful.

## 2017-01-02 NOTE — Consult Note (Signed)
   [facility] CM Inpatient Consult   01/02/2017  Peter Wolfe Jan 04, 1929 [id number]  Follow up:  Requested to follow up with this patient who is contemplating a skilled facility stay post hospitalization per inpatient social worker, Judson Roch.  Met with patient who is concerned about his transition and potential need for assisted living verses senior living.  Patient is worried about how he will get his financial and living arrangements assisted.  Explained how [facility] social worker can assist in discharge planning from the rehab facility with resources and assistance. Patient anxious because his immediate family is in [place], his niece [place]. He is concerned with getting his mail and  bills paid.  Explored that his friends in this area could assist with getting mail to him and he also states he could follow up with the post office.  He states he would like to talk further with the inpatient social worker regarding his coverage and to see if his [employer] benefits can be accessed. Will inform Judson Roch of patient's request.  Consent form signed for [facility] services.  Will follow up for disposition destination and post hospital needs.  For questions, please contact:  Natividad Brood, RN BSN [facility] Liaison  [phone] business mobile phone Toll free office [phone]

## 2017-01-03 DIAGNOSIS — S22072A Unstable burst fracture of T9-T10 vertebra, initial encounter for closed fracture: Secondary | ICD-10-CM

## 2017-01-03 LAB — CBC AND DIFFERENTIAL
HEMATOCRIT: 25 % — AB (ref 41–53)
HEMOGLOBIN: 8.4 g/dL — AB (ref 13.5–17.5)
PLATELETS: 136 10*3/uL — AB (ref 150–399)
WBC: 5.6 10*3/mL

## 2017-01-03 LAB — CBC
HCT: 25 % — ABNORMAL LOW (ref 39.0–52.0)
Hemoglobin: 8.4 g/dL — ABNORMAL LOW (ref 13.0–17.0)
MCH: 33.1 pg (ref 26.0–34.0)
MCHC: 33.6 g/dL (ref 30.0–36.0)
MCV: 98.4 fL (ref 78.0–100.0)
Platelets: 136 10*3/uL — ABNORMAL LOW (ref 150–400)
RBC: 2.54 MIL/uL — ABNORMAL LOW (ref 4.22–5.81)
RDW: 14.2 % (ref 11.5–15.5)
WBC: 5.6 10*3/uL (ref 4.0–10.5)

## 2017-01-03 LAB — BASIC METABOLIC PANEL
Anion gap: 9 (ref 5–15)
BUN: 22 mg/dL — AB (ref 4–21)
BUN: 22 mg/dL — AB (ref 6–20)
CALCIUM: 8.7 mg/dL — AB (ref 8.9–10.3)
CO2: 25 mmol/L (ref 22–32)
Chloride: 99 mmol/L — ABNORMAL LOW (ref 101–111)
Creatinine, Ser: 1.52 mg/dL — ABNORMAL HIGH (ref 0.61–1.24)
Creatinine: 1.5 mg/dL — AB (ref 0.6–1.3)
GFR calc Af Amer: 46 mL/min — ABNORMAL LOW (ref >60)
GFR, EST NON AFRICAN AMERICAN: 40 mL/min — AB (ref >60)
GLUCOSE: 106 mg/dL — AB (ref 65–99)
GLUCOSE: 106 mg/dL
Potassium: 3.5 mmol/L (ref 3.4–5.3)
Potassium: 3.5 mmol/L (ref 3.5–5.1)
Sodium: 133 mmol/L — AB (ref 137–147)
Sodium: 133 mmol/L — ABNORMAL LOW (ref 135–145)

## 2017-01-03 MED ORDER — METHYLPREDNISOLONE 2 MG PO TABS
2.0000 mg | ORAL_TABLET | Freq: Every day | ORAL | Status: DC
  Administered 2017-01-04: 2 mg via ORAL
  Filled 2017-01-03 (×4): qty 1

## 2017-01-03 MED ORDER — FLEET ENEMA 7-19 GM/118ML RE ENEM
1.0000 | ENEMA | Freq: Once | RECTAL | Status: AC
  Administered 2017-01-03: 1 via RECTAL
  Filled 2017-01-03: qty 1

## 2017-01-03 NOTE — Progress Notes (Signed)
Patient did not have any results with Dulcolax, requested Fleets.  Fleets administered with good results.

## 2017-01-03 NOTE — Progress Notes (Signed)
Patient c/o constipation, states this is a chronic problem and would like a suppository.  Dulcolax suppository administered.

## 2017-01-03 NOTE — Progress Notes (Signed)
PROGRESS NOTE    Peter Wolfe  XNT:[id number] DOB: Apr 06, 1929 DOA: 12/31/2016 PCP: [name], NP  Brief Narrative:Peter Wolfe is a 87/M with hypertension, GERD, depression, anxiety, and recent fall with thoracic compression fracture and resulting pain who presents to the emergency department with intractable pain in the back and bilateral ribs with inability to get up from bed or care from himself secondary to this. Patient was admitted briefly on 12/29/2016 after suffering a thoracic compression fracture. Neurosurgery was consulted and advised conservative management and the patient was discharged with pain medications. He was also noted to have an acute kidney injury during that admission and his NSAID and diuretics were discontinued at time of discharge. Since returning home, his pain has been on bearable despite his use of the prescribed Norco. Pain is much worse with any movement and he has essentially been bedbound since returning home. He lives alone and hasn't been able to care for himself secondary to this. Chemistry panel features a sodium of 120, potassium 3.3, BUN 24, and serum creatinine 1.51. CBC is notable for a normocytic anemia with hemoglobin of 9.4 and a thrombocytopenia with platelets 122,000. Urinalysis suggests a possible infection.  Assessment & Plan:   1. Hyponatremia, hypovolemic   - Secondary to hypovolemia, free-water intake  -improved with hydration, stopped IVF  2. UTI  - Pt complained of dysuria and urinary urgency/frequency on admission - UA is compatible with infection in this setting; on ceftriaxone Day 3 - continue FLomax, urine Cx negative will stop Abx today  3. Back pain /recent fall with T9 body fracture  - NSG had been consulted at time of recent injury and advised conservative mgmt  - now on medrol 4mg  and taper over 6days, 2 lidocaine patches  - continue PRN hydrocodone  -needs short term rehab, finally agrees with lot of  back and forth  4. Normocytic anemia, thrombocytopenia  - Hgb is 9.4 on admission, now 8.4, down from 10 two days prior  - Platelets stable at 121k  - No evidence for bleeding, will monitor, anemia panel unremarkable too  5. Depression  - Continue Buspar, Remeron, Prozac   6. Hypokalemia  - Serum potassium is 3.3 on admission - replaced  DVT prophylaxis: sq heparin Code Status: Full  Family Communication: Discussed with patient Disposition Plan: SNF in 1-2days  Antimicrobials:   Ceftriaxone   Subjective: Still having back pain, hydrocodone helping, finally agrees to go to SNF  Objective: Vitals:   01/02/17 1251 01/02/17 1946 01/03/17 0520 01/03/17 0950  BP: 140/68 126/72 (!) 146/78 (!) 153/74  Pulse: 85 82 81 80  Resp: 18 18 18    Temp: 97.8 F (36.6 C) 98.1 F (36.7 C) 98.2 F (36.8 C)   TempSrc: Oral Oral Oral   SpO2: 97% 94% 100% 94%  Weight:   63.1 kg (139 lb 3.2 oz)   Height:        Intake/Output Summary (Last 24 hours) at 01/03/17 1119 Last data filed at 01/03/17 0859  Gross per 24 hour  Intake              650 ml  Output              775 ml  Net             -125 ml   Filed Weights   01/01/17 0700 01/02/17 0442 01/03/17 0520  Weight: 62.1 kg (137 lb) 62.3 kg (137 lb 6.4 oz) 63.1 kg (139 lb  3.2 oz)    Examination:  General exam: Appears calm and comfortable  Respiratory system: Clear to auscultation. Respiratory effort normal. Cardiovascular system: S1 & S2 heard, RRR. No JVD, murmurs, rubs, gallops or clicks. No pedal edema. Gastrointestinal system: Abdomen is nondistended, soft and nontender. No organomegaly or masses felt. Normal bowel sounds heard. Central nervous system: Alert and oriented. No focal neurological deficits. Extremities: Symmetric 5 x 5 power. Skin: No rashes, lesions or ulcers Psychiatry: Judgement and insight appear normal. Mood & affect appropriate.     Data Reviewed:   CBC:  Recent Labs Lab 12/29/16 0111  12/29/16 0827 12/31/16 1837 01/01/17 0337 01/03/17 0401  WBC 8.1 8.1 5.7 5.3 5.6  NEUTROABS 5.1  --   --   --   --   HGB 10.5* 10.0* 9.4* 8.9* 8.4*  HCT 29.7* 28.0* 25.7* 25.3* 25.0*  MCV 97.1 94.9 93.1 94.1 98.4  PLT 126* 121* 122* 122* 341*   Basic Metabolic Panel:  Recent Labs Lab 12/31/16 1837 12/31/16 2328 01/01/17 0337 01/02/17 0737 01/03/17 0401  NA 120* 121* 127* 134* 133*  K 3.3* 3.4* 3.5 3.4* 3.5  CL 88* 89* 94* 100* 99*  CO2 20* 22 25 25 25   GLUCOSE 97 96 74 85 106*  BUN 24* 21* 19 20 22*  CREATININE 1.51* 1.40* 1.38* 1.73* 1.52*  CALCIUM 8.8* 8.3* 8.7* 8.7* 8.7*   GFR: Estimated Creatinine Clearance: 31.7 mL/min (A) (by C-G formula based on SCr of 1.52 mg/dL (H)). Liver Function Tests:  Recent Labs Lab 12/29/16 0827  AST 19  ALT 17  ALKPHOS 77  BILITOT 1.2  PROT 9.6*  ALBUMIN 3.3*   No results for input(s): LIPASE, AMYLASE in the last 168 hours. No results for input(s): AMMONIA in the last 168 hours. Coagulation Profile: No results for input(s): INR, PROTIME in the last 168 hours. Cardiac Enzymes:  Recent Labs Lab 12/29/16 0827  CKTOTAL 46*   BNP (last 3 results) No results for input(s): PROBNP in the last 8760 hours. HbA1C: No results for input(s): HGBA1C in the last 72 hours. CBG:  Recent Labs Lab 01/01/17 0753 01/01/17 0854 01/02/17 0724  GLUCAP 65 78 89   Lipid Profile: No results for input(s): CHOL, HDL, LDLCALC, TRIG, CHOLHDL, LDLDIRECT in the last 72 hours. Thyroid Function Tests:  Recent Labs  01/01/17 0337  TSH 2.857   Anemia Panel:  Recent Labs  12/31/16 2328  VITAMINB12 502  FOLATE 26.6  FERRITIN 234  TIBC 175*  IRON 59  RETICCTPCT 1.1   Urine analysis:    Component Value Date/Time   COLORURINE YELLOW 12/31/2016 1837   APPEARANCEUR HAZY (A) 12/31/2016 1837   LABSPEC 1.016 12/31/2016 1837   PHURINE 6.0 12/31/2016 1837   GLUCOSEU NEGATIVE 12/31/2016 1837   HGBUR NEGATIVE 12/31/2016 1837   HGBUR  negative 06/13/2010 0000   BILIRUBINUR NEGATIVE 12/31/2016 1837   BILIRUBINUR n 07/31/2015 1224   KETONESUR 5 (A) 12/31/2016 1837   PROTEINUR NEGATIVE 12/31/2016 1837   UROBILINOGEN 1.0 07/31/2015 1224   UROBILINOGEN 2.0 06/13/2010 0000   NITRITE NEGATIVE 12/31/2016 1837   LEUKOCYTESUR TRACE (A) 12/31/2016 1837   Sepsis Labs: @LABRCNTIP (procalcitonin:4,lacticidven:4)  ) Recent Results (from the past 240 hour(s))  Urine culture     Status: None   Collection Time: 12/31/16  6:37 PM  Result Value Ref Range Status   Specimen Description URINE, RANDOM  Final   Special Requests NONE  Final   Culture NO GROWTH  Final   Report Status 01/02/2017  FINAL  Final         Radiology Studies: No results found.      Scheduled Meds: . busPIRone  30 mg Oral Daily  . cloNIDine  0.1 mg Oral Daily  . FLUoxetine  40 mg Oral Daily  . heparin  5,000 Units Subcutaneous Q8H  . lidocaine  2 patch Transdermal Q24H  . methylPREDNISolone  4 mg Oral Daily  . mirtazapine  45 mg Oral QHS  . multivitamin with minerals  1 tablet Oral Daily  . pantoprazole  40 mg Oral Daily  . polyethylene glycol  17 g Oral Daily  . simvastatin  10 mg Oral QHS  . sodium chloride flush  3 mL Intravenous Q12H  . tamsulosin  0.4 mg Oral Daily   Continuous Infusions:   LOS: 3 days    Time spent: 1min    Domenic Polite, MD [facility] Pager [phone]  If 7PM-7AM, please contact night-coverage [email or web address] Password [email or web address] 01/03/2017, 11:19 AM

## 2017-01-03 NOTE — Progress Notes (Signed)
Patient stable during7 a to 7 p shift, continues to c/o pain, received po Norco x 2 then 1 mg of Morphine once during shift.  Patient alert and oriented, forgetful at times but no real confusion.  Patient walked to bathroom x 2, refuses to wear his brace.

## 2017-01-04 LAB — CBC
HEMATOCRIT: 25.7 % — AB (ref 39.0–52.0)
HEMOGLOBIN: 8.5 g/dL — AB (ref 13.0–17.0)
MCH: 32.6 pg (ref 26.0–34.0)
MCHC: 33.1 g/dL (ref 30.0–36.0)
MCV: 98.5 fL (ref 78.0–100.0)
Platelets: 152 10*3/uL (ref 150–400)
RBC: 2.61 MIL/uL — AB (ref 4.22–5.81)
RDW: 14.3 % (ref 11.5–15.5)
WBC: 4.9 10*3/uL (ref 4.0–10.5)

## 2017-01-04 LAB — BASIC METABOLIC PANEL
ANION GAP: 6 (ref 5–15)
BUN: 19 mg/dL (ref 6–20)
BUN: 19 mg/dL (ref 4–21)
CO2: 27 mmol/L (ref 22–32)
CREATININE: 1.4 mg/dL — AB (ref 0.6–1.3)
Calcium: 8.7 mg/dL — ABNORMAL LOW (ref 8.9–10.3)
Chloride: 101 mmol/L (ref 101–111)
Creatinine, Ser: 1.4 mg/dL — ABNORMAL HIGH (ref 0.61–1.24)
GFR, EST AFRICAN AMERICAN: 51 mL/min — AB (ref >60)
GFR, EST NON AFRICAN AMERICAN: 44 mL/min — AB (ref >60)
GLUCOSE: 89 mg/dL (ref 65–99)
Glucose: 89 mg/dL
POTASSIUM: 3.3 mmol/L — AB (ref 3.5–5.1)
SODIUM: 134 mmol/L — AB (ref 137–147)
Sodium: 134 mmol/L — ABNORMAL LOW (ref 135–145)

## 2017-01-04 LAB — GLUCOSE, CAPILLARY: Glucose-Capillary: 92 mg/dL (ref 65–99)

## 2017-01-04 LAB — CBC AND DIFFERENTIAL: WBC: 4.9 10^3/mL

## 2017-01-04 MED ORDER — CALCIUM CARBONATE ANTACID 500 MG PO CHEW
1.0000 | CHEWABLE_TABLET | Freq: Two times a day (BID) | ORAL | Status: DC | PRN
  Administered 2017-01-04: 200 mg via ORAL
  Filled 2017-01-04: qty 1

## 2017-01-04 MED ORDER — POTASSIUM CHLORIDE CRYS ER 20 MEQ PO TBCR
40.0000 meq | EXTENDED_RELEASE_TABLET | Freq: Once | ORAL | Status: AC
  Administered 2017-01-04: 40 meq via ORAL
  Filled 2017-01-04: qty 2

## 2017-01-04 NOTE — Progress Notes (Signed)
PROGRESS NOTE    Peter Wolfe Wolfe  FUX:[id number] DOB: [birth year] DOA: 12/31/2016 PCP: [name], NP  Brief Narrative:Peter Wolfe is a 87/M with hypertension, GERD, depression, anxiety, and recent fall with thoracic compression fracture and resulting pain who presents to the emergency department with intractable pain in the back and bilateral ribs with inability to get up from bed or care from himself secondary to this. Patient was admitted briefly on 12/29/2016 after suffering a thoracic compression fracture. Neurosurgery was consulted and advised conservative management and the patient was discharged with pain medications. He was also noted to have an acute kidney injury during that admission and his NSAID and diuretics were discontinued at time of discharge. Since returning home, his pain has been on bearable despite his use of the prescribed Norco. Pain is much worse with any movement and he has essentially been bedbound since returning home. He lives alone and hasn't been able to care for himself secondary to this. Chemistry panel features a sodium of 120, potassium 3.3, BUN 24, and serum creatinine 1.51. CBC is notable for a normocytic anemia with hemoglobin of 9.4 and a thrombocytopenia with platelets 122,000. Urinalysis suggests a possible infection.  Assessment & Plan:   1. Hyponatremia, hypovolemic   - Secondary to hypovolemia, free-water intake  -improved with hydration, stopped IVF  2. UTI  - Pt complained of dysuria and urinary urgency/frequency on admission - UA is compatible with infection in this setting; on ceftriaxone Day 3 - continue FLomax, urine Cx negative stopped Abx  3. Back pain /recent fall with T9 body fracture  - NSG had been consulted at time of recent injury and advised conservative mgmt  - now on medrol 4mg  and taper over 6days, 2 lidocaine patches  - continue PRN hydrocodone  -needs short term rehab, finally agrees after a lot of back and  forth  4. Normocytic anemia, thrombocytopenia  - Hgb is 9.4 on admission, now 8.5, down from 10 two days prior  - Platelets stable at 121k  - No evidence for bleeding, will monitor, anemia panel unremarkable too  5. Depression  - Continue Buspar, Remeron, Prozac   6. Hypokalemia  - Serum potassium is 3.3 on admission - replaced  DVT prophylaxis: sq heparin Code Status: Full  Family Communication: Discussed with patient Disposition Plan: SNF tomorrow if stable  Antimicrobials:   Ceftriaxone   Subjective: Still having back pain, hydrocodone helping, got some morphine last pm  Objective: Vitals:   01/03/17 1228 01/03/17 2019 01/04/17 0537 01/04/17 1212  BP: (!) 148/83 (!) 161/87 (!) 152/73 (!) 164/87  Pulse: 73 76 79 68  Resp: 18 18 18 20   Temp: 97.9 F (36.6 C) 98.1 F (36.7 C) 98.2 F (36.8 C) 97.6 F (36.4 C)  TempSrc: Oral Oral Oral Oral  SpO2: 98% 96% 98% 94%  Weight:   61.8 kg (136 lb 3.2 oz)   Height:        Intake/Output Summary (Last 24 hours) at 01/04/17 1255 Last data filed at 01/04/17 1012  Gross per 24 hour  Intake              783 ml  Output              500 ml  Net              283 ml   Filed Weights   01/02/17 0442 01/03/17 0520 01/04/17 0537  Weight: 62.3 kg (137 lb 6.4 oz) 63.1 kg (139  lb 3.2 oz) 61.8 kg (136 lb 3.2 oz)    Examination:  General exam: Appears calm and comfortable, no distress Respiratory system: Clear to auscultation. Cardiovascular system: S1 & S2 heard, RRR. No JVD, murmurs No pedal edema. Gastrointestinal system: Abdomen is nondistended, soft and nontender.  Normal bowel sounds heard. Central nervous system: Alert and oriented. No focal neurological deficits. Extremities: Symmetric 5 x 5 power. Skin: No rashes, lesions or ulcers Psychiatry: Judgement and insight appear normal. Mood & affect appropriate.     Data Reviewed:   CBC:  Recent Labs Lab 12/29/16 0111 12/29/16 0827 12/31/16 1837 01/01/17 0337  01/03/17 0401 01/04/17 0248  WBC 8.1 8.1 5.7 5.3 5.6 4.9  NEUTROABS 5.1  --   --   --   --   --   HGB 10.5* 10.0* 9.4* 8.9* 8.4* 8.5*  HCT 29.7* 28.0* 25.7* 25.3* 25.0* 25.7*  MCV 97.1 94.9 93.1 94.1 98.4 98.5  PLT 126* 121* 122* 122* 136* 882   Basic Metabolic Panel:  Recent Labs Lab 12/31/16 2328 01/01/17 0337 01/02/17 0737 01/03/17 0401 01/04/17 0248  NA 121* 127* 134* 133* 134*  K 3.4* 3.5 3.4* 3.5 3.3*  CL 89* 94* 100* 99* 101  CO2 22 25 25 25 27   GLUCOSE 96 74 85 106* 89  BUN 21* 19 20 22* 19  CREATININE 1.40* 1.38* 1.73* 1.52* 1.40*  CALCIUM 8.3* 8.7* 8.7* 8.7* 8.7*   GFR: Estimated Creatinine Clearance: 33.7 mL/min (A) (by C-G formula based on SCr of 1.4 mg/dL (H)). Liver Function Tests:  Recent Labs Lab 12/29/16 0827  AST 19  ALT 17  ALKPHOS 77  BILITOT 1.2  PROT 9.6*  ALBUMIN 3.3*   No results for input(s): LIPASE, AMYLASE in the last 168 hours. No results for input(s): AMMONIA in the last 168 hours. Coagulation Profile: No results for input(s): INR, PROTIME in the last 168 hours. Cardiac Enzymes:  Recent Labs Lab 12/29/16 0827  CKTOTAL 46*   BNP (last 3 results) No results for input(s): PROBNP in the last 8760 hours. HbA1C: No results for input(s): HGBA1C in the last 72 hours. CBG:  Recent Labs Lab 01/01/17 0753 01/01/17 0854 01/02/17 0724 01/04/17 0726  GLUCAP 65 78 89 92   Lipid Profile: No results for input(s): CHOL, HDL, LDLCALC, TRIG, CHOLHDL, LDLDIRECT in the last 72 hours. Thyroid Function Tests: No results for input(s): TSH, T4TOTAL, FREET4, T3FREE, THYROIDAB in the last 72 hours. Anemia Panel: No results for input(s): VITAMINB12, FOLATE, FERRITIN, TIBC, IRON, RETICCTPCT in the last 72 hours. Urine analysis:    Component Value Date/Time   COLORURINE YELLOW 12/31/2016 1837   APPEARANCEUR HAZY (A) 12/31/2016 1837   LABSPEC 1.016 12/31/2016 1837   PHURINE 6.0 12/31/2016 1837   GLUCOSEU NEGATIVE 12/31/2016 1837   HGBUR  NEGATIVE 12/31/2016 1837   HGBUR negative 06/13/2010 0000   BILIRUBINUR NEGATIVE 12/31/2016 1837   BILIRUBINUR n 07/31/2015 1224   KETONESUR 5 (A) 12/31/2016 1837   PROTEINUR NEGATIVE 12/31/2016 1837   UROBILINOGEN 1.0 07/31/2015 1224   UROBILINOGEN 2.0 06/13/2010 0000   NITRITE NEGATIVE 12/31/2016 1837   LEUKOCYTESUR TRACE (A) 12/31/2016 1837   Sepsis Labs: @LABRCNTIP (procalcitonin:4,lacticidven:4)  ) Recent Results (from the past 240 hour(s))  Urine culture     Status: None   Collection Time: 12/31/16  6:37 PM  Result Value Ref Range Status   Specimen Description URINE, RANDOM  Final   Special Requests NONE  Final   Culture NO GROWTH  Final   Report  Status 01/02/2017 FINAL  Final         Radiology Studies: No results found.      Scheduled Meds: . busPIRone  30 mg Oral Daily  . cloNIDine  0.1 mg Oral Daily  . FLUoxetine  40 mg Oral Daily  . heparin  5,000 Units Subcutaneous Q8H  . lidocaine  2 patch Transdermal Q24H  . methylPREDNISolone  2 mg Oral Daily  . mirtazapine  45 mg Oral QHS  . multivitamin with minerals  1 tablet Oral Daily  . pantoprazole  40 mg Oral Daily  . polyethylene glycol  17 g Oral Daily  . simvastatin  10 mg Oral QHS  . sodium chloride flush  3 mL Intravenous Q12H  . tamsulosin  0.4 mg Oral Daily   Continuous Infusions:   LOS: 4 days    Time spent: 4min    Domenic Polite, MD [facility] Pager [phone]  If 7PM-7AM, please contact night-coverage [email or web address] Password [facility] 01/04/2017, 12:55 PM

## 2017-01-05 ENCOUNTER — Other Ambulatory Visit: Payer: Self-pay | Admitting: Licensed Clinical Social Worker

## 2017-01-05 DIAGNOSIS — F3341 Major depressive disorder, recurrent, in partial remission: Secondary | ICD-10-CM | POA: Diagnosis not present

## 2017-01-05 DIAGNOSIS — I10 Essential (primary) hypertension: Secondary | ICD-10-CM | POA: Diagnosis not present

## 2017-01-05 DIAGNOSIS — M549 Dorsalgia, unspecified: Secondary | ICD-10-CM | POA: Diagnosis not present

## 2017-01-05 DIAGNOSIS — R262 Difficulty in walking, not elsewhere classified: Secondary | ICD-10-CM | POA: Diagnosis not present

## 2017-01-05 DIAGNOSIS — E860 Dehydration: Secondary | ICD-10-CM | POA: Diagnosis not present

## 2017-01-05 DIAGNOSIS — M8580 Other specified disorders of bone density and structure, unspecified site: Secondary | ICD-10-CM | POA: Diagnosis not present

## 2017-01-05 DIAGNOSIS — E785 Hyperlipidemia, unspecified: Secondary | ICD-10-CM | POA: Diagnosis not present

## 2017-01-05 DIAGNOSIS — R2681 Unsteadiness on feet: Secondary | ICD-10-CM | POA: Diagnosis not present

## 2017-01-05 DIAGNOSIS — E876 Hypokalemia: Secondary | ICD-10-CM | POA: Diagnosis not present

## 2017-01-05 DIAGNOSIS — F4320 Adjustment disorder, unspecified: Secondary | ICD-10-CM | POA: Diagnosis not present

## 2017-01-05 DIAGNOSIS — S22071A Stable burst fracture of T9-T10 vertebra, initial encounter for closed fracture: Secondary | ICD-10-CM | POA: Diagnosis not present

## 2017-01-05 DIAGNOSIS — M6281 Muscle weakness (generalized): Secondary | ICD-10-CM | POA: Diagnosis not present

## 2017-01-05 DIAGNOSIS — J449 Chronic obstructive pulmonary disease, unspecified: Secondary | ICD-10-CM | POA: Diagnosis not present

## 2017-01-05 DIAGNOSIS — D649 Anemia, unspecified: Secondary | ICD-10-CM | POA: Diagnosis not present

## 2017-01-05 DIAGNOSIS — S22072A Unstable burst fracture of T9-T10 vertebra, initial encounter for closed fracture: Secondary | ICD-10-CM | POA: Diagnosis not present

## 2017-01-05 DIAGNOSIS — G4700 Insomnia, unspecified: Secondary | ICD-10-CM | POA: Diagnosis not present

## 2017-01-05 DIAGNOSIS — F329 Major depressive disorder, single episode, unspecified: Secondary | ICD-10-CM | POA: Diagnosis not present

## 2017-01-05 DIAGNOSIS — F419 Anxiety disorder, unspecified: Secondary | ICD-10-CM | POA: Diagnosis not present

## 2017-01-05 DIAGNOSIS — N400 Enlarged prostate without lower urinary tract symptoms: Secondary | ICD-10-CM | POA: Diagnosis not present

## 2017-01-05 DIAGNOSIS — S22009D Unspecified fracture of unspecified thoracic vertebra, subsequent encounter for fracture with routine healing: Secondary | ICD-10-CM | POA: Diagnosis not present

## 2017-01-05 DIAGNOSIS — M546 Pain in thoracic spine: Secondary | ICD-10-CM | POA: Diagnosis not present

## 2017-01-05 DIAGNOSIS — S12100A Unspecified displaced fracture of second cervical vertebra, initial encounter for closed fracture: Secondary | ICD-10-CM | POA: Diagnosis not present

## 2017-01-05 DIAGNOSIS — Z9181 History of falling: Secondary | ICD-10-CM | POA: Diagnosis not present

## 2017-01-05 DIAGNOSIS — E871 Hypo-osmolality and hyponatremia: Secondary | ICD-10-CM | POA: Diagnosis not present

## 2017-01-05 DIAGNOSIS — R488 Other symbolic dysfunctions: Secondary | ICD-10-CM | POA: Diagnosis not present

## 2017-01-05 DIAGNOSIS — K219 Gastro-esophageal reflux disease without esophagitis: Secondary | ICD-10-CM | POA: Diagnosis not present

## 2017-01-05 DIAGNOSIS — N183 Chronic kidney disease, stage 3 (moderate): Secondary | ICD-10-CM | POA: Diagnosis not present

## 2017-01-05 DIAGNOSIS — N390 Urinary tract infection, site not specified: Secondary | ICD-10-CM | POA: Diagnosis not present

## 2017-01-05 DIAGNOSIS — D696 Thrombocytopenia, unspecified: Secondary | ICD-10-CM | POA: Diagnosis not present

## 2017-01-05 DIAGNOSIS — S22079D Unspecified fracture of T9-T10 vertebra, subsequent encounter for fracture with routine healing: Secondary | ICD-10-CM | POA: Diagnosis not present

## 2017-01-05 LAB — GLUCOSE, CAPILLARY: Glucose-Capillary: 95 mg/dL (ref 65–99)

## 2017-01-05 MED ORDER — LIDOCAINE 5 % EX PTCH: 2.0000 | MEDICATED_PATCH | CUTANEOUS | 0 refills | Status: DC

## 2017-01-05 MED ORDER — METHYLPREDNISOLONE 2 MG PO TABS: 2.0000 mg | ORAL_TABLET | Freq: Every day | ORAL | Status: DC

## 2017-01-05 MED ORDER — SENNA 8.6 MG PO TABS: 2.0000 | ORAL_TABLET | Freq: Two times a day (BID) | ORAL | 0 refills | Status: DC

## 2017-01-05 MED ORDER — BISACODYL 10 MG RE SUPP: 10.0000 mg | Freq: Every day | RECTAL | 0 refills | Status: DC | PRN

## 2017-01-05 MED ORDER — HYDROCODONE-ACETAMINOPHEN 5-325 MG PO TABS: 1.0000 | ORAL_TABLET | Freq: Four times a day (QID) | ORAL | 0 refills | Status: DC | PRN

## 2017-01-05 MED ORDER — OMEPRAZOLE 40 MG PO CPDR: 40.0000 mg | DELAYED_RELEASE_CAPSULE | Freq: Every day | ORAL | Status: AC

## 2017-01-05 NOTE — Care Management Important Message (Signed)
Important Message  Patient Details  Name: Peter Wolfe MRN: [id number] Date of Birth: 1929-09-26   Medicare Important Message Given:  Yes    [place] [address] 01/05/2017, 12:32 PM

## 2017-01-05 NOTE — Patient Outreach (Addendum)
[facility]) Care Management  01/05/2017  Fong Mccarry Yaworski III 09/12/1929 [id number]   Assessment- CSW has not discharged from inpatient yet. CSW will continue to follow case and await for discharge in order to complete SNF visit. Patient has chosen [facility].  Plan-CSW will follow case closely and await hospital discharge.  Eula Fried, BSW, MSW, [email or web address] Phone: [phone] Fax: [phone]

## 2017-01-05 NOTE — Progress Notes (Signed)
Clinical Social Worker facilitated patient discharge including contacting patient family and facility to confirm patient discharge plans.  Clinical information faxed to facility and family agreeable with plan.  CSW arranged ambulance transport via [facility] to [facility] .  RN Tiffany to call [phone] report prior to discharge.  Clinical Social Worker will sign off for now as social work intervention is no longer needed. Please consult [place] again if new need arises.  Rhea Pink, MSW, [place]

## 2017-01-05 NOTE — Progress Notes (Signed)
Pt has been c/o tremors for about a week, pt believes may have contributed to his fall, will continue to monitor, Thanks [place].

## 2017-01-05 NOTE — Progress Notes (Signed)
qPhysical Therapy Treatment Patient Details Name: Peter Wolfe MRN: [id number] DOB: [birth year] Today's Date: 01/05/2017    History of Present Illness Pt is an 83 y/o male admitted with intractable back pain. Pt recently admitted and d/c'd secondary to sustaining a fall with T9 compression fx on 12/29/16. Pt also found to have a UTI, anemia, hyponatremia and hypokalemia. Pt was sent home with a TLSO to be donned in supine per chart review. PMH: COPD, anxiety/depression, HTN, CKD.    PT Comments    Pt ambulated 180' with RW and min A +2 for safety due to pt's LE's become weaker with distance with increased knee flexion and decreased safety. Pt concerned about tremor, mentioned that his physician recommended he see a neurologist and he had not done so. Encouraged him to follow this rec. PT will continue to follow.    Follow Up Recommendations  SNF     Equipment Recommendations  None recommended by PT    Recommendations for Other Services OT consult     Precautions / Restrictions Precautions Precautions: Fall;Back Precaution Comments: review back precautions throughout mobility Required Braces or Orthoses: Spinal Brace Spinal Brace: Thoracolumbosacral orthotic Spinal Brace Comments: pt reports the TLSO was too tight and made it hard to breathe Restrictions Weight Bearing Restrictions: No    Mobility  Bed Mobility Overal bed mobility: Needs Assistance Bed Mobility: Rolling;Sidelying to Sit Rolling: Supervision Sidelying to sit: Supervision       General bed mobility comments: with vc's for log rolling, pt able to roll over and get to EOB with use of rail  Transfers Overall transfer level: Needs assistance Equipment used: Rolling walker (2 wheeled) Transfers: Sit to/from Stand Sit to Stand: +2 safety/equipment;Min assist         General transfer comment: min A to steady and for last 10 deg knee extension  Ambulation/Gait Ambulation/Gait assistance: +2  safety/equipment;Min assist Ambulation Distance (Feet): 180 Feet Assistive device: Rolling walker (2 wheeled) Gait Pattern/deviations: Step-through pattern;Shuffle;Trunk flexed Gait velocity: decreased Gait velocity interpretation: <1.8 ft/sec, indicative of risk for recurrent falls General Gait Details: pt with increased B knee flexion last 50' and pt reports being limited by LE weakness.    Stairs            Wheelchair Mobility    Modified Rankin (Stroke Patients Only)       Balance Overall balance assessment: History of Falls;Needs assistance Sitting-balance support: No upper extremity supported;Feet supported Sitting balance-Leahy Scale: Fair     Standing balance support: Bilateral upper extremity supported;During functional activity Standing balance-Leahy Scale: Poor Standing balance comment: reliant on RW or external surface in standing                            Cognition Arousal/Alertness: Awake/alert Behavior During Therapy: WFL for tasks assessed/performed Overall Cognitive Status: Impaired/Different from baseline Area of Impairment: Orientation;Memory;Following commands;Safety/judgement;Problem solving                 Orientation Level: Disoriented to;Time;Situation   Memory: Decreased short-term memory;Decreased recall of precautions Following Commands: Follows one step commands consistently;Follows multi-step commands with increased time Safety/Judgement: Decreased awareness of safety;Decreased awareness of deficits   Problem Solving: Slow processing;Decreased initiation;Difficulty sequencing;Requires verbal cues;Requires tactile cues        Exercises General Exercises - Lower Extremity Ankle Circles/Pumps: AROM;Both;10 reps;Seated Long Arc Quad: AROM;Both;10 reps;Seated Heel Slides: AROM;Both;10 reps;Seated Hip Flexion/Marching: AROM;10 reps;Standing Heel Raises: Limitations Heel Raises Limitations: unable  due to gastroc  weakness    General Comments        Pertinent Vitals/Pain Pain Assessment: Faces Faces Pain Scale: Hurts even more Pain Location: back Pain Descriptors / Indicators: Sore Pain Intervention(s): Limited activity within patient's tolerance;Monitored during session    Home Living                      Prior Function            PT Goals (current goals can now be found in the care plan section) Acute Rehab PT Goals Patient Stated Goal: to go home and take care of my bills PT Goal Formulation: With patient Time For Goal Achievement: 01/15/17 Potential to Achieve Goals: Good Progress towards PT goals: Progressing toward goals    Frequency    Min 3X/week      PT Plan Current plan remains appropriate    Co-evaluation             End of Session Equipment Utilized During Treatment: Gait belt Activity Tolerance: Patient tolerated treatment well Patient left: in chair;with chair alarm set;with call bell/phone within reach Nurse Communication: Mobility status PT Visit Diagnosis: History of falling (Z91.81);Difficulty in walking, not elsewhere classified (R26.2);Pain Pain - part of body:  (back)     Time: 0539-7673 PT Time Calculation (min) (ACUTE ONLY): 33 min  Charges:  $Gait Training: 8-22 mins $Therapeutic Exercise: 8-22 mins                    G Codes:       Leighton Roach, PT  Acute Rehab Services  [place] 01/05/2017, 12:22 PM

## 2017-01-05 NOTE — Discharge Summary (Addendum)
Physician Discharge Summary  Peter Wolfe MVH:[id number] DOB: [birth year] DOA: 12/31/2016  PCP: [name], NP  Admit date: 12/31/2016 Discharge date: 01/05/2017  Time spent: 35 minutes  Recommendations for Outpatient Follow-up:  1. PCP at SNF in 2-3days, please stop Low dose Medrol in 4days 2. Caution pt suffers from chronic constipation 3.  SNF for short term rehab after that would benefit from living in an Assisted Living facility  Discharge Diagnoses:  Principal Problem:   Hyponatremia   T9 compression fracture   Chronic constipation   Back back radiating to sides   GERD   Essential hypertension, benign   Depression   Back pain   CKD (chronic kidney disease), stage Wolfe   Hypokalemia   Normocytic anemia   Acute lower UTI   Discharge Condition: stable  Diet recommendation: heart healthy  Filed Weights   01/03/17 0520 01/04/17 0537 01/05/17 0505  Weight: 63.1 kg (139 lb 3.2 oz) 61.8 kg (136 lb 3.2 oz) 63.8 kg (140 lb 9.6 oz)    History of present illness:  Peter Low IIIis a 83/M with hypertension, GERD, depression, anxiety, and recent fall with thoracic compression fracture and resulting pain who presents to the emergency department with intractable pain in the back and bilateral ribs with inability to get up from bed or care from himself secondary to this. Patient was admitted briefly on 12/29/2016 after suffering a thoracic compression fracture. Neurosurgery was consulted and advised conservative management and the patient was discharged with pain medications. He was also noted to have an acute kidney injury during that admission and his NSAID and diuretics were discontinued at time of discharge. Since returning home, his pain has been on bearable despite his use of the prescribed Norco, worse with any movement and he has essentially been bedbound since returning home. He lives alone and hasn't been able to care for himself secondary to this. Chemistry  panel noted a sodium of 120, potassium 3.3, BUN 24, and serum creatinine 1.51  Hospital Course:  1. Hyponatremia, hypovolemic  - Secondary to hypovolemia, dehydration -improved with hydration, stopped IVF  2. UTI  - Pt complained of dysuria and urinary urgency/frequency on admission - UA was compatible with infection in this setting;  Treated with IV ceftriaxone for 3days, urine Cx negative stopped Abx -continue flomax for BPH, voiding without difficulty now  3. Back pain /recent fall with T9 body fracture  - Neurosurgery consulted last admission/past week and advised conservative mgmt  - now on medrol 4mg  for 2days then 2mg  for 4days, 2 lidocaine patches - continue PRN hydrocodone, tylenol -PT evaluation completed, needs short term rehab, finally agrees after a lot of back and forth, required a lot of convincing to go to rehab  4. Normocytic anemia, thrombocytopenia  - Hgb is 9.4 on admission, now 8.5, down from 10 two days prior  - No evidence for bleeding, hemoccult negative, resumed PPI, anemia panel unremarkable too -please check CBC in 1 week  5. Depression  - Continue Buspar, Remeron, Prozac   6. Hypokalemia  - Serum potassium is 3.3 on admission - replaced  7. CKD 3 -creatinine stable in 1.5 range   Discharge Exam: Vitals:   01/05/17 0000 01/05/17 0505  BP: 135/61 139/68  Pulse: 79 85  Resp:  18  Temp:  98.4 F (36.9 C)    General: AAOx3 Cardiovascular: S1S2/RRR Respiratory: CTAB  Discharge Instructions   Discharge Instructions    AMB Referral to [facility]  Complete by:  As directed    Reason for consult:  Facility for rehab is planned - patient needs transition assistance and support back to community for potential long term plans   Expected date of contact:  1-3 days (reserved for hospital discharges)   Please assign patient to social worker for post hospital follow up at skilled facility.  Please see notes- patient would like  assistance with exploring his options and community support for transition. Has minimal support and his family is in [place].   Questions please call:   Natividad Brood, RN BSN [facility] Liaison  [phone] business mobile phone Toll free office [phone]   Diet - low sodium heart healthy    Complete by:  As directed    Increase activity slowly    Complete by:  As directed      Current Discharge Medication List    START taking these medications   Details  bisacodyl (DULCOLAX) 10 MG suppository Place 1 suppository (10 mg total) rectally daily as needed for moderate constipation. Qty: 12 suppository, Refills: 0    lidocaine (LIDODERM) 5 % Place 2 patches onto the skin daily. Remove & Discard patch within 12 hours or as directed by MD Qty: 30 patch, Refills: 0    methylPREDNISolone (MEDROL) 2 MG tablet Take 1 tablet (2 mg total) by mouth daily. For 5days then STOP    senna (SENOKOT) 8.6 MG TABS tablet Take 2 tablets (17.2 mg total) by mouth 2 (two) times daily. Refills: 0      CONTINUE these medications which have CHANGED   Details  HYDROcodone-acetaminophen (NORCO/VICODIN) 5-325 MG tablet Take 1 tablet by mouth every 6 (six) hours as needed for moderate pain. Qty: 20 tablet, Refills: 0    omeprazole (PRILOSEC) 40 MG capsule Take 1 capsule (40 mg total) by mouth daily.      CONTINUE these medications which have NOT CHANGED   Details  albuterol (PROVENTIL HFA;VENTOLIN HFA) 108 (90 Base) MCG/ACT inhaler Inhale 2 puffs into the lungs every 6 (six) hours as needed. Qty: 1 Inhaler, Refills: 0    busPIRone (BUSPAR) 30 MG tablet TAKE 1 TABLET DAILY Qty: 100 tablet, Refills: 3    cloNIDine (CATAPRES) 0.1 MG tablet TAKE 1 TABLET DAILY Qty: 100 tablet, Refills: 3    FLUoxetine (PROZAC) 40 MG capsule Take 1 capsule (40 mg total) by mouth daily. Qty: 90 capsule, Refills: 1   Associated Diagnoses: Loss of weight    mirtazapine (REMERON) 45 MG tablet Take  1 tablet (45 mg total) by mouth at bedtime. Qty: 30 tablet, Refills: 3   Associated Diagnoses: Moderate episode of recurrent major depressive disorder ([place]); Sleep disturbance; Loss of weight    Multiple Vitamin (MULTIVITAMIN) capsule Take 1 capsule by mouth daily.    polyethylene glycol (MIRALAX) packet Take 17 g by mouth daily. Qty: 14 each, Refills: 0    simvastatin (ZOCOR) 10 MG tablet TAKE 1 TABLET AT BEDTIME Qty: 100 tablet, Refills: 3    tamsulosin (FLOMAX) 0.4 MG CAPS capsule TAKE 1 CAPSULE DAILY Qty: 100 capsule, Refills: 3      STOP taking these medications     docusate sodium (COLACE) 100 MG capsule        Allergies  Allergen Reactions  . Influenza Virus Vacc Split Pf Other (See Comments)    Allergic to eggs  . Doxycycline Rash  . Eggs Or Egg-Derived Products Rash    Contact information for follow-up providers    [name],  NP. Schedule an appointment as soon as possible for a visit in 1 week(s).   Specialty:  Family Medicine Contact information: [place] [address] [phone]            Contact information for after-discharge care    Destination    [facility] Follow up.   Specialty:  Skilled Nursing Facility Contact information: [address] [place] [phone]                   The results of significant diagnostics from this hospitalization (including imaging, microbiology, ancillary and laboratory) are listed below for reference.    Significant Diagnostic Studies: Dg Ribs Unilateral W/chest Right  Result Date: 12/29/2016 CLINICAL DATA:  [place] on Wednesday getting out of car. History of right rib fractures. Complaining of mid to upper back pain and right-sided generalized rib pain. EXAM: RIGHT RIBS AND CHEST - 3+ VIEW COMPARISON:  None. FINDINGS: Moderate to large hiatal hernia. Aortic atherosclerosis with slight uncoiling. Emphysematous hyperinflation of  the lungs without pneumonic consolidation, hemothorax or pneumothorax. Subacute to chronic appearing right fifth, anterior seventh and chronic appearing anterior tenth and eleventh rib fractures are noted. No acute displaced appearing fracture is visualized. Degenerative disc disease with spurring of the endplates at Y6-5. IMPRESSION: 1. Subacute to chronic right fifth, anterior seventh, tenth and eleventh rib fractures. 2. Moderate to large hiatal hernia. 3. Aortic atherosclerosis. 4. Emphysematous hyperinflation of the lungs. 5. Degenerative disc disease L1-2 with spurring of the endplates. Electronically Signed   By: Ashley Royalty M.D.   On: 12/29/2016 00:15   Dg Thoracic Spine 2 View  Result Date: 12/29/2016 CLINICAL DATA:  Neck pain after fall EXAM: THORACIC SPINE 2 VIEWS COMPARISON:  Lateral chest radiograph from 08/05/2016 FINDINGS: New moderate 50% compression of the T9 vertebral body is noted since prior exam. There appears be some 4 mm of retropulsion of the posterior superior corner of T9. Mild multilevel degenerative disc disease of the dorsal spine. Degenerative disc disease with vacuum disc phenomena noted at T12-L1 and L1-2. IMPRESSION: 50% anterior compression fracture the T9 vertebral body with 4 mm of retropulsion. Electronically Signed   By: Ashley Royalty M.D.   On: 12/29/2016 00:19   Ct Thoracic Spine Wo Contrast  Result Date: 12/29/2016 CLINICAL DATA:  T9 fracture EXAM: CT THORACIC AND LUMBAR SPINE WITHOUT CONTRAST TECHNIQUE: Multidetector CT imaging of the thoracic and lumbar spine was performed without contrast. Multiplanar CT image reconstructions were also generated. COMPARISON:  Same day radiographs of the thoracic spine from 12/28/2016. Lateral CXR 08/05/2016 FINDINGS: CT THORACIC SPINE FINDINGS Alignment: Maintained thoracic curvature. Vertebrae: Burst fracture with 50% height loss of the T9 vertebral body with 3 mm of retropulsion of the posterior cortex. Eccentric vertical striations  noted of the T5 through T11 vertebral bodies consistent with osteopenia. Schmorl's nodes noted along the inferior endplate of T8, T9 and K35 Paraspinal and other soft tissues: Mild paraspinal soft tissue edema about the T9 fracture. No focal paraspinal hematoma. Findings would suggest a recent fracture. Moderate-sized hiatal hernia. Coronary arteriosclerosis and aortic atherosclerosis. Disc levels: Degenerative disc disease with disc space narrowing of the included lower cervical spine from C4-5, C5-6 and C6-7. No focal thoracic spine disc herniations. Schmorl's nodes along the inferior endplates of T7, T8, W65 and T11. CT LUMBAR SPINE FINDINGS Segmentation: 5 lumbar type vertebrae. Alignment: Dextroscoliosis of the lumbar spine with apex at L3. Vertebrae: No acute fracture. Paraspinal and  other soft tissues: No paraspinal soft tissue hematoma or mass. No adenopathy. Disc levels: Schmorl's nodes noted at T11 and T12. There is degenerative disc disease at all levels of the lumbar spine with associated facet arthropathy. Left lateral disc bulge at L2-3. There is grade 1 retrolisthesis of L2 on L3, L3 on L4 and L4 and L5. IMPRESSION: CT THORACIC SPINE IMPRESSION Burst type fracture with 50% height loss of the T9 vertebral body with 3 mm retropulsion causing mild bony central canal stenosis. CT LUMBAR SPINE IMPRESSION Dextroscoliosis with lumbar spondylosis. Retrolisthesis of L2 on L3, L3 on L4 and L4 on L5 grade 1. No acute lumbar spine fracture. Electronically Signed   By: Ashley Royalty M.D.   On: 12/29/2016 01:37   Ct Lumbar Spine Wo Contrast  Result Date: 12/29/2016 CLINICAL DATA:  T9 fracture EXAM: CT THORACIC AND LUMBAR SPINE WITHOUT CONTRAST TECHNIQUE: Multidetector CT imaging of the thoracic and lumbar spine was performed without contrast. Multiplanar CT image reconstructions were also generated. COMPARISON:  Same day radiographs of the thoracic spine from 12/28/2016. Lateral CXR 08/05/2016 FINDINGS: CT  THORACIC SPINE FINDINGS Alignment: Maintained thoracic curvature. Vertebrae: Burst fracture with 50% height loss of the T9 vertebral body with 3 mm of retropulsion of the posterior cortex. Eccentric vertical striations noted of the T5 through T11 vertebral bodies consistent with osteopenia. Schmorl's nodes noted along the inferior endplate of T8, T9 and K93 Paraspinal and other soft tissues: Mild paraspinal soft tissue edema about the T9 fracture. No focal paraspinal hematoma. Findings would suggest a recent fracture. Moderate-sized hiatal hernia. Coronary arteriosclerosis and aortic atherosclerosis. Disc levels: Degenerative disc disease with disc space narrowing of the included lower cervical spine from C4-5, C5-6 and C6-7. No focal thoracic spine disc herniations. Schmorl's nodes along the inferior endplates of T7, T8, O67 and T11. CT LUMBAR SPINE FINDINGS Segmentation: 5 lumbar type vertebrae. Alignment: Dextroscoliosis of the lumbar spine with apex at L3. Vertebrae: No acute fracture. Paraspinal and other soft tissues: No paraspinal soft tissue hematoma or mass. No adenopathy. Disc levels: Schmorl's nodes noted at T11 and T12. There is degenerative disc disease at all levels of the lumbar spine with associated facet arthropathy. Left lateral disc bulge at L2-3. There is grade 1 retrolisthesis of L2 on L3, L3 on L4 and L4 and L5. IMPRESSION: CT THORACIC SPINE IMPRESSION Burst type fracture with 50% height loss of the T9 vertebral body with 3 mm retropulsion causing mild bony central canal stenosis. CT LUMBAR SPINE IMPRESSION Dextroscoliosis with lumbar spondylosis. Retrolisthesis of L2 on L3, L3 on L4 and L4 on L5 grade 1. No acute lumbar spine fracture. Electronically Signed   By: Ashley Royalty M.D.   On: 12/29/2016 01:37   Dg Abdomen Acute W/chest  Result Date: 12/31/2016 CLINICAL DATA:  83 year old presenting with generalized weakness and constipation. EXAM: DG ABDOMEN ACUTE W/ 1V CHEST COMPARISON:  CT  abdomen and pelvis 09/19/2004. Chest x-rays 08/05/2016, 04/14/2016 and earlier. FINDINGS: Gas within normal-caliber loops of small bowel throughout the abdomen and pelvis. Gas and expected stool burden throughout normal caliber colon. No visible opaque urinary tract calculi. Surgical clips in the right upper quadrant from prior cholecystectomy. No evidence of free intraperitoneal air or significant air-fluid levels on the erect image. Aortoiliofemoral atherosclerosis. Lumbar dextroscoliosis. Severe degenerative changes throughout the lumbar spine. Severe osseous demineralization. Cardiac silhouette moderately enlarged for AP technique, increased in size since 2017. Thoracic aorta atherosclerotic, unchanged. Large hiatal hernia, increased in size. Paratracheal opacity in the superior mediastinum  is likely related to the combination of thyroid gland enlargement and mediastinal fat better seen on the AP image. Lungs clear. Bronchovascular markings normal. Pulmonary vascularity normal. No visible pleural effusions. No pneumothorax. IMPRESSION: 1. No acute abdominal abnormality. 2. Moderate cardiomegaly, with interval increase in heart size since November, 2017. 3.  No acute cardiopulmonary disease. 4. Large hiatal hernia. Electronically Signed   By: Evangeline Dakin M.D.   On: 12/31/2016 19:31    Microbiology: Recent Results (from the past 240 hour(s))  Urine culture     Status: None   Collection Time: 12/31/16  6:37 PM  Result Value Ref Range Status   Specimen Description URINE, RANDOM  Final   Special Requests NONE  Final   Culture NO GROWTH  Final   Report Status 01/02/2017 FINAL  Final     Labs: Basic Metabolic Panel:  Recent Labs Lab 12/31/16 2328 01/01/17 0337 01/02/17 0737 01/03/17 0401 01/04/17 0248  NA 121* 127* 134* 133* 134*  K 3.4* 3.5 3.4* 3.5 3.3*  CL 89* 94* 100* 99* 101  CO2 22 25 25 25 27   GLUCOSE 96 74 85 106* 89  BUN 21* 19 20 22* 19  CREATININE 1.40* 1.38* 1.73* 1.52*  1.40*  CALCIUM 8.3* 8.7* 8.7* 8.7* 8.7*   Liver Function Tests: No results for input(s): AST, ALT, ALKPHOS, BILITOT, PROT, ALBUMIN in the last 168 hours. No results for input(s): LIPASE, AMYLASE in the last 168 hours. No results for input(s): AMMONIA in the last 168 hours. CBC:  Recent Labs Lab 12/31/16 1837 01/01/17 0337 01/03/17 0401 01/04/17 0248  WBC 5.7 5.3 5.6 4.9  HGB 9.4* 8.9* 8.4* 8.5*  HCT 25.7* 25.3* 25.0* 25.7*  MCV 93.1 94.1 98.4 98.5  PLT 122* 122* 136* 152   Cardiac Enzymes: No results for input(s): CKTOTAL, CKMB, CKMBINDEX, TROPONINI in the last 168 hours. BNP: BNP (last 3 results) No results for input(s): BNP in the last 8760 hours.  ProBNP (last 3 results) No results for input(s): PROBNP in the last 8760 hours.  CBG:  Recent Labs Lab 01/01/17 0753 01/01/17 0854 01/02/17 0724 01/04/17 0726 01/05/17 0647  GLUCAP 65 78 89 92 95       Signed:  Sequita Wise MD.  [facility] 01/05/2017, 11:09 AM

## 2017-01-06 ENCOUNTER — Non-Acute Institutional Stay (SKILLED_NURSING_FACILITY): Payer: Medicare Other | Admitting: Internal Medicine

## 2017-01-06 ENCOUNTER — Other Ambulatory Visit: Payer: Self-pay

## 2017-01-06 DIAGNOSIS — E871 Hypo-osmolality and hyponatremia: Secondary | ICD-10-CM | POA: Diagnosis not present

## 2017-01-06 DIAGNOSIS — D649 Anemia, unspecified: Secondary | ICD-10-CM

## 2017-01-06 DIAGNOSIS — F419 Anxiety disorder, unspecified: Secondary | ICD-10-CM | POA: Diagnosis not present

## 2017-01-06 DIAGNOSIS — M546 Pain in thoracic spine: Secondary | ICD-10-CM

## 2017-01-06 DIAGNOSIS — E860 Dehydration: Secondary | ICD-10-CM

## 2017-01-06 DIAGNOSIS — E876 Hypokalemia: Secondary | ICD-10-CM | POA: Diagnosis not present

## 2017-01-06 DIAGNOSIS — D696 Thrombocytopenia, unspecified: Secondary | ICD-10-CM

## 2017-01-06 DIAGNOSIS — I10 Essential (primary) hypertension: Secondary | ICD-10-CM | POA: Diagnosis not present

## 2017-01-06 DIAGNOSIS — F3341 Major depressive disorder, recurrent, in partial remission: Secondary | ICD-10-CM | POA: Diagnosis not present

## 2017-01-06 DIAGNOSIS — S22009D Unspecified fracture of unspecified thoracic vertebra, subsequent encounter for fracture with routine healing: Secondary | ICD-10-CM | POA: Diagnosis not present

## 2017-01-07 ENCOUNTER — Ambulatory Visit: Payer: Self-pay | Admitting: Licensed Clinical Social Worker

## 2017-01-08 ENCOUNTER — Encounter: Payer: Self-pay | Admitting: Licensed Clinical Social Worker

## 2017-01-08 ENCOUNTER — Other Ambulatory Visit: Payer: Self-pay | Admitting: Licensed Clinical Social Worker

## 2017-01-08 NOTE — Patient Outreach (Addendum)
[facility]) Care Management  [facility] Social Work  01/08/2017  Peter Wolfe [id number]   Encounter Medications:  Outpatient Encounter Prescriptions as of 01/08/2017  Medication Sig  . albuterol (PROVENTIL HFA;VENTOLIN HFA) 108 (90 Base) MCG/ACT inhaler Inhale 2 puffs into the lungs every 6 (six) hours as needed. (Patient taking differently: Inhale 2 puffs into the lungs every 6 (six) hours as needed for wheezing or shortness of breath. )  . bisacodyl (DULCOLAX) 10 MG suppository Place 1 suppository (10 mg total) rectally daily as needed for moderate constipation.  . busPIRone (BUSPAR) 30 MG tablet TAKE 1 TABLET DAILY (Patient taking differently: Take 30 mg by mouth once a day)  . cloNIDine (CATAPRES) 0.1 MG tablet TAKE 1 TABLET DAILY (Patient taking differently: Take 0.1 mg by mouth once a day)  . FLUoxetine (PROZAC) 40 MG capsule Take 1 capsule (40 mg total) by mouth daily.  Marland Kitchen HYDROcodone-acetaminophen (NORCO/VICODIN) 5-325 MG tablet Take 1 tablet by mouth every 6 (six) hours as needed for moderate pain.  Marland Kitchen lidocaine (LIDODERM) 5 % Place 2 patches onto the skin daily. Remove & Discard patch within 12 hours or as directed by MD  . methylPREDNISolone (MEDROL) 2 MG tablet Take 1 tablet (2 mg total) by mouth daily. For 5days then STOP  . mirtazapine (REMERON) 45 MG tablet Take 1 tablet (45 mg total) by mouth at bedtime.  . Multiple Vitamin (MULTIVITAMIN) capsule Take 1 capsule by mouth daily.  Marland Kitchen omeprazole (PRILOSEC) 40 MG capsule Take 1 capsule (40 mg total) by mouth daily.  . polyethylene glycol (MIRALAX) packet Take 17 g by mouth daily.  Marland Kitchen senna (SENOKOT) 8.6 MG TABS tablet Take 2 tablets (17.2 mg total) by mouth 2 (two) times daily.  . simvastatin (ZOCOR) 10 MG tablet TAKE 1 TABLET AT BEDTIME (Patient taking differently: Take 10 mg by mouth once a day)  . tamsulosin (FLOMAX) 0.4 MG CAPS capsule TAKE 1 CAPSULE DAILY (Patient taking differently: Take 0.4 mg  by mouth once a day)   No facility-administered encounter medications on file as of 01/08/2017.     Functional Status:  In your present state of health, do you have any difficulty performing the following activities: 01/01/2017 12/29/2016  Hearing? N N  Vision? N N  Difficulty concentrating or making decisions? N N  Walking or climbing stairs? Y N  Dressing or bathing? N N  Doing errands, shopping? N N  Some recent data might be hidden    Fall/Depression Screening:  PHQ 2/9 Scores 01/08/2017 11/28/2015 11/16/2014 06/15/2014  PHQ - 2 Score 1 0 1 0    Assessment: CSW completed SNF visit on 01/08/17 at [facility] with patient and [facility] CSW Raynaldo Opitz. Patient recently admitted on 12/31/16 and discharged to SNF on 01/05/17. Patient has a history of :  T9 Compression Fracture, Hyponatremia, Chronic Constipation, Back pain, GERD, Hypertension, Depression, CKD and Hypokalemia. Patient reports that he had PT for 16 weeks for a torn achilles heel. Patient states that PT was coming to him. He shared that he had a fall after going to the grocery store and did not go to the ED. He stated that after 3 days, he started to experience severe pain and went to the ED on 12/28/16. He shared that he discharged from hospital but then returned to ED on 12/30/16 and 12/31/16 and evidently discharged to SNF.   Patient reports that he continues to struggle chronic constipation and that this has been an ongoing issue  for him all of his life. He stated that he recently started pain medication due to fall and it has made his constipation worse. Patient shared that he experienced extreme dehydration while in the hospital and experienced some hallucinations but has not experienced that since then. Patient reports having shower chair with back, two different canes and a walker.  Patient shares that he has a very strong support network within his church. He reports that he 3 close male friends within his church that often provide  transportation for him, assist with grocery shopping, picking up medications, etc. He reported "If I miss one day of mass then I know someone will be calling me from church to check up on me." Patient shares that his friend from church brought him clothes and mail from his home. Patient experienced some anxiety during hospitalization because he was worried about managing bills but is now more at ease with the assistance of his church members. Patient reports that he has been going to church daily for decades. Patient's other support system reside in [place] where his sister and her family resides. 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 He reports that he is very familiar with VA benefits. Patient reports "I get more benefits than most vets because I am retired and get a pension." Patient's monthly income is $3,500.   Patient shares that his family (niece) is wanting him to reside at [facility] facility. However, patient is not agreeable to this and wants to be as independent as possible. Patient is able to dress and shower himself at this time. Patient is able to walk now with both cane and a walker. He states that he would be more agreeable to Independent Living instead of LTC nursing facility placement. CSW provided education on Independent Living Facilities and Assisted Living Facilities. CSW provided education on the process of placement. CSW provided a handout of different ILF's as well as 4 pages of information on what all ALF entails. 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 He shares that fitness and exercise is very important to him. Patient shares that he has recently been experiencing some depressive symptoms due to hospitalization but that this has subsided recently due to improvements with overall health. CSW educated patient on the  [facility] but patient denied wishing to use this resource. Patient shares that he likes to socialize and goes to church and out to eat once a week with friends. CSW provided patient with a Engineer, technical sales for [employer].   CSW spoke to the SNF discharge planner and was informed that patient has been a lot of pain since coming to [facility] and that they are going to change his pain medications to alleviate his pain. She has no updated in regards to discharge since patient just arrived to SNF.   [facility] CM Care Plan Problem One     Most Recent Value  Care Plan Problem One  SNF admission  Role Documenting the Problem One  Clinical Social Worker  Care Plan for Problem One  Active  [facility] Long Term Goal (31-90 days)  Patient will have a safe and stable discharge back home from SNF within 90 days per patient report  [facility] Long Term Goal Start Date  01/08/17  Interventions for Problem One Long Term Goal  CSW will  continue to complete SNF visits, coordinate care with SNF discharge planner and assist with gaining needed medical equipment, services and resoures to provide a smooth transition back home.  [facility] CM Short Term Goal #1 (0-30 days)  Patient will attend all scheduled appointments within the next 30 days per patient  [facility] CM Short Term Goal #1 Start Date  01/08/17  Interventions for Short Term Goal #1  CSW will encourage and support patient and provide MI interventions as needed to support goal  [facility] CM Short Term Goal #2 (0-30 days)  Patient will consider transitioning to either ALF or ILF within 30 days post SNF discharge per patient report  [facility] CM Short Term Goal #2 Start Date  01/08/17  Interventions for Short Term Goal #2  CSW completed SNF visit and provided education and materials in regards to ILF and ALF placement as well as education in regards to the placement process. CSW will re educate as needed.     Plan: CSW will route encounter to PCP. CSW will follow up with SNF  discharge planner within one week.  Eula Fried, BSW, MSW, [email or web address] Phone: [phone] Fax: [phone]

## 2017-01-09 ENCOUNTER — Ambulatory Visit: Payer: Self-pay | Admitting: Licensed Clinical Social Worker

## 2017-01-09 ENCOUNTER — Non-Acute Institutional Stay (SKILLED_NURSING_FACILITY): Payer: Medicare Other | Admitting: Internal Medicine

## 2017-01-09 DIAGNOSIS — S22009D Unspecified fracture of unspecified thoracic vertebra, subsequent encounter for fracture with routine healing: Secondary | ICD-10-CM

## 2017-01-09 DIAGNOSIS — M546 Pain in thoracic spine: Secondary | ICD-10-CM

## 2017-01-09 LAB — CBC AND DIFFERENTIAL
HCT: 28 % — AB (ref 41–53)
Hemoglobin: 9.3 g/dL — AB (ref 13.5–17.5)
PLATELETS: 175 10*3/uL (ref 150–399)
WBC: 5.7 10^3/mL

## 2017-01-09 LAB — BASIC METABOLIC PANEL
BUN: 22 mg/dL — AB (ref 4–21)
CREATININE: 1.2 mg/dL (ref 0.6–1.3)
Glucose: 151 mg/dL
POTASSIUM: 3.7 mmol/L (ref 3.4–5.3)
Sodium: 138 mmol/L (ref 137–147)

## 2017-01-10 ENCOUNTER — Encounter: Payer: Self-pay | Admitting: Internal Medicine

## 2017-01-10 DIAGNOSIS — E860 Dehydration: Secondary | ICD-10-CM | POA: Insufficient documentation

## 2017-01-10 DIAGNOSIS — F419 Anxiety disorder, unspecified: Secondary | ICD-10-CM | POA: Insufficient documentation

## 2017-01-10 NOTE — Progress Notes (Signed)
: Provider:   Location:  [place] of Service:  SNF (31)  PCP: [name], NP Patient Care Team: [name], NP as PCP - General (Family Medicine) Greg Cutter, LCSW as [place] Management (Licensed Clinical Social Worker)  Extended Emergency Contact Information Primary Emergency Contact: [place], [place] [address] [place] Phone: [phone] Mobile Phone: [phone] Relation: Friend Secondary Emergency Contact: Cox,Mary  [place] Phone: [phone] Relation: Niece     Allergies: Influenza virus vacc split pf; Doxycycline; and Eggs or egg-derived products  No chief complaint on file.   HPI: Patient is 83 y.o. male with hypertension, GERD, depression, anxiety, and recent fall with thoracic compression fracture and resulting pain who presents to the emergency department with intractable pain in the back and bilateral ribs with inability to get up from bed or care from himself secondary to this. Patient was admitted briefly on 12/29/2016 after suffering a thoracic compression fracture. Neurosurgery was consulted and advised conservative management and the patient was discharged with pain medications. He was also noted to have an acute kidney injury during that admission and his NSAID and diuretics were discontinued at time of discharge. Since returning home, his pain has been on bearable despite his use of the prescribed Norco, worse with any movement and he has essentially been bedbound since returning home. He lives alone and hasn't been able to care for himself secondary to this. Chemistry panel noted a sodium of 120, potassium 3.3, BUN 24, and serum creatinine 1.51 Pt was admitted to [facility] from 4/4-9 where he was treated for pain control and for dehydration with AKI. Hospital course was complicated by a possible UTI, tx with antibiotics. Pt is admitted to SNF for further  support and OT/PT. While at SNF pt will be followed for depression , tx with prozac and remeron, anxiety, tx with buspar and HTN, tx with catapres.  Past Medical History:  Diagnosis Date  . BPH (benign prostatic hyperplasia)   . Broken rib   . COPD (chronic obstructive pulmonary disease) ([place])   . GERD (gastroesophageal reflux disease)   . GERD (gastroesophageal reflux disease)   . Hyperlipidemia   . Hypertension   . Insomnia   . Panic attack     Past Surgical History:  Procedure Laterality Date  . CHOLECYSTECTOMY      Allergies as of 01/06/2017      Reactions   Influenza Virus Vacc Split Pf Other (See Comments)   Allergic to eggs   Doxycycline Rash   Eggs Or Egg-derived Products Rash      Medication List       Accurate as of 01/06/17 11:59 PM. Always use your most recent med list.          albuterol 108 (90 Base) MCG/ACT inhaler Commonly known as:  PROVENTIL HFA;VENTOLIN HFA Inhale 2 puffs into the lungs every 6 (six) hours as needed.   bisacodyl 10 MG suppository Commonly known as:  DULCOLAX Place 1 suppository (10 mg total) rectally daily as needed for moderate constipation.   busPIRone 30 MG tablet Commonly known as:  BUSPAR TAKE 1 TABLET DAILY   cloNIDine 0.1 MG tablet Commonly known as:  CATAPRES TAKE 1 TABLET DAILY   FLUoxetine 40 MG capsule Commonly known as:  PROZAC Take 1 capsule (40 mg total) by mouth daily.   HYDROcodone-acetaminophen 5-325 MG tablet Commonly known  as:  NORCO/VICODIN Take 1 tablet by mouth every 6 (six) hours as needed for moderate pain.   lidocaine 5 % Commonly known as:  LIDODERM Place 2 patches onto the skin daily. Remove & Discard patch within 12 hours or as directed by MD   methylPREDNISolone 2 MG tablet Commonly known as:  MEDROL Take 1 tablet (2 mg total) by mouth daily. For 5days then STOP   mirtazapine 45 MG tablet Commonly known as:  REMERON Take 1 tablet (45 mg total) by mouth at bedtime.   multivitamin  capsule Take 1 capsule by mouth daily.   omeprazole 40 MG capsule Commonly known as:  PRILOSEC Take 1 capsule (40 mg total) by mouth daily.   polyethylene glycol packet Commonly known as:  MIRALAX Take 17 g by mouth daily.   senna 8.6 MG Tabs tablet Commonly known as:  SENOKOT Take 2 tablets (17.2 mg total) by mouth 2 (two) times daily.   simvastatin 10 MG tablet Commonly known as:  ZOCOR TAKE 1 TABLET AT BEDTIME   tamsulosin 0.4 MG Caps capsule Commonly known as:  FLOMAX TAKE 1 CAPSULE DAILY       No orders of the defined types were placed in this encounter.   Immunization History  Administered Date(s) Administered  . Pneumococcal Conjugate-13 06/15/2014  . Pneumococcal Polysaccharide-23 09/29/2005  . Td 09/29/2000  . Tdap 06/11/2011  . Zoster 06/14/2008    Social History  Substance Use Topics  . Smoking status: Never Smoker  . Smokeless tobacco: Never Used  . Alcohol use 0.0 oz/week     Comment: Wine with meals     Family history is   Family History  Problem Relation Age of Onset  . Emphysema Father   . Alcohol abuse Father   . Diabetes Sister       Review of Systems  DATA OBTAINED: from patient, nurse GENERAL:  no fevers, fatigue, appetite changes SKIN: No itching, or rash EYES: No eye pain, redness, discharge EARS: No earache, tinnitus, change in hearing NOSE: No congestion, drainage or bleeding  MOUTH/THROAT: No mouth or tooth pain, No sore throat RESPIRATORY: No cough, wheezing, SOB CARDIAC: No chest pain, palpitations, lower extremity edema  GI: No abdominal pain, No N/V/D or constipation, No heartburn or reflux  GU: No dysuria, frequency or urgency, or incontinence  MUSCULOSKELETAL: No unrelieved bone/joint pain NEUROLOGIC: No headache, dizziness or focal weakness PSYCHIATRIC: No c/o anxiety or sadness   Vitals:   01/10/17 1924  BP: (!) 164/88  Pulse: 80  Resp: 20  Temp: 97.4 F (36.3 C)    SpO2 Readings from Last 1 Encounters:    01/05/17 95%   There is no height or weight on file to calculate BMI.     Physical Exam  GENERAL APPEARANCE: Alert, conversant,  No acute distress.  SKIN: No diaphoresis rash HEAD: Normocephalic, atraumatic  EYES: Conjunctiva/lids clear. Pupils round, reactive. EOMs intact.  EARS: External exam WNL, canals clear. Hearing grossly normal.  NOSE: No deformity or discharge.  MOUTH/THROAT: Lips w/o lesions  RESPIRATORY: Breathing is even, unlabored. Lung sounds are clear   CARDIOVASCULAR: Heart RRR no murmurs, rubs or gallops. No peripheral edema.   GASTROINTESTINAL: Abdomen is soft, non-tender, not distended w/ normal bowel sounds. GENITOURINARY: Bladder non tender, not distended  MUSCULOSKELETAL:Pt is wearing a shellbrace around torso NEUROLOGIC:  Cranial nerves 2-12 grossly intact. Moves all extremities  PSYCHIATRIC: Mood and affect appropriate to situation, no behavioral issues  Patient Active Problem List   Diagnosis  Date Noted  . Hyponatremia 12/31/2016  . Hypokalemia 12/31/2016  . Normocytic anemia 12/31/2016  . Acute lower UTI 12/31/2016  . Fall 12/29/2016  . Thoracic spine fracture ([place]) 12/29/2016  . Back pain 12/29/2016  . CKD (chronic kidney disease), stage III 12/29/2016  . Depression 06/03/2016  . History of fractured rib 11/28/2015  . History of peripheral edema 11/28/2015  . Seborrheic keratosis, inflamed 08/13/2015  . Actinic keratosis 08/13/2015  . Fatigue 07/31/2015  . Essential hypertension, benign 07/31/2015  . Urinary frequency 08/16/2009  . URTICARIA 11/13/2007  . Hyperlipidemia 06/08/2007  . GERD 06/08/2007  . PANIC ATTACK 05/07/2007  . COPD 05/07/2007  . INSOMNIA 05/07/2007      Labs reviewed: Basic Metabolic Panel:    Component Value Date/Time   NA 134 (L) 01/04/2017 0248   K 3.3 (L) 01/04/2017 0248   CL 101 01/04/2017 0248   CO2 27 01/04/2017 0248   GLUCOSE 89 01/04/2017 0248   BUN 19 01/04/2017 0248   CREATININE 1.40 (H)  01/04/2017 0248   CALCIUM 8.7 (L) 01/04/2017 0248   PROT 9.6 (H) 12/29/2016 0827   ALBUMIN 3.3 (L) 12/29/2016 0827   AST 19 12/29/2016 0827   ALT 17 12/29/2016 0827   ALKPHOS 77 12/29/2016 0827   BILITOT 1.2 12/29/2016 0827   GFRNONAA 44 (L) 01/04/2017 0248   GFRAA 51 (L) 01/04/2017 0248     Recent Labs  04/03/16 1145  01/02/17 0737 01/03/17 0401 01/04/17 0248  NA 132*  < > 134* 133* 134*  K 3.5  < > 3.4* 3.5 3.3*  CL 96  < > 100* 99* 101  CO2 28  < > 25 25 27   GLUCOSE 110*  < > 85 106* 89  BUN 15  < > 20 22* 19  CREATININE 1.14  < > 1.73* 1.52* 1.40*  CALCIUM 9.7  < > 8.7* 8.7* 8.7*  MG 1.7  --   --   --   --   < > = values in this interval not displayed. Liver Function Tests:  Recent Labs  04/03/16 1145 12/29/16 0827  AST 16 19  ALT 16 17  ALKPHOS 41 77  BILITOT 1.0 1.2  PROT 9.0* 9.6*  ALBUMIN 3.9 3.3*   No results for input(s): LIPASE, AMYLASE in the last 8760 hours. No results for input(s): AMMONIA in the last 8760 hours. CBC:  Recent Labs  04/03/16 1145 12/29/16 0111  01/01/17 0337 01/03/17 0401 01/04/17 0248  WBC 4.2 8.1  < > 5.3 5.6 4.9  NEUTROABS 2.2 5.1  --   --   --   --   HGB 12.6* 10.5*  < > 8.9* 8.4* 8.5*  HCT 36.5* 29.7*  < > 25.3* 25.0* 25.7*  MCV 100.5* 97.1  < > 94.1 98.4 98.5  PLT 154.0 126*  < > 122* 136* 152  < > = values in this interval not displayed. Lipid No results for input(s): CHOL, HDL, LDLCALC, TRIG in the last 8760 hours.  Cardiac Enzymes:  Recent Labs  12/29/16 0827  CKTOTAL 46*   BNP: No results for input(s): BNP in the last 8760 hours. No results found for: MICROALBUR No results found for: HGBA1C Lab Results  Component Value Date   TSH 2.857 01/01/2017   Lab Results  Component Value Date   VITAMINB12 502 12/31/2016   Lab Results  Component Value Date   FOLATE 26.6 12/31/2016   Lab Results  Component Value Date   IRON 59 12/31/2016  TIBC 175 (L) 12/31/2016   FERRITIN 234 12/31/2016     Imaging and Procedures obtained prior to SNF admission: Dg Abdomen Acute W/chest  Result Date: 12/31/2016 CLINICAL DATA:  83 year old presenting with generalized weakness and constipation. EXAM: DG ABDOMEN ACUTE W/ 1V CHEST COMPARISON:  CT abdomen and pelvis 09/19/2004. Chest x-rays 08/05/2016, 04/14/2016 and earlier. FINDINGS: Gas within normal-caliber loops of small bowel throughout the abdomen and pelvis. Gas and expected stool burden throughout normal caliber colon. No visible opaque urinary tract calculi. Surgical clips in the right upper quadrant from prior cholecystectomy. No evidence of free intraperitoneal air or significant air-fluid levels on the erect image. Aortoiliofemoral atherosclerosis. Lumbar dextroscoliosis. Severe degenerative changes throughout the lumbar spine. Severe osseous demineralization. Cardiac silhouette moderately enlarged for AP technique, increased in size since 2017. Thoracic aorta atherosclerotic, unchanged. Large hiatal hernia, increased in size. Paratracheal opacity in the superior mediastinum is likely related to the combination of thyroid gland enlargement and mediastinal fat better seen on the AP image. Lungs clear. Bronchovascular markings normal. Pulmonary vascularity normal. No visible pleural effusions. No pneumothorax. IMPRESSION: 1. No acute abdominal abnormality. 2. Moderate cardiomegaly, with interval increase in heart size since November, 2017. 3.  No acute cardiopulmonary disease. 4. Large hiatal hernia. Electronically Signed   By: Evangeline Dakin M.D.   On: 12/31/2016 19:31     Not all labs, radiology exams or other studies done during hospitalization come through on my EPIC note; however they are reviewed by me.    Assessment and Plan  HYPONATREMIA/HYPOVOLEMIC/ HYPOKALEMIA- Na 120 on admission , K+ 3.3, corrected with IVF and replacement SNF - will f/u BMP  UTI- Pt complained of dysuria and urinary urgency/frequency on admission; UA was  compatible with infection in this setting;  Treated with IV ceftriaxone for 3days, urine Cx negative stopped Abx SNF - cont flomax0.4 mg daily for BPH  T9 BODY FRACTURE 2/2 FALL-Neurosurgery consulted last admission/past week and advised conservative mgmt ; now on medrol 4mg  for 2days then 2mg  for 4days, SNF- finish medrol taper; cont 2 lidocaine patches to back and PRN hydrocodone, tylenol; pt reported to not want to come to rehab but relented  NORMOCYTIC ANEMIA/ THROMBOCYTOPENIA-Hgb is 9.4 on admission, now 8.5, down from 10 two days prior   No evidence for bleeding, hemoccult negative, anemia panel unremarkable  SNF - will f/u CBC  DEPRESSION  SNF - not stated as uncontrolled;plan to cont remeron 45 mg qHS and prozac 40 mg daily  ANXIETY SNF - not stated as uncontrolled;plan to cont buspar 30 mg daily  HTN SNF - controlled; catapres 0.1 mg daily   Time spent . 45 min;> 50% of time with patient was spent reviewing records, labs, tests and studies, counseling and developing plan of care  Inocencio Homes, MD

## 2017-01-10 NOTE — Progress Notes (Signed)
Location:   [place] of Service:   [place] and [place] D. Sheppard Coil, MD  Patient Care Team: [name], NP as PCP - General (Family Medicine) Greg Cutter, LCSW as [place] Management (Licensed Clinical Social Worker)  Extended Emergency Contact Information Primary Emergency Contact: [place], [place] [address] Johnnette Litter of [place] Phone: [phone] Mobile Phone: [phone] Relation: Friend Secondary Emergency Contact: Cox,Mary  [place] Phone: [phone] Relation: Niece    Allergies: Influenza virus vacc split pf; Doxycycline; and Eggs or egg-derived products  Chief Complaint  Patient presents with  . Acute Visit    HPI: Patient is 83 y.o. male  With a T9 compression fracture who wants to speak to me about his pain meds, in that he wants them more often.  He is on norco 5/325 mg q 6 prn.  Past Medical History:  Diagnosis Date  . BPH (benign prostatic hyperplasia)   . Broken rib   . COPD (chronic obstructive pulmonary disease) ([place])   . GERD (gastroesophageal reflux disease)   . GERD (gastroesophageal reflux disease)   . Hyperlipidemia   . Hypertension   . Insomnia   . Panic attack     Past Surgical History:  Procedure Laterality Date  . CHOLECYSTECTOMY      Allergies as of 01/09/2017      Reactions   Influenza Virus Vacc Split Pf Other (See Comments)   Allergic to eggs   Doxycycline Rash   Eggs Or Egg-derived Products Rash      Medication List       Accurate as of 01/09/17 11:59 PM. Always use your most recent med list.          albuterol 108 (90 Base) MCG/ACT inhaler Commonly known as:  PROVENTIL HFA;VENTOLIN HFA Inhale 2 puffs into the lungs every 6 (six) hours as needed.   bisacodyl 10 MG suppository Commonly known as:  DULCOLAX Place 1 suppository (10 mg total) rectally daily as needed for moderate constipation.     busPIRone 30 MG tablet Commonly known as:  BUSPAR TAKE 1 TABLET DAILY   cloNIDine 0.1 MG tablet Commonly known as:  CATAPRES TAKE 1 TABLET DAILY   FLUoxetine 40 MG capsule Commonly known as:  PROZAC Take 1 capsule (40 mg total) by mouth daily.   HYDROcodone-acetaminophen 5-325 MG tablet Commonly known as:  NORCO/VICODIN Take 1 tablet by mouth every 6 (six) hours as needed for moderate pain.   lidocaine 5 % Commonly known as:  LIDODERM Place 2 patches onto the skin daily. Remove & Discard patch within 12 hours or as directed by MD   methylPREDNISolone 2 MG tablet Commonly known as:  MEDROL Take 1 tablet (2 mg total) by mouth daily. For 5days then STOP   mirtazapine 45 MG tablet Commonly known as:  REMERON Take 1 tablet (45 mg total) by mouth at bedtime.   multivitamin capsule Take 1 capsule by mouth daily.   omeprazole 40 MG capsule Commonly known as:  PRILOSEC Take 1 capsule (40 mg total) by mouth daily.   polyethylene glycol packet Commonly known as:  MIRALAX Take 17 g by mouth daily.   senna 8.6 MG Tabs tablet Commonly known as:  SENOKOT Take 2 tablets (17.2 mg total) by mouth 2 (two) times daily.   simvastatin 10 MG tablet Commonly known as:  ZOCOR TAKE 1 TABLET AT  BEDTIME   tamsulosin 0.4 MG Caps capsule Commonly known as:  FLOMAX TAKE 1 CAPSULE DAILY       No orders of the defined types were placed in this encounter.   Immunization History  Administered Date(s) Administered  . PPD Test 01/06/2017  . Pneumococcal Conjugate-13 06/15/2014  . Pneumococcal Polysaccharide-23 09/29/2005  . Td 09/29/2000  . Tdap 06/11/2011  . Zoster 06/14/2008    Social History  Substance Use Topics  . Smoking status: Never Smoker  . Smokeless tobacco: Never Used  . Alcohol use 0.0 oz/week     Comment: Wine with meals     Review of Systems  DATA OBTAINED: from patient GENERAL:  no fevers, fatigue, appetite changes SKIN: No itching, rash HEENT: No  complaint RESPIRATORY: No cough, wheezing, SOB CARDIAC: No chest pain, palpitations, lower extremity edema  GI: No abdominal pain, No N/V/D or constipation, No heartburn or reflux  GU: No dysuria, frequency or urgency, or incontinence  MUSCULOSKELETAL: + unrelieved back pain NEUROLOGIC: No headache, dizziness  PSYCHIATRIC: No overt anxiety or sadness  Vitals:   01/12/17 1217  BP: 120/86  Pulse: 78  Resp: 20  Temp: 97 F (36.1 C)   Body mass index is 22.17 kg/m. Physical Exam  GENERAL APPEARANCE: Alert, conversant, No acute distress  SKIN: No diaphoresis rash HEENT: Unremarkable RESPIRATORY: Breathing is even, unlabored. Lung sounds are clear   CARDIOVASCULAR: Heart RRR no murmurs, rubs or gallops. No peripheral edema  GASTROINTESTINAL: Abdomen is soft, non-tender, not distended w/ normal bowel sounds.  GENITOURINARY: Bladder non tender, not distended  MUSCULOSKELETAL: wearing brace NEUROLOGIC: Cranial nerves 2-12 grossly intact. Moves all extremities PSYCHIATRIC: Mood and affect appropriate to situation, no behavioral issues  Patient Active Problem List   Diagnosis Date Noted  . Dehydration 01/10/2017  . Thrombocytopenia ([place]) 01/10/2017  . Anxiety 01/10/2017  . Hyponatremia 12/31/2016  . Hypokalemia 12/31/2016  . Normocytic anemia 12/31/2016  . Acute lower UTI 12/31/2016  . Fall 12/29/2016  . Closed fracture of posterior thoracic vertebral body, with routine healing, subsequent encounter 12/29/2016  . Back pain 12/29/2016  . CKD (chronic kidney disease), stage III 12/29/2016  . Depression 06/03/2016  . History of fractured rib 11/28/2015  . History of peripheral edema 11/28/2015  . Seborrheic keratosis, inflamed 08/13/2015  . Actinic keratosis 08/13/2015  . Fatigue 07/31/2015  . Essential hypertension, benign 07/31/2015  . Urinary frequency 08/16/2009  . URTICARIA 11/13/2007  . Hyperlipidemia 06/08/2007  . GERD 06/08/2007  . PANIC ATTACK 05/07/2007  . COPD  05/07/2007  . INSOMNIA 05/07/2007    CMP     Component Value Date/Time   NA 134 (L) 01/04/2017 0248   NA 134 (A) 01/04/2017   K 3.3 (L) 01/04/2017 0248   CL 101 01/04/2017 0248   CO2 27 01/04/2017 0248   GLUCOSE 89 01/04/2017 0248   BUN 19 01/04/2017 0248   BUN 19 01/04/2017   CREATININE 1.40 (H) 01/04/2017 0248   CALCIUM 8.7 (L) 01/04/2017 0248   PROT 9.6 (H) 12/29/2016 0827   ALBUMIN 3.3 (L) 12/29/2016 0827   AST 19 12/29/2016 0827   ALT 17 12/29/2016 0827   ALKPHOS 77 12/29/2016 0827   BILITOT 1.2 12/29/2016 0827   GFRNONAA 44 (L) 01/04/2017 0248   GFRAA 51 (L) 01/04/2017 0248    Recent Labs  04/03/16 1145  01/02/17 0737 01/03/17 01/03/17 0401 01/04/17 01/04/17 0248  NA 132*  < > 134* 133* 133* 134* 134*  K 3.5  < > 3.4* 3.5  3.5  --  3.3*  CL 96  < > 100*  --  99*  --  101  CO2 28  < > 25  --  25  --  27  GLUCOSE 110*  < > 85  --  106*  --  89  BUN 15  < > 20 22* 22* 19 19  CREATININE 1.14  < > 1.73* 1.5* 1.52* 1.4* 1.40*  CALCIUM 9.7  < > 8.7*  --  8.7*  --  8.7*  MG 1.7  --   --   --   --   --   --   < > = values in this interval not displayed.  Recent Labs  04/03/16 1145 12/29/16 0827  AST 16 19  ALT 16 17  ALKPHOS 41 77  BILITOT 1.0 1.2  PROT 9.0* 9.6*  ALBUMIN 3.9 3.3*    Recent Labs  04/03/16 1145 12/29/16 0111  01/01/17 0337 01/03/17 01/03/17 0401 01/04/17 01/04/17 0248  WBC 4.2 8.1  8.1  < > 5.3 5.6 5.6 4.9 4.9  NEUTROABS 2.2 5.1  --   --   --   --   --   --   HGB 12.6* 10.5*  10.5*  < > 8.9* 8.4* 8.4*  --  8.5*  HCT 36.5* 29.7*  30*  < > 25.3* 25* 25.0*  --  25.7*  MCV 100.5* 97.1  < > 94.1  --  98.4  --  98.5  PLT 154.0 126*  126*  < > 122* 136* 136*  --  152  < > = values in this interval not displayed. No results for input(s): CHOL, LDLCALC, TRIG in the last 8760 hours.  Invalid input(s): HCL No results found for: [facility] Lab Results  Component Value Date   TSH 2.857 01/01/2017   No results found for: HGBA1C Lab  Results  Component Value Date   CHOL 152 07/31/2015   HDL 51.00 07/31/2015   LDLCALC 75 07/31/2015   LDLDIRECT 129.6 06/11/2011   TRIG 129.0 07/31/2015   CHOLHDL 3 07/31/2015    Significant Diagnostic Results in last 30 days:  Dg Ribs Unilateral W/chest Right  Result Date: 12/29/2016 CLINICAL DATA:  [place] on Wednesday getting out of car. History of right rib fractures. Complaining of mid to upper back pain and right-sided generalized rib pain. EXAM: RIGHT RIBS AND CHEST - 3+ VIEW COMPARISON:  None. FINDINGS: Moderate to large hiatal hernia. Aortic atherosclerosis with slight uncoiling. Emphysematous hyperinflation of the lungs without pneumonic consolidation, hemothorax or pneumothorax. Subacute to chronic appearing right fifth, anterior seventh and chronic appearing anterior tenth and eleventh rib fractures are noted. No acute displaced appearing fracture is visualized. Degenerative disc disease with spurring of the endplates at I9-4. IMPRESSION: 1. Subacute to chronic right fifth, anterior seventh, tenth and eleventh rib fractures. 2. Moderate to large hiatal hernia. 3. Aortic atherosclerosis. 4. Emphysematous hyperinflation of the lungs. 5. Degenerative disc disease L1-2 with spurring of the endplates. Electronically Signed   By: Ashley Royalty M.D.   On: 12/29/2016 00:15   Dg Thoracic Spine 2 View  Result Date: 12/29/2016 CLINICAL DATA:  Neck pain after fall EXAM: THORACIC SPINE 2 VIEWS COMPARISON:  Lateral chest radiograph from 08/05/2016 FINDINGS: New moderate 50% compression of the T9 vertebral body is noted since prior exam. There appears be some 4 mm of retropulsion of the posterior superior corner of T9. Mild multilevel degenerative disc disease of the dorsal spine. Degenerative disc disease with vacuum disc phenomena  noted at T12-L1 and L1-2. IMPRESSION: 50% anterior compression fracture the T9 vertebral body with 4 mm of retropulsion. Electronically Signed   By: Ashley Royalty M.D.   On:  12/29/2016 00:19   Ct Thoracic Spine Wo Contrast  Result Date: 12/29/2016 CLINICAL DATA:  T9 fracture EXAM: CT THORACIC AND LUMBAR SPINE WITHOUT CONTRAST TECHNIQUE: Multidetector CT imaging of the thoracic and lumbar spine was performed without contrast. Multiplanar CT image reconstructions were also generated. COMPARISON:  Same day radiographs of the thoracic spine from 12/28/2016. Lateral CXR 08/05/2016 FINDINGS: CT THORACIC SPINE FINDINGS Alignment: Maintained thoracic curvature. Vertebrae: Burst fracture with 50% height loss of the T9 vertebral body with 3 mm of retropulsion of the posterior cortex. Eccentric vertical striations noted of the T5 through T11 vertebral bodies consistent with osteopenia. Schmorl's nodes noted along the inferior endplate of T8, T9 and O27 Paraspinal and other soft tissues: Mild paraspinal soft tissue edema about the T9 fracture. No focal paraspinal hematoma. Findings would suggest a recent fracture. Moderate-sized hiatal hernia. Coronary arteriosclerosis and aortic atherosclerosis. Disc levels: Degenerative disc disease with disc space narrowing of the included lower cervical spine from C4-5, C5-6 and C6-7. No focal thoracic spine disc herniations. Schmorl's nodes along the inferior endplates of T7, T8, O35 and T11. CT LUMBAR SPINE FINDINGS Segmentation: 5 lumbar type vertebrae. Alignment: Dextroscoliosis of the lumbar spine with apex at L3. Vertebrae: No acute fracture. Paraspinal and other soft tissues: No paraspinal soft tissue hematoma or mass. No adenopathy. Disc levels: Schmorl's nodes noted at T11 and T12. There is degenerative disc disease at all levels of the lumbar spine with associated facet arthropathy. Left lateral disc bulge at L2-3. There is grade 1 retrolisthesis of L2 on L3, L3 on L4 and L4 and L5. IMPRESSION: CT THORACIC SPINE IMPRESSION Burst type fracture with 50% height loss of the T9 vertebral body with 3 mm retropulsion causing mild bony central canal  stenosis. CT LUMBAR SPINE IMPRESSION Dextroscoliosis with lumbar spondylosis. Retrolisthesis of L2 on L3, L3 on L4 and L4 on L5 grade 1. No acute lumbar spine fracture. Electronically Signed   By: Ashley Royalty M.D.   On: 12/29/2016 01:37   Ct Lumbar Spine Wo Contrast  Result Date: 12/29/2016 CLINICAL DATA:  T9 fracture EXAM: CT THORACIC AND LUMBAR SPINE WITHOUT CONTRAST TECHNIQUE: Multidetector CT imaging of the thoracic and lumbar spine was performed without contrast. Multiplanar CT image reconstructions were also generated. COMPARISON:  Same day radiographs of the thoracic spine from 12/28/2016. Lateral CXR 08/05/2016 FINDINGS: CT THORACIC SPINE FINDINGS Alignment: Maintained thoracic curvature. Vertebrae: Burst fracture with 50% height loss of the T9 vertebral body with 3 mm of retropulsion of the posterior cortex. Eccentric vertical striations noted of the T5 through T11 vertebral bodies consistent with osteopenia. Schmorl's nodes noted along the inferior endplate of T8, T9 and K09 Paraspinal and other soft tissues: Mild paraspinal soft tissue edema about the T9 fracture. No focal paraspinal hematoma. Findings would suggest a recent fracture. Moderate-sized hiatal hernia. Coronary arteriosclerosis and aortic atherosclerosis. Disc levels: Degenerative disc disease with disc space narrowing of the included lower cervical spine from C4-5, C5-6 and C6-7. No focal thoracic spine disc herniations. Schmorl's nodes along the inferior endplates of T7, T8, F81 and T11. CT LUMBAR SPINE FINDINGS Segmentation: 5 lumbar type vertebrae. Alignment: Dextroscoliosis of the lumbar spine with apex at L3. Vertebrae: No acute fracture. Paraspinal and other soft tissues: No paraspinal soft tissue hematoma or mass. No adenopathy. Disc levels: Schmorl's nodes noted at  T11 and T12. There is degenerative disc disease at all levels of the lumbar spine with associated facet arthropathy. Left lateral disc bulge at L2-3. There is grade 1  retrolisthesis of L2 on L3, L3 on L4 and L4 and L5. IMPRESSION: CT THORACIC SPINE IMPRESSION Burst type fracture with 50% height loss of the T9 vertebral body with 3 mm retropulsion causing mild bony central canal stenosis. CT LUMBAR SPINE IMPRESSION Dextroscoliosis with lumbar spondylosis. Retrolisthesis of L2 on L3, L3 on L4 and L4 on L5 grade 1. No acute lumbar spine fracture. Electronically Signed   By: Ashley Royalty M.D.   On: 12/29/2016 01:37   Dg Abdomen Acute W/chest  Result Date: 12/31/2016 CLINICAL DATA:  83 year old presenting with generalized weakness and constipation. EXAM: DG ABDOMEN ACUTE W/ 1V CHEST COMPARISON:  CT abdomen and pelvis 09/19/2004. Chest x-rays 08/05/2016, 04/14/2016 and earlier. FINDINGS: Gas within normal-caliber loops of small bowel throughout the abdomen and pelvis. Gas and expected stool burden throughout normal caliber colon. No visible opaque urinary tract calculi. Surgical clips in the right upper quadrant from prior cholecystectomy. No evidence of free intraperitoneal air or significant air-fluid levels on the erect image. Aortoiliofemoral atherosclerosis. Lumbar dextroscoliosis. Severe degenerative changes throughout the lumbar spine. Severe osseous demineralization. Cardiac silhouette moderately enlarged for AP technique, increased in size since 2017. Thoracic aorta atherosclerotic, unchanged. Large hiatal hernia, increased in size. Paratracheal opacity in the superior mediastinum is likely related to the combination of thyroid gland enlargement and mediastinal fat better seen on the AP image. Lungs clear. Bronchovascular markings normal. Pulmonary vascularity normal. No visible pleural effusions. No pneumothorax. IMPRESSION: 1. No acute abdominal abnormality. 2. Moderate cardiomegaly, with interval increase in heart size since November, 2017. 3.  No acute cardiopulmonary disease. 4. Large hiatal hernia. Electronically Signed   By: Evangeline Dakin M.D.   On: 12/31/2016  19:31    Assessment and Plan  THORACIC BACK PAIN /T 9 FRACTURE- pt says back pain is not relieved - pain meds wears off. He would like to have it avalaible every 4 hours instead of every 6 hours; also will add robaxin to his regimen.   Noah Delaine. Sheppard Coil, MD

## 2017-01-12 ENCOUNTER — Encounter: Payer: Self-pay | Admitting: Internal Medicine

## 2017-01-13 ENCOUNTER — Encounter: Payer: Self-pay | Admitting: Internal Medicine

## 2017-01-16 ENCOUNTER — Other Ambulatory Visit: Payer: Self-pay | Admitting: Licensed Clinical Social Worker

## 2017-01-16 NOTE — Patient Outreach (Signed)
  [facility]) Care Management  01/16/2017  DOLTON SHAKER III 11/29/1928 [id number]  Assessment- CSW completed call to [facility] and spoke to Education officer, museum. Social worker stated that the plan is for patient to discharge back home from facility. She shares that patient is wanting to live independently and therapist have no stated that patient requires 24/7 care. She reports that family is wanting for patient to be placed at ALF but that therapist has not recommended that. She suggests for CSW to complete call in one week for additional updates related to discharge.   Plan-CSW will continue to follow patient while he is at [facility] and will provide social work services and assistance.  Eula Fried, BSW, MSW, [email or web address] Phone: [phone] Fax: [phone]

## 2017-01-21 ENCOUNTER — Other Ambulatory Visit: Payer: Self-pay | Admitting: Licensed Clinical Social Worker

## 2017-01-21 DIAGNOSIS — M546 Pain in thoracic spine: Secondary | ICD-10-CM | POA: Diagnosis not present

## 2017-01-21 DIAGNOSIS — M8580 Other specified disorders of bone density and structure, unspecified site: Secondary | ICD-10-CM | POA: Diagnosis not present

## 2017-01-21 DIAGNOSIS — S22071A Stable burst fracture of T9-T10 vertebra, initial encounter for closed fracture: Secondary | ICD-10-CM | POA: Diagnosis not present

## 2017-01-21 NOTE — Patient Outreach (Signed)
[facility]) Care Management  01/21/2017  Judy Goodenow Spath III 03/25/1929 [id number]   Assessment- CSW received in basket message from [facility] stating that she was at [employer] today and that patient stated that he wants to return back home by this weekend. There is some conflict with this as family wish for him to go to ALF abut he is not agreeable to this. CSW sent secure email to Ona (SNF social worker) at [email or web address] requesting further information about expected discharge.   Plan-CSW will await to hear back from SNF social worker or will complete call to SNF within 48 hours. CSW will continue to assist patient with discharge planning.  [employer], BSW, MSW, [email or web address] Phone: [phone] Fax: [phone]

## 2017-01-22 ENCOUNTER — Other Ambulatory Visit: Payer: Self-pay | Admitting: Licensed Clinical Social Worker

## 2017-01-22 NOTE — Patient Outreach (Signed)
[facility]) Care Management  01/22/2017  Peter Wolfe Sep 14, 1929 [id number]   Assessment- CSW received return email from SNF social worker. She reports that patient's last day at SNF will be 01/24/17. CSW requested return email with what orders will be recommended post discharge.  Plan-CSW will continue to follow case and provide social work support to ensure a safe and stable discharge back home.  Eula Fried, BSW, MSW, [email or web address] Phone: [phone] Fax: [phone]

## 2017-01-25 ENCOUNTER — Encounter: Payer: Self-pay | Admitting: Internal Medicine

## 2017-01-26 ENCOUNTER — Other Ambulatory Visit: Payer: Self-pay | Admitting: Licensed Clinical Social Worker

## 2017-01-26 NOTE — Patient Outreach (Signed)
[place] [facility]) Care Management  01/26/2017  Peter Wolfe June 26, 1929 [id number]   Assessment- CSW completed outreach call to patient on 01/26/17 to assist with transition back home from SNF. CSW was unable to reach patient but was able to leave a HIPPA compliant voice message encouraging return call. CSW sent a SECURE email to SNF discharge planner requesting additional updates in regards to discharge. CSW also made an order to [facility].  Plan-CSW will make an additional outreach attempt within one week if no return call has been placed.  Eula Fried, BSW, MSW, [email or web address] Phone: [phone] Fax: [phone]

## 2017-01-26 NOTE — Patient Outreach (Addendum)
[facility]) Care Management  01/26/2017  Peter Wolfe 02-Oct-1928 [id number]   Assessment- CSW received incoming call from SNF social worker [place] on 01/26/17. She reports that patient's insurance has agreed to pay for more days at [facility]. Therefore, patient is still at facility and there are no plans for discharge at this point. Vikki Ports shares that she has talked to patient's niece several times. She shares that patient's niece is very upset that staff cannot convince patient to go to ALF. Chelsea reports that patient is very coherent and is able to make his own decisions for himself. Vikki Ports shares that she has had several conversations with patient and patient was looking into relocating to a 1 bedroom apartment to save money. She shares that she has reviewed his budget with him many times and has reviewed life skills to manage his expenses. Chelsea agrees to continue to update CSW as needed.  Plan-CSW will continue to follow patient while in SNF.  Eula Fried, BSW, MSW, [email or web address] Phone: [phone] Fax: [phone]

## 2017-01-27 ENCOUNTER — Ambulatory Visit: Payer: Self-pay | Admitting: Licensed Clinical Social Worker

## 2017-01-30 ENCOUNTER — Encounter: Payer: Self-pay | Admitting: Internal Medicine

## 2017-01-30 ENCOUNTER — Other Ambulatory Visit: Payer: Self-pay | Admitting: Licensed Clinical Social Worker

## 2017-01-30 ENCOUNTER — Non-Acute Institutional Stay (SKILLED_NURSING_FACILITY): Payer: Medicare Other | Admitting: Internal Medicine

## 2017-01-30 DIAGNOSIS — I10 Essential (primary) hypertension: Secondary | ICD-10-CM | POA: Diagnosis not present

## 2017-01-30 DIAGNOSIS — N400 Enlarged prostate without lower urinary tract symptoms: Secondary | ICD-10-CM

## 2017-01-30 DIAGNOSIS — K219 Gastro-esophageal reflux disease without esophagitis: Secondary | ICD-10-CM | POA: Diagnosis not present

## 2017-01-30 NOTE — Progress Notes (Signed)
Location:  [place] Room Number: 826E Place of Service:  SNF (31)  Noah Delaine. Sheppard Coil, MD  Patient Care Team: [name], NP as PCP - General [facility] Medicine)  Extended Emergency Contact Information Primary Emergency Contact: Jake Seats, [place] [address] Johnnette Litter of [place] Phone: [phone] Mobile Phone: [phone] Relation: Friend Secondary Emergency Contact: Cox,Mary  [place] Phone: [phone] Relation: Niece    Allergies: Influenza virus vacc split pf; Doxycycline; and Eggs or egg-derived products  Chief Complaint  Patient presents with  . Medical Management of Chronic Issues    Routine Visit    HPI: Patient is 83 y.o. male who is being seen for routine issues of HTN, GERD and BPH.  Past Medical History:  Diagnosis Date  . BPH (benign prostatic hyperplasia)   . Broken rib   . COPD (chronic obstructive pulmonary disease) ([place])   . GERD (gastroesophageal reflux disease)   . GERD (gastroesophageal reflux disease)   . Hyperlipidemia   . Hypertension   . Insomnia   . Panic attack     Past Surgical History:  Procedure Laterality Date  . CHOLECYSTECTOMY      Allergies as of 01/30/2017      Reactions   Influenza Virus Vacc Split Pf Other (See Comments)   Allergic to eggs   Doxycycline Rash   Eggs Or Egg-derived Products Rash      Medication List       Accurate as of 01/30/17 11:59 PM. Always use your most recent med list.          albuterol 108 (90 Base) MCG/ACT inhaler Commonly known as:  PROVENTIL HFA;VENTOLIN HFA Inhale 2 puffs into the lungs every 6 (six) hours as needed.   bisacodyl 10 MG suppository Commonly known as:  DULCOLAX Place 1 suppository (10 mg total) rectally daily as needed for moderate constipation.   busPIRone 30 MG tablet Commonly known as:  BUSPAR TAKE 1 TABLET DAILY   cloNIDine 0.1 MG tablet Commonly known as:  CATAPRES TAKE 1 TABLET  DAILY   FLUoxetine 40 MG capsule Commonly known as:  PROZAC Take 1 capsule (40 mg total) by mouth daily.   HYDROcodone-acetaminophen 5-325 MG tablet Commonly known as:  NORCO/VICODIN Take 1 tablet by mouth every 6 (six) hours as needed for moderate pain.   lidocaine 5 % Commonly known as:  LIDODERM Place 2 patches onto the skin daily. Remove & Discard patch within 12 hours or as directed by MD   methocarbamol 500 MG tablet Commonly known as:  ROBAXIN Take 500 mg by mouth every 6 (six) hours as needed.   methylPREDNISolone 2 MG tablet Commonly known as:  MEDROL Take 1 tablet (2 mg total) by mouth daily. For 5days then STOP   mirtazapine 45 MG tablet Commonly known as:  REMERON Take 1 tablet (45 mg total) by mouth at bedtime.   multivitamin capsule Take 1 capsule by mouth daily.   omeprazole 40 MG capsule Commonly known as:  PRILOSEC Take 1 capsule (40 mg total) by mouth daily.   polyethylene glycol packet Commonly known as:  MIRALAX / GLYCOLAX Take 17 g by mouth 2 (two) times daily.   senna 8.6 MG Tabs tablet Commonly known as:  SENOKOT Take 2 tablets (17.2 mg total) by mouth 2 (two) times daily.   simvastatin 10 MG tablet Commonly known as:  ZOCOR TAKE 1 TABLET AT BEDTIME  tamsulosin 0.4 MG Caps capsule Commonly known as:  FLOMAX TAKE 1 CAPSULE DAILY   traZODone 50 MG tablet Commonly known as:  DESYREL Take 50 mg by mouth at bedtime.       Meds ordered this encounter  Medications  . traZODone (DESYREL) 50 MG tablet    Sig: Take 50 mg by mouth at bedtime.  Marland Kitchen DISCONTD: methocarbamol (ROBAXIN) 500 MG tablet    Sig: Take 500 mg by mouth every 6 (six) hours as needed.   Marland Kitchen DISCONTD: polyethylene glycol (MIRALAX / GLYCOLAX) packet    Sig: Take 17 g by mouth 2 (two) times daily.    Immunization History  Administered Date(s) Administered  . PPD Test 01/06/2017  . Pneumococcal Conjugate-13 06/15/2014  . Pneumococcal Polysaccharide-23 09/29/2005  . Td  09/29/2000  . Tdap 06/11/2011  . Zoster 06/14/2008    Social History  Substance Use Topics  . Smoking status: Never Smoker  . Smokeless tobacco: Never Used  . Alcohol use 0.0 oz/week     Comment: Wine with meals     Review of Systems  DATA OBTAINED: from patient, nurse GENERAL:  no fevers, fatigue, appetite changes SKIN: No itching, rash HEENT: No complaint RESPIRATORY: No cough, wheezing, SOB CARDIAC: No chest pain, palpitations, lower extremity edema  GI: No abdominal pain, No N/V/D or constipation, No heartburn or reflux  GU: No dysuria, frequency or urgency, or incontinence  MUSCULOSKELETAL: No unrelieved bone/joint pain NEUROLOGIC: No headache, dizziness  PSYCHIATRIC: No overt anxiety or sadness  Vitals:   01/30/17 0922  BP: 123/75  Pulse: 74  Resp: 18  Temp: 98 F (36.7 C)   Body mass index is 22.82 kg/m. Physical Exam  GENERAL APPEARANCE: Alert, conversant, No acute distress  SKIN: No diaphoresis rash HEENT: Unremarkable RESPIRATORY: Breathing is even, unlabored. Lung sounds are clear   CARDIOVASCULAR: Heart RRR no murmurs, rubs or gallops. No peripheral edema  GASTROINTESTINAL: Abdomen is soft, non-tender, not distended w/ normal bowel sounds.  GENITOURINARY: Bladder non tender, not distended  MUSCULOSKELETAL: wearing brace NEUROLOGIC: Cranial nerves 2-12 grossly intact. Moves all extremities PSYCHIATRIC: Mood and affect appropriate to situation, no behavioral issues  Patient Active Problem List   Diagnosis Date Noted  . BPH (benign prostatic hyperplasia) 02/23/2017  . Anemia, normocytic normochromic 02/02/2017  . Thrombocytopenia ([place]) 01/10/2017  . Anxiety 01/10/2017  . Hyponatremia 12/31/2016  . Hypokalemia 12/31/2016  . Normocytic anemia 12/31/2016  . Fall 12/29/2016  . Closed fracture of posterior thoracic vertebral body, with routine healing, subsequent encounter 12/29/2016  . Back pain 12/29/2016  . CKD (chronic kidney disease), stage III  12/29/2016  . Depression 06/03/2016  . History of fractured rib 11/28/2015  . History of peripheral edema 11/28/2015  . Seborrheic keratosis, inflamed 08/13/2015  . Actinic keratosis 08/13/2015  . Essential hypertension, benign 07/31/2015  . URTICARIA 11/13/2007  . Hyperlipidemia 06/08/2007  . GERD 06/08/2007  . PANIC ATTACK 05/07/2007  . COPD 05/07/2007    CMP     Component Value Date/Time   NA 132 (L) 02/20/2017 1348   NA 138 01/09/2017   K 4.1 02/20/2017 1348   CL 98 02/20/2017 1348   CO2 28 02/20/2017 1348   GLUCOSE 96 02/20/2017 1348   BUN 16 02/20/2017 1348   BUN 22 (A) 01/09/2017   CREATININE 1.29 02/20/2017 1348   CALCIUM 9.8 02/20/2017 1348   PROT 9.6 (H) 12/29/2016 0827   ALBUMIN 3.3 (L) 12/29/2016 0827   AST 19 12/29/2016 0827   ALT 17 12/29/2016  0827   ALKPHOS 77 12/29/2016 0827   BILITOT 1.2 12/29/2016 0827   GFRNONAA 44 (L) 01/04/2017 0248   GFRAA 51 (L) 01/04/2017 0248    Recent Labs  04/03/16 1145  01/03/17 0401  01/04/17 0248 01/09/17 02/20/17 1348  NA 132*  < > 133*  < > 134* 138 132*  K 3.5  < > 3.5  --  3.3* 3.7 4.1  CL 96  < > 99*  --  101  --  98  CO2 28  < > 25  --  27  --  28  GLUCOSE 110*  < > 106*  --  89  --  96  BUN 15  < > 22*  < > 19 22* 16  CREATININE 1.14  < > 1.52*  < > 1.40* 1.2 1.29  CALCIUM 9.7  < > 8.7*  --  8.7*  --  9.8  MG 1.7  --   --   --   --   --   --   < > = values in this interval not displayed.  Recent Labs  04/03/16 1145 12/29/16 0827  AST 16 19  ALT 16 17  ALKPHOS 41 77  BILITOT 1.0 1.2  PROT 9.0* 9.6*  ALBUMIN 3.9 3.3*    Recent Labs  04/03/16 1145 12/29/16 0111  01/03/17 0401  01/04/17 0248 01/09/17 02/20/17 1348  WBC 4.2 8.1  8.1  < > 5.6  < > 4.9 5.7 4.9  NEUTROABS 2.2 5.1  --   --   --   --   --  2.4  HGB 12.6* 10.5*  10.5*  < > 8.4*  --  8.5* 9.3* 10.4*  HCT 36.5* 29.7*  30*  < > 25.0*  --  25.7* 28* 30.4*  MCV 100.5* 97.1  < > 98.4  --  98.5  --  97.8  PLT 154.0 126*  126*  < >  136*  --  152 175 203.0  < > = values in this interval not displayed. No results for input(s): CHOL, LDLCALC, TRIG in the last 8760 hours.  Invalid input(s): HCL No results found for: [facility] Lab Results  Component Value Date   TSH 2.857 01/01/2017   No results found for: HGBA1C Lab Results  Component Value Date   CHOL 152 07/31/2015   HDL 51.00 07/31/2015   LDLCALC 75 07/31/2015   LDLDIRECT 129.6 06/11/2011   TRIG 129.0 07/31/2015   CHOLHDL 3 07/31/2015    Significant Diagnostic Results in last 30 days:  No results found.  Assessment and Plan  Essential hypertension, benign Controlled; cont clonidine 0.1 mg daily  GERD Chronic and stable;plan to cont omeprazole 40 mg daily  BPH (benign prostatic hyperplasia) Pt doing well; plan to cont flomax 0.4 mg daily    Anne D. Sheppard Coil, MD

## 2017-01-30 NOTE — Patient Outreach (Signed)
  [facility]) Care Management  01/30/2017  Peter Wolfe Jan 07, 1929 [id number] Assessment- CSW received update from [place] that patient's new set discharge date is 02/03/17. CSW will continue to follow case and await for SNF discharge back home.  Plan-CSW will follow up within one week.  Eula Fried, BSW, MSW, [email or web address] Phone: [phone] Fax: [phone]

## 2017-02-02 ENCOUNTER — Encounter: Payer: Self-pay | Admitting: Internal Medicine

## 2017-02-02 ENCOUNTER — Non-Acute Institutional Stay (SKILLED_NURSING_FACILITY): Payer: Medicare Other | Admitting: Internal Medicine

## 2017-02-02 DIAGNOSIS — F419 Anxiety disorder, unspecified: Secondary | ICD-10-CM

## 2017-02-02 DIAGNOSIS — N390 Urinary tract infection, site not specified: Secondary | ICD-10-CM | POA: Diagnosis not present

## 2017-02-02 DIAGNOSIS — M546 Pain in thoracic spine: Secondary | ICD-10-CM | POA: Diagnosis not present

## 2017-02-02 DIAGNOSIS — D649 Anemia, unspecified: Secondary | ICD-10-CM

## 2017-02-02 DIAGNOSIS — S22009D Unspecified fracture of unspecified thoracic vertebra, subsequent encounter for fracture with routine healing: Secondary | ICD-10-CM | POA: Diagnosis not present

## 2017-02-02 DIAGNOSIS — I10 Essential (primary) hypertension: Secondary | ICD-10-CM | POA: Diagnosis not present

## 2017-02-02 DIAGNOSIS — E871 Hypo-osmolality and hyponatremia: Secondary | ICD-10-CM | POA: Diagnosis not present

## 2017-02-02 DIAGNOSIS — D696 Thrombocytopenia, unspecified: Secondary | ICD-10-CM

## 2017-02-02 DIAGNOSIS — E860 Dehydration: Secondary | ICD-10-CM | POA: Diagnosis not present

## 2017-02-02 DIAGNOSIS — E876 Hypokalemia: Secondary | ICD-10-CM

## 2017-02-02 DIAGNOSIS — F3341 Major depressive disorder, recurrent, in partial remission: Secondary | ICD-10-CM

## 2017-02-02 NOTE — Progress Notes (Signed)
Location:  [place] Room Number: 093G Place of Service:  SNF (31) Peter Wolfe. Peter Coil, MD  PCP: [name], NP Patient Care Team: [name], NP as PCP - General (Family Medicine) Greg Cutter, LCSW as [place] Management (Licensed Clinical Social Worker)  Extended Emergency Contact Information Primary Emergency Contact: [place], [place] [address] Johnnette Litter of [place] Phone: [phone] Mobile Phone: [phone] Relation: Friend Secondary Emergency Contact: Cox,Mary  [place] Phone: [phone] Relation: Niece  Allergies  Allergen Reactions  . Influenza Virus Vacc Split Pf Other (See Comments)    Allergic to eggs  . Doxycycline Rash  . Eggs Or Egg-Derived Products Rash    Chief Complaint  Patient presents with  . Discharge Note    Discharged from SNF    HPI:  83 y.o. male  with hypertension, GERD, depression, anxiety, and recent fall with thoracic compression fracture and resulting pain who presents to the emergency department with intractable pain in the back and bilateral ribs with inability to get up from bed or care from himself secondary to this. Patient was admitted briefly on 12/29/2016 after suffering a thoracic compression fracture. Neurosurgery was consulted and advised conservative management and the patient was discharged with pain medications. He was also noted to have an acute kidney injury during that admission and his NSAID and diuretics were discontinued at time of discharge. Since returning home, his pain has been on bearable despite his use of the prescribed Norco, worse with any movement and he has essentially been bedbound since returning home. He lives alone and hasn't been able to care for himself secondary to this. Chemistry panel noteda sodium of 120, potassium 3.3, BUN 24, and serum creatinine 1.51 Pt was admitted to [facility] from 4/4-9 where he  was treated for pain control and for dehydration with AKI. Hospital course was complicated by a possible UTI, tx with antibiotics. Pt is admitted to SNF for further support and OT/PT. Pt is now ready to be d/c to home.    Past Medical History:  Diagnosis Date  . BPH (benign prostatic hyperplasia)   . Broken rib   . COPD (chronic obstructive pulmonary disease) ([place])   . GERD (gastroesophageal reflux disease)   . GERD (gastroesophageal reflux disease)   . Hyperlipidemia   . Hypertension   . Insomnia   . Panic attack     Past Surgical History:  Procedure Laterality Date  . CHOLECYSTECTOMY       reports that he has never smoked. He has never used smokeless tobacco. He reports that he drinks alcohol. He reports that he does not use drugs. Social History   Social History  . Marital status: Single    Spouse name: N/A  . Number of children: N/A  . Years of education: N/A   Occupational History  . Not on file.   Social History Main Topics  . Smoking status: Never Smoker  . Smokeless tobacco: Never Used  . Alcohol use 0.0 oz/week     Comment: Wine with meals   . Drug use: No  . Sexual activity: Not on file   Other Topics Concern  . Not on file   Social History Narrative   Retired - Sgt, [employer]    Not married              Pertinent  Health Maintenance Due  Topic Date Due  .  PNA vac Low Risk Adult  Completed    Medications: Allergies as of 02/02/2017      Reactions   Influenza Virus Vacc Split Pf Other (See Comments)   Allergic to eggs   Doxycycline Rash   Eggs Or Egg-derived Products Rash      Medication List       Accurate as of 02/02/17  4:25 PM. Always use your most recent med list.          albuterol 108 (90 Base) MCG/ACT inhaler Commonly known as:  PROVENTIL HFA;VENTOLIN HFA Inhale 2 puffs into the lungs every 6 (six) hours as needed.   bisacodyl 10 MG suppository Commonly known as:  DULCOLAX Place 1 suppository (10 mg total) rectally daily as  needed for moderate constipation.   busPIRone 30 MG tablet Commonly known as:  BUSPAR TAKE 1 TABLET DAILY   cloNIDine 0.1 MG tablet Commonly known as:  CATAPRES TAKE 1 TABLET DAILY   FLUoxetine 40 MG capsule Commonly known as:  PROZAC Take 1 capsule (40 mg total) by mouth daily.   HYDROcodone-acetaminophen 5-325 MG tablet Commonly known as:  NORCO/VICODIN Take 1 tablet by mouth every 6 (six) hours as needed for moderate pain.   lidocaine 5 % Commonly known as:  LIDODERM Place 2 patches onto the skin daily. Remove & Discard patch within 12 hours or as directed by MD   methocarbamol 500 MG tablet Commonly known as:  ROBAXIN Take 500 mg by mouth every 6 (six) hours.   methylPREDNISolone 2 MG tablet Commonly known as:  MEDROL Take 1 tablet (2 mg total) by mouth daily. For 5days then STOP   mirtazapine 45 MG tablet Commonly known as:  REMERON Take 1 tablet (45 mg total) by mouth at bedtime.   multivitamin capsule Take 1 capsule by mouth daily.   omeprazole 40 MG capsule Commonly known as:  PRILOSEC Take 1 capsule (40 mg total) by mouth daily.   polyethylene glycol packet Commonly known as:  MIRALAX / GLYCOLAX Take 17 g by mouth 2 (two) times daily.   senna 8.6 MG Tabs tablet Commonly known as:  SENOKOT Take 2 tablets (17.2 mg total) by mouth 2 (two) times daily.   simvastatin 10 MG tablet Commonly known as:  ZOCOR TAKE 1 TABLET AT BEDTIME   tamsulosin 0.4 MG Caps capsule Commonly known as:  FLOMAX TAKE 1 CAPSULE DAILY   traZODone 50 MG tablet Commonly known as:  DESYREL Take 50 mg by mouth at bedtime.        Vitals:   02/02/17 1334  BP: 129/78  Pulse: 63  Resp: 16  Temp: 98 F (36.7 C)  SpO2: 93%  Weight: 141 lb 6.4 oz (64.1 kg)  Height: 5\' 6"  (1.676 m)   Body mass index is 22.82 kg/m.  Physical Exam  GENERAL APPEARANCE: Alert, conversant. No acute distress.  HEENT: Unremarkable. RESPIRATORY: Breathing is even, unlabored. Lung sounds are  clear   CARDIOVASCULAR: Heart RRR no murmurs, rubs or gallops. No peripheral edema.  GASTROINTESTINAL: Abdomen is soft, non-tender, not distended w/ normal bowel sounds.  NEUROLOGIC: Cranial nerves 2-12 grossly intact. Moves all extremities   Labs reviewed: Basic Metabolic Panel:  Recent Labs  04/03/16 1145  01/02/17 0737  01/03/17 0401 01/04/17 01/04/17 0248 01/09/17  NA 132*  < > 134*  < > 133* 134* 134* 138  K 3.5  < > 3.4*  < > 3.5  --  3.3* 3.7  CL 96  < > 100*  --  99*  --  101  --   CO2 28  < > 25  --  25  --  27  --   GLUCOSE 110*  < > 85  --  106*  --  89  --   BUN 15  < > 20  < > 22* 19 19 22*  CREATININE 1.14  < > 1.73*  < > 1.52* 1.4* 1.40* 1.2  CALCIUM 9.7  < > 8.7*  --  8.7*  --  8.7*  --   MG 1.7  --   --   --   --   --   --   --   < > = values in this interval not displayed. No results found for: [facility] Liver Function Tests:  Recent Labs  04/03/16 1145 12/29/16 0827  AST 16 19  ALT 16 17  ALKPHOS 41 77  BILITOT 1.0 1.2  PROT 9.0* 9.6*  ALBUMIN 3.9 3.3*   No results for input(s): LIPASE, AMYLASE in the last 8760 hours. No results for input(s): AMMONIA in the last 8760 hours. CBC:  Recent Labs  04/03/16 1145 12/29/16 0111  01/01/17 0337  01/03/17 0401 01/04/17 01/04/17 0248 01/09/17  WBC 4.2 8.1  8.1  < > 5.3  < > 5.6 4.9 4.9 5.7  NEUTROABS 2.2 5.1  --   --   --   --   --   --   --   HGB 12.6* 10.5*  10.5*  < > 8.9*  < > 8.4*  --  8.5* 9.3*  HCT 36.5* 29.7*  30*  < > 25.3*  < > 25.0*  --  25.7* 28*  MCV 100.5* 97.1  < > 94.1  --  98.4  --  98.5  --   PLT 154.0 126*  126*  < > 122*  < > 136*  --  152 175  < > = values in this interval not displayed. Lipid No results for input(s): CHOL, HDL, LDLCALC, TRIG in the last 8760 hours. Cardiac Enzymes:  Recent Labs  12/29/16 0827  CKTOTAL 46*   BNP: No results for input(s): BNP in the last 8760 hours. CBG:  Recent Labs  01/02/17 0724 01/04/17 0726 01/05/17 0647  GLUCAP 89 92 95      Procedures and Imaging Studies During Stay: No results found.  Assessment/Plan:   Dehydration  Hyponatremia  Hypokalemia  Acute midline thoracic back pain  Closed fracture of posterior thoracic vertebral body, with routine healing, subsequent encounter  Acute lower UTI  Thrombocytopenia (HCC)  Anemia, normocytic normochromic  Anxiety  Recurrent major depressive disorder, in partial remission ([place])  Essential hypertension, benign   Patient is being discharged with the following home health services:  OT/PT/Nursing  Patient is being discharged with the following durable medical equipment:  Rolling walker  Patient has been advised to f/u with their PCP in 1-2 weeks to bring them up to date on their rehab stay.  Social services at facility was responsible for arranging this appointment.  Pt was provided with a 30 day supply of prescriptions for medications and refills must be obtained from their PCP.  For controlled substances, a more limited supply may be provided adequate until PCP appointment only.  Medications have been reconciled.   Time spent . 30 min;> 50% of time with patient was spent reviewing records, labs, tests and studies, counseling and developing plan of care  Peter Wolfe. Peter Coil, MD

## 2017-02-04 ENCOUNTER — Other Ambulatory Visit: Payer: Self-pay | Admitting: Licensed Clinical Social Worker

## 2017-02-04 ENCOUNTER — Encounter: Payer: Self-pay | Admitting: Licensed Clinical Social Worker

## 2017-02-04 NOTE — Patient Outreach (Signed)
[facility]) Care Management  02/04/2017  Peter Wolfe Feb 15, 1929 [id number]  Assessment- CSW completed outreach call to patient to assist with transition back home from SNF. Patient answered and provided HIPPA verifications. Patient reports that he discharged back home on 02/03/17. He shares that his family is coming by this week and is assisting him with moving to a one bedroom apartment within the same complex. Patient is no longer interested in going to an Materials [facility]. CSW wished to schedule home visit as well as make referral to [facility]. However, patient shares "I have signed up for [facility] services with [facility] and I really don't need a lot of people coming in the home. I don't need anything else right now but thank you for calling." Patient denies needing [facility] services at this time and is agreeable to case closure.  Plan-CSW will complete case closure at this time and will notify [place] Management Assistant and PCP.  Eula Fried, BSW, MSW, [email or web address] Phone: [phone] Fax: [phone]

## 2017-02-05 DIAGNOSIS — K5900 Constipation, unspecified: Secondary | ICD-10-CM | POA: Diagnosis not present

## 2017-02-05 DIAGNOSIS — S22070D Wedge compression fracture of T9-T10 vertebra, subsequent encounter for fracture with routine healing: Secondary | ICD-10-CM | POA: Diagnosis not present

## 2017-02-05 DIAGNOSIS — N183 Chronic kidney disease, stage 3 (moderate): Secondary | ICD-10-CM | POA: Diagnosis not present

## 2017-02-05 DIAGNOSIS — F419 Anxiety disorder, unspecified: Secondary | ICD-10-CM | POA: Diagnosis not present

## 2017-02-05 DIAGNOSIS — F3341 Major depressive disorder, recurrent, in partial remission: Secondary | ICD-10-CM | POA: Diagnosis not present

## 2017-02-05 DIAGNOSIS — Z87440 Personal history of urinary (tract) infections: Secondary | ICD-10-CM | POA: Diagnosis not present

## 2017-02-05 DIAGNOSIS — J449 Chronic obstructive pulmonary disease, unspecified: Secondary | ICD-10-CM | POA: Diagnosis not present

## 2017-02-05 DIAGNOSIS — I129 Hypertensive chronic kidney disease with stage 1 through stage 4 chronic kidney disease, or unspecified chronic kidney disease: Secondary | ICD-10-CM | POA: Diagnosis not present

## 2017-02-05 DIAGNOSIS — Z9181 History of falling: Secondary | ICD-10-CM | POA: Diagnosis not present

## 2017-02-09 ENCOUNTER — Telehealth: Payer: Self-pay | Admitting: Adult Health

## 2017-02-09 ENCOUNTER — Other Ambulatory Visit: Payer: Self-pay | Admitting: Neurological Surgery

## 2017-02-09 DIAGNOSIS — J449 Chronic obstructive pulmonary disease, unspecified: Secondary | ICD-10-CM | POA: Diagnosis not present

## 2017-02-09 DIAGNOSIS — K5900 Constipation, unspecified: Secondary | ICD-10-CM | POA: Diagnosis not present

## 2017-02-09 DIAGNOSIS — F3341 Major depressive disorder, recurrent, in partial remission: Secondary | ICD-10-CM | POA: Diagnosis not present

## 2017-02-09 DIAGNOSIS — M8580 Other specified disorders of bone density and structure, unspecified site: Secondary | ICD-10-CM

## 2017-02-09 DIAGNOSIS — N183 Chronic kidney disease, stage 3 (moderate): Secondary | ICD-10-CM | POA: Diagnosis not present

## 2017-02-09 DIAGNOSIS — I129 Hypertensive chronic kidney disease with stage 1 through stage 4 chronic kidney disease, or unspecified chronic kidney disease: Secondary | ICD-10-CM | POA: Diagnosis not present

## 2017-02-09 DIAGNOSIS — S22070D Wedge compression fracture of T9-T10 vertebra, subsequent encounter for fracture with routine healing: Secondary | ICD-10-CM | POA: Diagnosis not present

## 2017-02-09 NOTE — Telephone Encounter (Signed)
Verbal orders given  

## 2017-02-09 NOTE — Telephone Encounter (Signed)
° °  Michelle call to ask for verbal orders to continue OT  For  2 times a week for 4 weeks and 1 times a week for 1 week    [phone]

## 2017-02-09 NOTE — Telephone Encounter (Signed)
Butch Penny w/[facility] need verbal to continue skill nursing for 1 week 1 and 2 week 3

## 2017-02-09 NOTE — Telephone Encounter (Signed)
Okay to give verbal orders or wait on Peter Wolfe?

## 2017-02-09 NOTE — Telephone Encounter (Signed)
OK 

## 2017-02-10 ENCOUNTER — Telehealth: Payer: Self-pay | Admitting: Adult Health

## 2017-02-10 DIAGNOSIS — S22070D Wedge compression fracture of T9-T10 vertebra, subsequent encounter for fracture with routine healing: Secondary | ICD-10-CM | POA: Diagnosis not present

## 2017-02-10 DIAGNOSIS — N183 Chronic kidney disease, stage 3 (moderate): Secondary | ICD-10-CM | POA: Diagnosis not present

## 2017-02-10 DIAGNOSIS — K5900 Constipation, unspecified: Secondary | ICD-10-CM | POA: Diagnosis not present

## 2017-02-10 DIAGNOSIS — F3341 Major depressive disorder, recurrent, in partial remission: Secondary | ICD-10-CM | POA: Diagnosis not present

## 2017-02-10 DIAGNOSIS — I129 Hypertensive chronic kidney disease with stage 1 through stage 4 chronic kidney disease, or unspecified chronic kidney disease: Secondary | ICD-10-CM | POA: Diagnosis not present

## 2017-02-10 DIAGNOSIS — J449 Chronic obstructive pulmonary disease, unspecified: Secondary | ICD-10-CM | POA: Diagnosis not present

## 2017-02-10 NOTE — Telephone Encounter (Signed)
OK 

## 2017-02-10 NOTE — Telephone Encounter (Signed)
Deneise Lever calling from [facility] did a eval on today and need verbal orders to continue PT for 2 weeks 6

## 2017-02-10 NOTE — Telephone Encounter (Signed)
Verbal orders given  

## 2017-02-12 DIAGNOSIS — K5900 Constipation, unspecified: Secondary | ICD-10-CM | POA: Diagnosis not present

## 2017-02-12 DIAGNOSIS — J449 Chronic obstructive pulmonary disease, unspecified: Secondary | ICD-10-CM | POA: Diagnosis not present

## 2017-02-12 DIAGNOSIS — N183 Chronic kidney disease, stage 3 (moderate): Secondary | ICD-10-CM | POA: Diagnosis not present

## 2017-02-12 DIAGNOSIS — I129 Hypertensive chronic kidney disease with stage 1 through stage 4 chronic kidney disease, or unspecified chronic kidney disease: Secondary | ICD-10-CM | POA: Diagnosis not present

## 2017-02-12 DIAGNOSIS — F3341 Major depressive disorder, recurrent, in partial remission: Secondary | ICD-10-CM | POA: Diagnosis not present

## 2017-02-12 DIAGNOSIS — S22070D Wedge compression fracture of T9-T10 vertebra, subsequent encounter for fracture with routine healing: Secondary | ICD-10-CM | POA: Diagnosis not present

## 2017-02-13 ENCOUNTER — Encounter: Payer: Medicare Other | Admitting: Adult Health

## 2017-02-13 DIAGNOSIS — N183 Chronic kidney disease, stage 3 (moderate): Secondary | ICD-10-CM | POA: Diagnosis not present

## 2017-02-13 DIAGNOSIS — J449 Chronic obstructive pulmonary disease, unspecified: Secondary | ICD-10-CM | POA: Diagnosis not present

## 2017-02-13 DIAGNOSIS — S22070D Wedge compression fracture of T9-T10 vertebra, subsequent encounter for fracture with routine healing: Secondary | ICD-10-CM | POA: Diagnosis not present

## 2017-02-13 DIAGNOSIS — F3341 Major depressive disorder, recurrent, in partial remission: Secondary | ICD-10-CM | POA: Diagnosis not present

## 2017-02-13 DIAGNOSIS — K5900 Constipation, unspecified: Secondary | ICD-10-CM | POA: Diagnosis not present

## 2017-02-13 DIAGNOSIS — I129 Hypertensive chronic kidney disease with stage 1 through stage 4 chronic kidney disease, or unspecified chronic kidney disease: Secondary | ICD-10-CM | POA: Diagnosis not present

## 2017-02-16 ENCOUNTER — Telehealth: Payer: Self-pay | Admitting: *Deleted

## 2017-02-16 DIAGNOSIS — F3341 Major depressive disorder, recurrent, in partial remission: Secondary | ICD-10-CM | POA: Diagnosis not present

## 2017-02-16 DIAGNOSIS — K5900 Constipation, unspecified: Secondary | ICD-10-CM | POA: Diagnosis not present

## 2017-02-16 DIAGNOSIS — I129 Hypertensive chronic kidney disease with stage 1 through stage 4 chronic kidney disease, or unspecified chronic kidney disease: Secondary | ICD-10-CM | POA: Diagnosis not present

## 2017-02-16 DIAGNOSIS — J449 Chronic obstructive pulmonary disease, unspecified: Secondary | ICD-10-CM | POA: Diagnosis not present

## 2017-02-16 DIAGNOSIS — N183 Chronic kidney disease, stage 3 (moderate): Secondary | ICD-10-CM | POA: Diagnosis not present

## 2017-02-16 DIAGNOSIS — S22070D Wedge compression fracture of T9-T10 vertebra, subsequent encounter for fracture with routine healing: Secondary | ICD-10-CM | POA: Diagnosis not present

## 2017-02-16 NOTE — Telephone Encounter (Signed)
Physical therapist with [facility] requesting orders for home health social worker to go out d/t cluttered home, unsanitary home, family and patient wanting to move to a single bedroom apartment but needs information and resources; family lives out of town.

## 2017-02-16 NOTE — Telephone Encounter (Signed)
OK 

## 2017-02-17 DIAGNOSIS — J449 Chronic obstructive pulmonary disease, unspecified: Secondary | ICD-10-CM | POA: Diagnosis not present

## 2017-02-17 DIAGNOSIS — I129 Hypertensive chronic kidney disease with stage 1 through stage 4 chronic kidney disease, or unspecified chronic kidney disease: Secondary | ICD-10-CM | POA: Diagnosis not present

## 2017-02-17 DIAGNOSIS — S22070D Wedge compression fracture of T9-T10 vertebra, subsequent encounter for fracture with routine healing: Secondary | ICD-10-CM | POA: Diagnosis not present

## 2017-02-17 DIAGNOSIS — K5900 Constipation, unspecified: Secondary | ICD-10-CM | POA: Diagnosis not present

## 2017-02-17 DIAGNOSIS — N183 Chronic kidney disease, stage 3 (moderate): Secondary | ICD-10-CM | POA: Diagnosis not present

## 2017-02-17 DIAGNOSIS — F3341 Major depressive disorder, recurrent, in partial remission: Secondary | ICD-10-CM | POA: Diagnosis not present

## 2017-02-17 NOTE — Telephone Encounter (Signed)
Verbal orders given  

## 2017-02-19 ENCOUNTER — Telehealth: Payer: Self-pay | Admitting: Adult Health

## 2017-02-19 DIAGNOSIS — K5900 Constipation, unspecified: Secondary | ICD-10-CM | POA: Diagnosis not present

## 2017-02-19 DIAGNOSIS — I129 Hypertensive chronic kidney disease with stage 1 through stage 4 chronic kidney disease, or unspecified chronic kidney disease: Secondary | ICD-10-CM | POA: Diagnosis not present

## 2017-02-19 DIAGNOSIS — J449 Chronic obstructive pulmonary disease, unspecified: Secondary | ICD-10-CM | POA: Diagnosis not present

## 2017-02-19 DIAGNOSIS — F3341 Major depressive disorder, recurrent, in partial remission: Secondary | ICD-10-CM | POA: Diagnosis not present

## 2017-02-19 DIAGNOSIS — N183 Chronic kidney disease, stage 3 (moderate): Secondary | ICD-10-CM | POA: Diagnosis not present

## 2017-02-19 DIAGNOSIS — S22070D Wedge compression fracture of T9-T10 vertebra, subsequent encounter for fracture with routine healing: Secondary | ICD-10-CM | POA: Diagnosis not present

## 2017-02-19 NOTE — Telephone Encounter (Signed)
Geni Bers LCSW needs a verbal order to move the evaluation to next week

## 2017-02-19 NOTE — Telephone Encounter (Signed)
Ok for verbal order  °

## 2017-02-19 NOTE — Telephone Encounter (Signed)
Peter Wolfe has been notified of verbal order via voicemail. Thanks!

## 2017-02-19 NOTE — Telephone Encounter (Signed)
Is this ok?

## 2017-02-20 ENCOUNTER — Encounter: Payer: Self-pay | Admitting: Adult Health

## 2017-02-20 ENCOUNTER — Ambulatory Visit (INDEPENDENT_AMBULATORY_CARE_PROVIDER_SITE_OTHER): Payer: Medicare Other | Admitting: Adult Health

## 2017-02-20 VITALS — BP 130/72 | Temp 98.2°F | Wt 127.4 lb

## 2017-02-20 DIAGNOSIS — N183 Chronic kidney disease, stage 3 unspecified: Secondary | ICD-10-CM

## 2017-02-20 DIAGNOSIS — D649 Anemia, unspecified: Secondary | ICD-10-CM | POA: Diagnosis not present

## 2017-02-20 LAB — CBC WITH DIFFERENTIAL/PLATELET
BASOS PCT: 0.3 % (ref 0.0–3.0)
Basophils Absolute: 0 10*3/uL (ref 0.0–0.1)
Eosinophils Absolute: 0.1 10*3/uL (ref 0.0–0.7)
Eosinophils Relative: 2.1 % (ref 0.0–5.0)
HEMATOCRIT: 30.4 % — AB (ref 39.0–52.0)
Hemoglobin: 10.4 g/dL — ABNORMAL LOW (ref 13.0–17.0)
LYMPHS ABS: 1.9 10*3/uL (ref 0.7–4.0)
LYMPHS PCT: 38.4 % (ref 12.0–46.0)
MCHC: 34.4 g/dL (ref 30.0–36.0)
MCV: 97.8 fl (ref 78.0–100.0)
MONOS PCT: 10.7 % (ref 3.0–12.0)
Monocytes Absolute: 0.5 10*3/uL (ref 0.1–1.0)
NEUTROS ABS: 2.4 10*3/uL (ref 1.4–7.7)
NEUTROS PCT: 48.5 % (ref 43.0–77.0)
PLATELETS: 203 10*3/uL (ref 150.0–400.0)
RBC: 3.11 Mil/uL — ABNORMAL LOW (ref 4.22–5.81)
RDW: 15.3 % (ref 11.5–15.5)
WBC: 4.9 10*3/uL (ref 4.0–10.5)

## 2017-02-20 LAB — BASIC METABOLIC PANEL
BUN: 16 mg/dL (ref 6–23)
CHLORIDE: 98 meq/L (ref 96–112)
CO2: 28 meq/L (ref 19–32)
Calcium: 9.8 mg/dL (ref 8.4–10.5)
Creatinine, Ser: 1.29 mg/dL (ref 0.40–1.50)
GFR: 56.13 mL/min — ABNORMAL LOW (ref >60.00)
GLUCOSE: 96 mg/dL (ref 70–99)
Potassium: 4.1 mEq/L (ref 3.5–5.1)
SODIUM: 132 meq/L — AB (ref 135–145)

## 2017-02-20 NOTE — Progress Notes (Signed)
Subjective:    Patient ID: Peter Wolfe, male    DOB: 13-Oct-1928, 83 y.o.   MRN: [id number]  HPI  83 year old male who presents to the office today for follow up after a 30 day stint at [facility] s/p closed wedge compression fracture of ninth thoracic vertebra after a fall at home. He is happy to report today that since completing rehab he had not experienced any pain. He is using a rolling walker when she is out of the apartment but when he is at home he feels as though he does not need it.   His family is coming to [facility] next week to help him clean out his apartment   Over all he feels as though " my health has improved and I am starting to feel good."    He reports that his diet has changed for the better and he is drinking 8-10 glasses of water per day and he is sleeping well. He   He denies any acute complaints today.   Review of Systems See HPI   Past Medical History:  Diagnosis Date  . BPH (benign prostatic hyperplasia)   . Broken rib   . COPD (chronic obstructive pulmonary disease) ([place])   . GERD (gastroesophageal reflux disease)   . GERD (gastroesophageal reflux disease)   . Hyperlipidemia   . Hypertension   . Insomnia   . Panic attack     Social History   Social History  . Marital status: Single    Spouse name: N/A  . Number of children: N/A  . Years of education: N/A   Occupational History  . Not on file.   Social History Main Topics  . Smoking status: Never Smoker  . Smokeless tobacco: Never Used  . Alcohol use 0.0 oz/week     Comment: Wine with meals   . Drug use: No  . Sexual activity: Not on file   Other Topics Concern  . Not on file   Social History Narrative   Retired - Sgt, Major    Not married              Past Surgical History:  Procedure Laterality Date  . CHOLECYSTECTOMY      Family History  Problem Relation Age of Onset  . Emphysema Father   . Alcohol abuse Father   . Diabetes Sister      Allergies  Allergen Reactions  . Influenza Virus Vacc Split Pf Other (See Comments)    Allergic to eggs  . Doxycycline Rash  . Eggs Or Egg-Derived Products Rash    Current Outpatient Prescriptions on File Prior to Visit  Medication Sig Dispense Refill  . albuterol (PROVENTIL HFA;VENTOLIN HFA) 108 (90 Base) MCG/ACT inhaler Inhale 2 puffs into the lungs every 6 (six) hours as needed. 1 Inhaler 0  . busPIRone (BUSPAR) 30 MG tablet TAKE 1 TABLET DAILY 100 tablet 3  . cloNIDine (CATAPRES) 0.1 MG tablet TAKE 1 TABLET DAILY 100 tablet 3  . FLUoxetine (PROZAC) 40 MG capsule Take 1 capsule (40 mg total) by mouth daily. 90 capsule 1  . mirtazapine (REMERON) 45 MG tablet Take 1 tablet (45 mg total) by mouth at bedtime. 30 tablet 3  . Multiple Vitamin (MULTIVITAMIN) capsule Take 1 capsule by mouth daily.    Marland Kitchen omeprazole (PRILOSEC) 40 MG capsule Take 1 capsule (40 mg total) by mouth daily.    . simvastatin (ZOCOR) 10 MG tablet TAKE 1 TABLET AT BEDTIME 100  tablet 3  . tamsulosin (FLOMAX) 0.4 MG CAPS capsule TAKE 1 CAPSULE DAILY 100 capsule 3  . traZODone (DESYREL) 50 MG tablet Take 50 mg by mouth at bedtime.     No current facility-administered medications on file prior to visit.     BP 130/72 (BP Location: Left Arm, Patient Position: Sitting, Cuff Size: Normal)   Temp 98.2 F (36.8 C) (Oral)   Wt 127 lb 6.4 oz (57.8 kg)   BMI 20.56 kg/m       Objective:   Physical Exam  Constitutional: He is oriented to person, place, and time. Vital signs are normal. He appears well-developed. No distress.  Thin but healthy appearing   Eyes: Conjunctivae and EOM are normal. Pupils are equal, round, and reactive to light. Right eye exhibits no discharge. Left eye exhibits no discharge. No scleral icterus.  Cardiovascular: Normal rate, regular rhythm, normal heart sounds and intact distal pulses.  Exam reveals no gallop and no friction rub.   No murmur heard. Pulmonary/Chest: Effort normal and breath  sounds normal. No respiratory distress. He has no wheezes. He has no rales. He exhibits no tenderness.  Musculoskeletal: He exhibits no edema, tenderness or deformity.  Uses a rolling walker   Neurological: He is alert and oriented to person, place, and time.  Skin: Skin is warm and dry. No rash noted. He is not diaphoretic. No erythema. No pallor.  Psychiatric: He has a normal mood and affect. His behavior is normal. Judgment and thought content normal.  Nursing note and vitals reviewed.     Assessment & Plan:  1. Anemia, normocytic normochromic  - CBC with Differential/Platelet  2. CKD (chronic kidney disease), stage III  - Basic Metabolic Panel  Follow up as needed  [name], NP

## 2017-02-20 NOTE — Patient Instructions (Signed)
It was great seeing you today!  I am proud of the progress you have made! Keep it up!   I will follow up with you regarding your blood work   Please let me know if you need anythingl

## 2017-02-23 ENCOUNTER — Encounter: Payer: Self-pay | Admitting: Internal Medicine

## 2017-02-23 DIAGNOSIS — N400 Enlarged prostate without lower urinary tract symptoms: Secondary | ICD-10-CM | POA: Insufficient documentation

## 2017-02-23 NOTE — Assessment & Plan Note (Signed)
Controlled; cont clonidine 0.1 mg daily

## 2017-02-23 NOTE — Assessment & Plan Note (Signed)
Chronic and stable;plan to cont omeprazole 40 mg daily

## 2017-02-23 NOTE — Assessment & Plan Note (Signed)
Pt doing well; plan to cont flomax 0.4 mg daily

## 2017-02-24 DIAGNOSIS — F3341 Major depressive disorder, recurrent, in partial remission: Secondary | ICD-10-CM | POA: Diagnosis not present

## 2017-02-24 DIAGNOSIS — J449 Chronic obstructive pulmonary disease, unspecified: Secondary | ICD-10-CM | POA: Diagnosis not present

## 2017-02-24 DIAGNOSIS — K5900 Constipation, unspecified: Secondary | ICD-10-CM | POA: Diagnosis not present

## 2017-02-24 DIAGNOSIS — I129 Hypertensive chronic kidney disease with stage 1 through stage 4 chronic kidney disease, or unspecified chronic kidney disease: Secondary | ICD-10-CM | POA: Diagnosis not present

## 2017-02-24 DIAGNOSIS — S22070D Wedge compression fracture of T9-T10 vertebra, subsequent encounter for fracture with routine healing: Secondary | ICD-10-CM | POA: Diagnosis not present

## 2017-02-24 DIAGNOSIS — N183 Chronic kidney disease, stage 3 (moderate): Secondary | ICD-10-CM | POA: Diagnosis not present

## 2017-02-25 DIAGNOSIS — F3341 Major depressive disorder, recurrent, in partial remission: Secondary | ICD-10-CM | POA: Diagnosis not present

## 2017-02-25 DIAGNOSIS — N183 Chronic kidney disease, stage 3 (moderate): Secondary | ICD-10-CM | POA: Diagnosis not present

## 2017-02-25 DIAGNOSIS — J449 Chronic obstructive pulmonary disease, unspecified: Secondary | ICD-10-CM | POA: Diagnosis not present

## 2017-02-25 DIAGNOSIS — S22070D Wedge compression fracture of T9-T10 vertebra, subsequent encounter for fracture with routine healing: Secondary | ICD-10-CM | POA: Diagnosis not present

## 2017-02-25 DIAGNOSIS — I129 Hypertensive chronic kidney disease with stage 1 through stage 4 chronic kidney disease, or unspecified chronic kidney disease: Secondary | ICD-10-CM | POA: Diagnosis not present

## 2017-02-25 DIAGNOSIS — K5900 Constipation, unspecified: Secondary | ICD-10-CM | POA: Diagnosis not present

## 2017-02-26 DIAGNOSIS — N183 Chronic kidney disease, stage 3 (moderate): Secondary | ICD-10-CM | POA: Diagnosis not present

## 2017-02-26 DIAGNOSIS — J449 Chronic obstructive pulmonary disease, unspecified: Secondary | ICD-10-CM | POA: Diagnosis not present

## 2017-02-26 DIAGNOSIS — K5900 Constipation, unspecified: Secondary | ICD-10-CM | POA: Diagnosis not present

## 2017-02-26 DIAGNOSIS — I129 Hypertensive chronic kidney disease with stage 1 through stage 4 chronic kidney disease, or unspecified chronic kidney disease: Secondary | ICD-10-CM | POA: Diagnosis not present

## 2017-02-26 DIAGNOSIS — F3341 Major depressive disorder, recurrent, in partial remission: Secondary | ICD-10-CM | POA: Diagnosis not present

## 2017-02-26 DIAGNOSIS — S22070D Wedge compression fracture of T9-T10 vertebra, subsequent encounter for fracture with routine healing: Secondary | ICD-10-CM | POA: Diagnosis not present

## 2017-02-27 ENCOUNTER — Telehealth: Payer: Self-pay | Admitting: Adult Health

## 2017-02-27 NOTE — Telephone Encounter (Signed)
Geni Bers with [facility] is a Environmental consultant. She did an eval today. Would like verbal orders to return to see pt  1 wk/ 2  For community resources.  She states pt is very much in need of this service.

## 2017-03-03 DIAGNOSIS — K5900 Constipation, unspecified: Secondary | ICD-10-CM | POA: Diagnosis not present

## 2017-03-03 DIAGNOSIS — F3341 Major depressive disorder, recurrent, in partial remission: Secondary | ICD-10-CM | POA: Diagnosis not present

## 2017-03-03 DIAGNOSIS — J449 Chronic obstructive pulmonary disease, unspecified: Secondary | ICD-10-CM | POA: Diagnosis not present

## 2017-03-03 DIAGNOSIS — S22070D Wedge compression fracture of T9-T10 vertebra, subsequent encounter for fracture with routine healing: Secondary | ICD-10-CM | POA: Diagnosis not present

## 2017-03-03 DIAGNOSIS — N183 Chronic kidney disease, stage 3 (moderate): Secondary | ICD-10-CM | POA: Diagnosis not present

## 2017-03-03 DIAGNOSIS — I129 Hypertensive chronic kidney disease with stage 1 through stage 4 chronic kidney disease, or unspecified chronic kidney disease: Secondary | ICD-10-CM | POA: Diagnosis not present

## 2017-03-03 NOTE — Telephone Encounter (Signed)
Geni Bers notified via voicemail that this is ok per [facility]. Thanks!

## 2017-03-03 NOTE — Telephone Encounter (Signed)
That is fine 

## 2017-03-05 DIAGNOSIS — N183 Chronic kidney disease, stage 3 (moderate): Secondary | ICD-10-CM | POA: Diagnosis not present

## 2017-03-05 DIAGNOSIS — K5900 Constipation, unspecified: Secondary | ICD-10-CM | POA: Diagnosis not present

## 2017-03-05 DIAGNOSIS — J449 Chronic obstructive pulmonary disease, unspecified: Secondary | ICD-10-CM | POA: Diagnosis not present

## 2017-03-05 DIAGNOSIS — F3341 Major depressive disorder, recurrent, in partial remission: Secondary | ICD-10-CM | POA: Diagnosis not present

## 2017-03-05 DIAGNOSIS — S22070D Wedge compression fracture of T9-T10 vertebra, subsequent encounter for fracture with routine healing: Secondary | ICD-10-CM | POA: Diagnosis not present

## 2017-03-05 DIAGNOSIS — I129 Hypertensive chronic kidney disease with stage 1 through stage 4 chronic kidney disease, or unspecified chronic kidney disease: Secondary | ICD-10-CM | POA: Diagnosis not present

## 2017-03-06 DIAGNOSIS — I129 Hypertensive chronic kidney disease with stage 1 through stage 4 chronic kidney disease, or unspecified chronic kidney disease: Secondary | ICD-10-CM | POA: Diagnosis not present

## 2017-03-06 DIAGNOSIS — N183 Chronic kidney disease, stage 3 (moderate): Secondary | ICD-10-CM | POA: Diagnosis not present

## 2017-03-06 DIAGNOSIS — J449 Chronic obstructive pulmonary disease, unspecified: Secondary | ICD-10-CM | POA: Diagnosis not present

## 2017-03-06 DIAGNOSIS — F3341 Major depressive disorder, recurrent, in partial remission: Secondary | ICD-10-CM | POA: Diagnosis not present

## 2017-03-06 DIAGNOSIS — K5900 Constipation, unspecified: Secondary | ICD-10-CM | POA: Diagnosis not present

## 2017-03-06 DIAGNOSIS — S22070D Wedge compression fracture of T9-T10 vertebra, subsequent encounter for fracture with routine healing: Secondary | ICD-10-CM | POA: Diagnosis not present

## 2017-03-09 ENCOUNTER — Telehealth: Payer: Self-pay

## 2017-03-09 DIAGNOSIS — S22070D Wedge compression fracture of T9-T10 vertebra, subsequent encounter for fracture with routine healing: Secondary | ICD-10-CM | POA: Diagnosis not present

## 2017-03-09 DIAGNOSIS — I129 Hypertensive chronic kidney disease with stage 1 through stage 4 chronic kidney disease, or unspecified chronic kidney disease: Secondary | ICD-10-CM | POA: Diagnosis not present

## 2017-03-09 DIAGNOSIS — K5900 Constipation, unspecified: Secondary | ICD-10-CM | POA: Diagnosis not present

## 2017-03-09 DIAGNOSIS — J449 Chronic obstructive pulmonary disease, unspecified: Secondary | ICD-10-CM | POA: Diagnosis not present

## 2017-03-09 DIAGNOSIS — N183 Chronic kidney disease, stage 3 (moderate): Secondary | ICD-10-CM | POA: Diagnosis not present

## 2017-03-09 DIAGNOSIS — F3341 Major depressive disorder, recurrent, in partial remission: Secondary | ICD-10-CM | POA: Diagnosis not present

## 2017-03-09 NOTE — Telephone Encounter (Signed)
Ok for verbal orders ?

## 2017-03-09 NOTE — Telephone Encounter (Signed)
[name] @ [facility] called to state that pt has met all PT/OT goals and they will see pt through end of week and then discharge. She would like to have verbal ok to discharge pt from PT/OT.  Cory - Please advise. Thanks!

## 2017-03-10 DIAGNOSIS — I129 Hypertensive chronic kidney disease with stage 1 through stage 4 chronic kidney disease, or unspecified chronic kidney disease: Secondary | ICD-10-CM | POA: Diagnosis not present

## 2017-03-10 DIAGNOSIS — K5900 Constipation, unspecified: Secondary | ICD-10-CM | POA: Diagnosis not present

## 2017-03-10 DIAGNOSIS — S22070D Wedge compression fracture of T9-T10 vertebra, subsequent encounter for fracture with routine healing: Secondary | ICD-10-CM | POA: Diagnosis not present

## 2017-03-10 DIAGNOSIS — F3341 Major depressive disorder, recurrent, in partial remission: Secondary | ICD-10-CM | POA: Diagnosis not present

## 2017-03-10 DIAGNOSIS — N183 Chronic kidney disease, stage 3 (moderate): Secondary | ICD-10-CM | POA: Diagnosis not present

## 2017-03-10 DIAGNOSIS — J449 Chronic obstructive pulmonary disease, unspecified: Secondary | ICD-10-CM | POA: Diagnosis not present

## 2017-03-10 NOTE — Telephone Encounter (Signed)
Spoke with [name] and advised. Nothing further needed at this time.

## 2017-03-11 DIAGNOSIS — I129 Hypertensive chronic kidney disease with stage 1 through stage 4 chronic kidney disease, or unspecified chronic kidney disease: Secondary | ICD-10-CM | POA: Diagnosis not present

## 2017-03-11 DIAGNOSIS — S22070D Wedge compression fracture of T9-T10 vertebra, subsequent encounter for fracture with routine healing: Secondary | ICD-10-CM | POA: Diagnosis not present

## 2017-03-11 DIAGNOSIS — F3341 Major depressive disorder, recurrent, in partial remission: Secondary | ICD-10-CM | POA: Diagnosis not present

## 2017-03-11 DIAGNOSIS — J449 Chronic obstructive pulmonary disease, unspecified: Secondary | ICD-10-CM | POA: Diagnosis not present

## 2017-03-11 DIAGNOSIS — N183 Chronic kidney disease, stage 3 (moderate): Secondary | ICD-10-CM | POA: Diagnosis not present

## 2017-03-11 DIAGNOSIS — K5900 Constipation, unspecified: Secondary | ICD-10-CM | POA: Diagnosis not present

## 2017-03-24 ENCOUNTER — Telehealth: Payer: Self-pay

## 2017-03-24 ENCOUNTER — Encounter: Payer: Self-pay | Admitting: Endocrinology

## 2017-03-24 ENCOUNTER — Ambulatory Visit (INDEPENDENT_AMBULATORY_CARE_PROVIDER_SITE_OTHER): Payer: Medicare Other | Admitting: Endocrinology

## 2017-03-24 VITALS — BP 128/84 | HR 95 | Ht 65.0 in | Wt 128.0 lb

## 2017-03-24 DIAGNOSIS — R29890 Loss of height: Secondary | ICD-10-CM

## 2017-03-24 DIAGNOSIS — R634 Abnormal weight loss: Secondary | ICD-10-CM | POA: Diagnosis not present

## 2017-03-24 DIAGNOSIS — M4850XA Collapsed vertebra, not elsewhere classified, site unspecified, initial encounter for fracture: Secondary | ICD-10-CM | POA: Insufficient documentation

## 2017-03-24 DIAGNOSIS — M8000XD Age-related osteoporosis with current pathological fracture, unspecified site, subsequent encounter for fracture with routine healing: Secondary | ICD-10-CM

## 2017-03-24 DIAGNOSIS — M8088XD Other osteoporosis with current pathological fracture, vertebra(e), subsequent encounter for fracture with routine healing: Secondary | ICD-10-CM | POA: Diagnosis not present

## 2017-03-24 LAB — TSH: TSH: 1.78 u[IU]/mL (ref 0.35–4.50)

## 2017-03-24 LAB — CBC WITH DIFFERENTIAL/PLATELET
BASOS ABS: 0 10*3/uL (ref 0.0–0.1)
Basophils Relative: 0.2 % (ref 0.0–3.0)
EOS PCT: 1.1 % (ref 0.0–5.0)
Eosinophils Absolute: 0 10*3/uL (ref 0.0–0.7)
HCT: 29.3 % — ABNORMAL LOW (ref 39.0–52.0)
HEMOGLOBIN: 10.2 g/dL — AB (ref 13.0–17.0)
LYMPHS PCT: 28.1 % (ref 12.0–46.0)
Lymphs Abs: 1.3 10*3/uL (ref 0.7–4.0)
MCHC: 35 g/dL (ref 30.0–36.0)
MCV: 96.8 fl (ref 78.0–100.0)
MONOS PCT: 10.3 % (ref 3.0–12.0)
Monocytes Absolute: 0.5 10*3/uL (ref 0.1–1.0)
Neutro Abs: 2.7 10*3/uL (ref 1.4–7.7)
Neutrophils Relative %: 60.3 % (ref 43.0–77.0)
Platelets: 158 10*3/uL (ref 150.0–400.0)
RBC: 3.03 Mil/uL — AB (ref 4.22–5.81)
RDW: 14.8 % (ref 11.5–15.5)
WBC: 4.4 10*3/uL (ref 4.0–10.5)

## 2017-03-24 LAB — COMPREHENSIVE METABOLIC PANEL
ALBUMIN: 3.7 g/dL (ref 3.5–5.2)
ALK PHOS: 42 U/L (ref 39–117)
ALT: 12 U/L (ref 0–53)
AST: 11 U/L (ref 0–37)
BUN: 20 mg/dL (ref 6–23)
CALCIUM: 9.9 mg/dL (ref 8.4–10.5)
CHLORIDE: 97 meq/L (ref 96–112)
CO2: 27 mEq/L (ref 19–32)
Creatinine, Ser: 1.32 mg/dL (ref 0.40–1.50)
GFR: 54.65 mL/min — AB (ref >60.00)
Glucose, Bld: 109 mg/dL — ABNORMAL HIGH (ref 70–99)
Potassium: 4.1 mEq/L (ref 3.5–5.1)
SODIUM: 131 meq/L — AB (ref 135–145)
Total Bilirubin: 0.4 mg/dL (ref 0.2–1.2)
Total Protein: 10 g/dL — ABNORMAL HIGH (ref 6.0–8.3)

## 2017-03-24 LAB — TESTOSTERONE: Testosterone: 521.59 ng/dL (ref 300.00–890.00)
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
## 2017-03-24 NOTE — Telephone Encounter (Signed)
[employer], [phone], to schedule bone density test. Appointment on July 5th, 2018 at [facility] patient to advise him of his appointment day and the location.

## 2017-03-26 ENCOUNTER — Other Ambulatory Visit: Payer: Self-pay | Admitting: Endocrinology

## 2017-03-26 DIAGNOSIS — IMO0001 Reserved for inherently not codable concepts without codable children: Secondary | ICD-10-CM

## 2017-03-26 DIAGNOSIS — M4850XA Collapsed vertebra, not elsewhere classified, site unspecified, initial encounter for fracture: Principal | ICD-10-CM

## 2017-03-27 ENCOUNTER — Other Ambulatory Visit: Payer: Medicare Other

## 2017-03-27 DIAGNOSIS — M4850XA Collapsed vertebra, not elsewhere classified, site unspecified, initial encounter for fracture: Secondary | ICD-10-CM | POA: Diagnosis not present

## 2017-03-27 DIAGNOSIS — IMO0001 Reserved for inherently not codable concepts without codable children: Secondary | ICD-10-CM

## 2017-03-28 LAB — PROTEIN ELECTROPHORESIS, SERUM
A/G Ratio: 0.7 (ref 0.7–1.7)
ALBUMIN ELP: 3.9 g/dL (ref 2.9–4.4)
ALPHA 1: 0.2 g/dL (ref 0.0–0.4)
Alpha 2: 0.6 g/dL (ref 0.4–1.0)
Beta: 0.7 g/dL (ref 0.7–1.3)
Gamma Globulin: 4.1 g/dL — ABNORMAL HIGH (ref 0.4–1.8)
Globulin, Total: 5.7 g/dL — ABNORMAL HIGH (ref 2.2–3.9)
M-Spike, %: 3.9 g/dL — ABNORMAL HIGH
TOTAL PROTEIN: 9.6 g/dL — AB (ref 6.0–8.5)

## 2017-03-29 NOTE — Progress Notes (Signed)
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  Need to be referred to a hematologist for further evaluation, this may be causing of his osteoporosis.  Will refer him if okay with PCP

## 2017-03-30 ENCOUNTER — Other Ambulatory Visit: Payer: Self-pay | Admitting: Endocrinology

## 2017-03-30 DIAGNOSIS — D472 Monoclonal gammopathy: Secondary | ICD-10-CM

## 2017-04-02 ENCOUNTER — Other Ambulatory Visit: Payer: Self-pay | Admitting: Endocrinology

## 2017-04-02 ENCOUNTER — Telehealth: Payer: Self-pay | Admitting: Endocrinology

## 2017-04-02 ENCOUNTER — Ambulatory Visit (INDEPENDENT_AMBULATORY_CARE_PROVIDER_SITE_OTHER)
Admission: RE | Admit: 2017-04-02 | Discharge: 2017-04-02 | Disposition: A | Discharge status: Home / Self Care | Payer: Medicare Other | Source: Ambulatory Visit | Attending: Neurological Surgery | Admitting: Neurological Surgery

## 2017-04-02 DIAGNOSIS — M8588 Other specified disorders of bone density and structure, other site: Secondary | ICD-10-CM

## 2017-04-02 DIAGNOSIS — M8000XD Age-related osteoporosis with current pathological fracture, unspecified site, subsequent encounter for fracture with routine healing: Secondary | ICD-10-CM

## 2017-04-02 DIAGNOSIS — M8580 Other specified disorders of bone density and structure, unspecified site: Secondary | ICD-10-CM

## 2017-04-02 NOTE — Telephone Encounter (Signed)
Please advise 

## 2017-04-02 NOTE — Telephone Encounter (Signed)
Patient will need to sign a waiver and use the diagnosis of osteoporosis with pathological fracture along with his calling insurance company about what diagnosis that they require

## 2017-04-02 NOTE — Telephone Encounter (Signed)
[facility] called to ask about the CPT codes for the patient's visit today. They need a call as soon as possible due to not being able to finish their part for the visit due to the code being incorrect. Call to advise as soon as possible.

## 2017-04-02 NOTE — Telephone Encounter (Signed)
Called [facility] back and gave the message from Dr. Dwyane Dee.

## 2017-04-06 NOTE — Progress Notes (Signed)
Please call to let patient know that he has definite osteoporosis, need prescription to be starting risedronate 150 mg one tablet monthly for bone building and also will see what hematologist stays about his blood protein abnormalities

## 2017-04-07 ENCOUNTER — Other Ambulatory Visit: Payer: Self-pay

## 2017-04-07 MED ORDER — RISEDRONATE SODIUM 150 MG PO TABS: 150.0000 mg | ORAL_TABLET | ORAL | 3 refills | Status: DC

## 2017-04-08 ENCOUNTER — Other Ambulatory Visit: Payer: Self-pay | Admitting: Adult Health

## 2017-04-08 DIAGNOSIS — R634 Abnormal weight loss: Secondary | ICD-10-CM

## 2017-04-09 NOTE — Progress Notes (Signed)
   12/29/16 1535  OT G-codes **NOT FOR INPATIENT CLASS**  Functional Assessment Tool Used Clinical judgement (and chart review)  Functional Limitation Self care  Self Care Current Status (V2919) CL  Self Care Goal Status [phone]) CI  [place]en Circle, OTR/L [phone] 04/09/2017

## 2017-04-09 NOTE — Progress Notes (Signed)
   12/29/16 1032  PT G-Codes **NOT FOR INPATIENT CLASS**  Functional Assessment Tool Used Clinical judgement  Functional Limitation Mobility: Walking and moving around  Mobility: Walking and Moving Around Current Status (K8003) CJ  Mobility: Walking and Moving Around Goal Status (K9179) CI  G Code entered for evaluating and documenting PT Tresa Endo .  Clide Dales, PT Pager: [phone] 04/09/2017

## 2017-04-10 ENCOUNTER — Encounter: Payer: Self-pay | Admitting: Family Medicine

## 2017-04-10 NOTE — Telephone Encounter (Signed)
error 

## 2017-04-20 NOTE — Progress Notes (Signed)
Subjective:   Peter Wolfe is a 83 y.o. male who presents for Medicare Annual/Subsequent preventive examination.  Review of Systems:  No ROS.  Medicare Wellness Visit. Additional risk factors are reflected in the social history.  Cardiac Risk Factors include: advanced age (>33men, >75 women);dyslipidemia;hypertension   Sleep patterns: Sleeps 8 hours, feels rested.  Home Safety/Smoke Alarms: Feels safe in home. Smoke alarms in place.  Living environment; residence and Firearm Safety: Lives alone in apartment (ground level).  Seat Belt Safety/Bike Helmet: Wears seat belt.   Counseling:   Eye Exam-Last exam > 2 years ago. Will make appt  Dental-Last exam > 1 years ago. Partial upper and lower.   Male:   CCS-Colonoscopy 10/19/2006, normal.      PSA-  Lab Results  Component Value Date   PSA 1.96 07/31/2015   PSA 2.67 06/13/2013   PSA 2.49 06/03/2012       Objective:    Vitals: BP 110/70 (BP Location: Left Arm, Patient Position: Sitting, Cuff Size: Normal)   Pulse 68   Ht 5\' 5"  (1.651 m)   Wt 124 lb 6.4 oz (56.4 kg)   SpO2 95%   BMI 20.70 kg/m   Body mass index is 20.7 kg/m.  Tobacco History  Smoking Status  . Never Smoker  Smokeless Tobacco  . Never Used     Counseling given: No   Past Medical History:  Diagnosis Date  . BPH (benign prostatic hyperplasia)   . Broken rib   . COPD (chronic obstructive pulmonary disease) ([place])   . GERD (gastroesophageal reflux disease)   . GERD (gastroesophageal reflux disease)   . Hyperlipidemia   . Hypertension   . Insomnia   . Panic attack    Past Surgical History:  Procedure Laterality Date  . CHOLECYSTECTOMY     Family History  Problem Relation Age of Onset  . Emphysema Father   . Alcohol abuse Father   . Diabetes Sister   . Alzheimer's disease Sister    History  Sexual Activity  . Sexual activity: Not on file    Outpatient Encounter Prescriptions as of 04/21/2017  Medication Sig  . busPIRone  (BUSPAR) 30 MG tablet TAKE 1 TABLET DAILY  . cloNIDine (CATAPRES) 0.1 MG tablet TAKE 1 TABLET DAILY  . FLUoxetine (PROZAC) 40 MG capsule TAKE ONE CAPSULE BY MOUTH ONCE DAILY  . mirtazapine (REMERON) 45 MG tablet Take 1 tablet (45 mg total) by mouth at bedtime.  . Multiple Vitamin (MULTIVITAMIN) capsule Take 1 capsule by mouth daily.  Marland Kitchen omeprazole (PRILOSEC) 40 MG capsule Take 1 capsule (40 mg total) by mouth daily.  . risedronate (ACTONEL) 150 MG tablet Take 1 tablet (150 mg total) by mouth every 30 (thirty) days. with water on empty stomach, nothing by mouth or lie down for next 30 minutes.  . simvastatin (ZOCOR) 10 MG tablet TAKE 1 TABLET AT BEDTIME  . tamsulosin (FLOMAX) 0.4 MG CAPS capsule TAKE 1 CAPSULE DAILY  . traZODone (DESYREL) 50 MG tablet Take 50 mg by mouth at bedtime.   No facility-administered encounter medications on file as of 04/21/2017.     Activities of Daily Living In your present state of health, do you have any difficulty performing the following activities: 04/21/2017 01/01/2017  Hearing? N N  Vision? N N  Difficulty concentrating or making decisions? N N  Walking or climbing stairs? Y Y  Dressing or bathing? N N  Doing errands, shopping? N N  Preparing Food and eating ?  N -  Using the Toilet? N -  In the past six months, have you accidently leaked urine? N -  Do you have problems with loss of bowel control? N -  Managing your Medications? N -  Managing your Finances? N -  Housekeeping or managing your Housekeeping? N -  Some recent data might be hidden   Balance issues make ADL's difficult.   Patient Care Team: [name], NP as PCP - General (Family Medicine) Elayne Snare, MD as Consulting Physician (Endocrinology)   Assessment:    Physical assessment deferred to PCP.  Exercise Activities and Dietary recommendations Current Exercise Habits: The patient does not participate in regular exercise at present, Exercise limited by: orthopedic condition(s)    Diet (meal preparation, eat out, water intake, caffeinated beverages, dairy products, fruits and vegetables): Drinks water, some juice.   Brunch: toast, sausage/bacon, coffee Dinner: Frozen dinner   Discussed not skipping meals.   Goals    . Gain weight          Gain weight and maintain balance.       Fall Risk Fall Risk  04/21/2017 01/08/2017 11/28/2015 11/16/2014 06/15/2014  Falls in the past year? No Yes No No No  Number falls in past yr: - 1 - - -  Injury with Fall? - Yes - - -  Risk Factor Category  - High Fall Risk - - -  Risk for fall due to : - Impaired balance/gait;Impaired mobility - - -  Follow up - Education provided - - -   Depression Screen PHQ 2/9 Scores 04/21/2017 01/08/2017 11/28/2015 11/16/2014  PHQ - 2 Score 0 1 0 1    Cognitive Function       Ad8 score reviewed for issues:  Issues making decisions: no  Less interest in hobbies / activities: no  Repeats questions, stories (family complaining): no  Trouble using ordinary gadgets (microwave, computer, phone): no  Forgets the month or year: no  Mismanaging finances: no  Remembering appts: no  Daily problems with thinking and/or memory: no Ad8 score is=0   Immunization History  Administered Date(s) Administered  . PPD Test 01/06/2017  . Pneumococcal Conjugate-13 06/15/2014  . Pneumococcal Polysaccharide-23 09/29/2005  . Td 09/29/2000  . Tdap 06/11/2011  . Zoster 06/14/2008   Screening Tests Health Maintenance  Topic Date Due  . TETANUS/TDAP  06/10/2021  . PNA vac Low Risk Adult  Completed      Plan:    Bring a copy of your advance directives to your next office visit.  Continue doing brain stimulating activities (puzzles, reading, adult coloring books, staying active) to keep memory sharp.   I have personally reviewed and noted the following in the patient's chart:   . Medical and social history . Use of alcohol, tobacco or illicit drugs  . Current medications and  supplements . Functional ability and status . Nutritional status . Physical activity . Advanced directives . List of other physicians . Hospitalizations, surgeries, and ER visits in previous 12 months . Vitals . Screenings to include cognitive, depression, and falls . Referrals and appointments  In addition, I have reviewed and discussed with patient certain preventive protocols, quality metrics, and best practice recommendations. A written personalized care plan for preventive services as well as general preventive health recommendations were provided to patient.     Gerilyn Nestle, RN  04/21/2017

## 2017-04-21 ENCOUNTER — Ambulatory Visit (INDEPENDENT_AMBULATORY_CARE_PROVIDER_SITE_OTHER): Payer: Medicare Other

## 2017-04-21 ENCOUNTER — Telehealth: Payer: Self-pay

## 2017-04-21 VITALS — BP 110/70 | HR 68 | Ht 65.0 in | Wt 124.4 lb

## 2017-04-21 DIAGNOSIS — Z0000 Encounter for general adult medical examination without abnormal findings: Secondary | ICD-10-CM | POA: Diagnosis not present

## 2017-04-21 NOTE — Telephone Encounter (Signed)
Left vm requesting patient call back and let me know if he wants to do prolia or reclast- Dr. Dwyane Dee said no to doing PA for actonel due to cost- patient is aware he needs to contact insurance to see what his out of pocket cost may be

## 2017-04-21 NOTE — Progress Notes (Signed)
I have reviewed and agree with this plan  

## 2017-04-21 NOTE — Telephone Encounter (Signed)
Patient wants to stick w/ reclast and will continue taking once a month.   Patient can not afford actonel and does not need alternative.  Please advise.  Thank you,  -LL

## 2017-04-21 NOTE — Patient Instructions (Addendum)
Bring a copy of your advance directives to your next office visit.  Continue doing brain stimulating activities (puzzles, reading, adult coloring books, staying active) to keep memory sharp.     Fall Prevention in the Home Falls can cause injuries. They can happen to people of all ages. There are many things you can do to make your home safe and to help prevent falls. What can I do on the outside of my home?  Regularly fix the edges of walkways and driveways and fix any cracks.  Remove anything that might make you trip as you walk through a door, such as a raised step or threshold.  Trim any bushes or trees on the path to your home.  Use bright outdoor lighting.  Clear any walking paths of anything that might make someone trip, such as rocks or tools.  Regularly check to see if handrails are loose or broken. Make sure that both sides of any steps have handrails.  Any raised decks and porches should have guardrails on the edges.  Have any leaves, snow, or ice cleared regularly.  Use sand or salt on walking paths during winter.  Clean up any spills in your garage right away. This includes oil or grease spills. What can I do in the bathroom?  Use night lights.  Install grab bars by the toilet and in the tub and shower. Do not use towel bars as grab bars.  Use non-skid mats or decals in the tub or shower.  If you need to sit down in the shower, use a plastic, non-slip stool.  Keep the floor dry. Clean up any water that spills on the floor as soon as it happens.  Remove soap buildup in the tub or shower regularly.  Attach bath mats securely with double-sided non-slip rug tape.  Do not have throw rugs and other things on the floor that can make you trip. What can I do in the bedroom?  Use night lights.  Make sure that you have a light by your bed that is easy to reach.  Do not use any sheets or blankets that are too big for your bed. They should not hang down onto the  floor.  Have a firm chair that has side arms. You can use this for support while you get dressed.  Do not have throw rugs and other things on the floor that can make you trip. What can I do in the kitchen?  Clean up any spills right away.  Avoid walking on wet floors.  Keep items that you use a lot in easy-to-reach places.  If you need to reach something above you, use a strong step stool that has a grab bar.  Keep electrical cords out of the way.  Do not use floor polish or wax that makes floors slippery. If you must use wax, use non-skid floor wax.  Do not have throw rugs and other things on the floor that can make you trip. What can I do with my stairs?  Do not leave any items on the stairs.  Make sure that there are handrails on both sides of the stairs and use them. Fix handrails that are broken or loose. Make sure that handrails are as long as the stairways.  Check any carpeting to make sure that it is firmly attached to the stairs. Fix any carpet that is loose or worn.  Avoid having throw rugs at the top or bottom of the stairs. If you do have throw   rugs, attach them to the floor with carpet tape.  Make sure that you have a light switch at the top of the stairs and the bottom of the stairs. If you do not have them, ask someone to add them for you. What else can I do to help prevent falls?  Wear shoes that: ? Do not have high heels. ? Have rubber bottoms. ? Are comfortable and fit you well. ? Are closed at the toe. Do not wear sandals.  If you use a stepladder: ? Make sure that it is fully opened. Do not climb a closed stepladder. ? Make sure that both sides of the stepladder are locked into place. ? Ask someone to hold it for you, if possible.  Clearly mark and make sure that you can see: ? Any grab bars or handrails. ? First and last steps. ? Where the edge of each step is.  Use tools that help you move around (mobility aids) if they are needed. These  include: ? Canes. ? Walkers. ? Scooters. ? Crutches.  Turn on the lights when you go into a dark area. Replace any light bulbs as soon as they burn out.  Set up your furniture so you have a clear path. Avoid moving your furniture around.  If any of your floors are uneven, fix them.  If there are any pets around you, be aware of where they are.  Review your medicines with your doctor. Some medicines can make you feel dizzy. This can increase your chance of falling. Ask your doctor what other things that you can do to help prevent falls. This information is not intended to replace advice given to you by your health care provider. Make sure you discuss any questions you have with your health care provider. Document Released: 07/12/2009 Document Revised: 02/21/2016 Document Reviewed: 10/20/2014 Elsevier Interactive Patient Education  2018 Elsevier Inc.   Health Maintenance, Male A healthy lifestyle and preventive care is important for your health and wellness. Ask your health care provider about what schedule of regular examinations is right for you. What should I know about weight and diet? Eat a Healthy Diet  Eat plenty of vegetables, fruits, whole grains, low-fat dairy products, and lean protein.  Do not eat a lot of foods high in solid fats, added sugars, or salt.  Maintain a Healthy Weight Regular exercise can help you achieve or maintain a healthy weight. You should:  Do at least 150 minutes of exercise each week. The exercise should increase your heart rate and make you sweat (moderate-intensity exercise).  Do strength-training exercises at least twice a week.  Watch Your Levels of Cholesterol and Blood Lipids  Have your blood tested for lipids and cholesterol every 5 years starting at 81 years of age. If you are at high risk for heart disease, you should start having your blood tested when you are 81 years old. You may need to have your cholesterol levels checked more  often if: ? Your lipid or cholesterol levels are high. ? You are older than 81 years of age. ? You are at high risk for heart disease.  What should I know about cancer screening? Many types of cancers can be detected early and may often be prevented. Lung Cancer  You should be screened every year for lung cancer if: ? You are a current smoker who has smoked for at least 30 years. ? You are a former smoker who has quit within the past 15 years.  Talk   to your health care provider about your screening options, when you should start screening, and how often you should be screened.  Colorectal Cancer  Routine colorectal cancer screening usually begins at 81 years of age and should be repeated every 5-10 years until you are 81 years old. You may need to be screened more often if early forms of precancerous polyps or small growths are found. Your health care provider may recommend screening at an earlier age if you have risk factors for colon cancer.  Your health care provider may recommend using home test kits to check for hidden blood in the stool.  A small camera at the end of a tube can be used to examine your colon (sigmoidoscopy or colonoscopy). This checks for the earliest forms of colorectal cancer.  Prostate and Testicular Cancer  Depending on your age and overall health, your health care provider may do certain tests to screen for prostate and testicular cancer.  Talk to your health care provider about any symptoms or concerns you have about testicular or prostate cancer.  Skin Cancer  Check your skin from head to toe regularly.  Tell your health care provider about any new moles or changes in moles, especially if: ? There is a change in a mole's size, shape, or color. ? You have a mole that is larger than a pencil eraser.  Always use sunscreen. Apply sunscreen liberally and repeat throughout the day.  Protect yourself by wearing long sleeves, pants, a wide-brimmed hat, and  sunglasses when outside.  What should I know about heart disease, diabetes, and high blood pressure?  If you are 18-39 years of age, have your blood pressure checked every 3-5 years. If you are 40 years of age or older, have your blood pressure checked every year. You should have your blood pressure measured twice-once when you are at a hospital or clinic, and once when you are not at a hospital or clinic. Record the average of the two measurements. To check your blood pressure when you are not at a hospital or clinic, you can use: ? An automated blood pressure machine at a pharmacy. ? A home blood pressure monitor.  Talk to your health care provider about your target blood pressure.  If you are between 45-79 years old, ask your health care provider if you should take aspirin to prevent heart disease.  Have regular diabetes screenings by checking your fasting blood sugar level. ? If you are at a normal weight and have a low risk for diabetes, have this test once every three years after the age of 45. ? If you are overweight and have a high risk for diabetes, consider being tested at a younger age or more often.  A one-time screening for abdominal aortic aneurysm (AAA) by ultrasound is recommended for men aged 65-75 years who are current or former smokers. What should I know about preventing infection? Hepatitis B If you have a higher risk for hepatitis B, you should be screened for this virus. Talk with your health care provider to find out if you are at risk for hepatitis B infection. Hepatitis C Blood testing is recommended for:  Everyone born from 1945 through 1965.  Anyone with known risk factors for hepatitis C.  Sexually Transmitted Diseases (STDs)  You should be screened each year for STDs including gonorrhea and chlamydia if: ? You are sexually active and are younger than 81 years of age. ? You are older than 81 years of age and   your health care provider tells you that you are  at risk for this type of infection. ? Your sexual activity has changed since you were last screened and you are at an increased risk for chlamydia or gonorrhea. Ask your health care provider if you are at risk.  Talk with your health care provider about whether you are at high risk of being infected with HIV. Your health care provider may recommend a prescription medicine to help prevent HIV infection.  What else can I do?  Schedule regular health, dental, and eye exams.  Stay current with your vaccines (immunizations).  Do not use any tobacco products, such as cigarettes, chewing tobacco, and e-cigarettes. If you need help quitting, ask your health care provider.  Limit alcohol intake to no more than 2 drinks per day. One drink equals 12 ounces of beer, 5 ounces of wine, or 1 ounces of hard liquor.  Do not use street drugs.  Do not share needles.  Ask your health care provider for help if you need support or information about quitting drugs.  Tell your health care provider if you often feel depressed.  Tell your health care provider if you have ever been abused or do not feel safe at home. This information is not intended to replace advice given to you by your health care provider. Make sure you discuss any questions you have with your health care provider. Document Released: 03/13/2008 Document Revised: 05/14/2016 Document Reviewed: 06/19/2015 Elsevier Interactive Patient Education  2018 Elsevier Inc.  

## 2017-04-21 NOTE — Telephone Encounter (Signed)
Noted thank you

## 2017-05-10 ENCOUNTER — Encounter: Payer: Self-pay | Admitting: Hematology and Oncology

## 2017-05-10 ENCOUNTER — Telehealth: Payer: Self-pay | Admitting: Hematology and Oncology

## 2017-05-10 NOTE — Telephone Encounter (Signed)
Appt has been scheduled for the pt to see Dr. Lebron Conners on 9/4 at 11am. Letter mailed to the pt.

## 2017-05-11 ENCOUNTER — Other Ambulatory Visit: Payer: Self-pay | Admitting: Adult Health

## 2017-05-11 DIAGNOSIS — F331 Major depressive disorder, recurrent, moderate: Secondary | ICD-10-CM

## 2017-05-11 DIAGNOSIS — R634 Abnormal weight loss: Secondary | ICD-10-CM

## 2017-05-11 DIAGNOSIS — G479 Sleep disorder, unspecified: Secondary | ICD-10-CM

## 2017-05-12 NOTE — Telephone Encounter (Signed)
Sent to the pharmacy by e-scribe for 6 months.  Last seen for this problem 10/31/16

## 2017-05-29 ENCOUNTER — Telehealth: Payer: Self-pay | Admitting: Hematology and Oncology

## 2017-05-29 NOTE — Telephone Encounter (Signed)
Left message for patient regarding upcoming September appointment updates.

## 2017-06-02 ENCOUNTER — Encounter: Payer: Medicare Other | Admitting: Hematology and Oncology

## 2017-06-11 ENCOUNTER — Encounter: Payer: Medicare Other | Admitting: Hematology and Oncology

## 2017-06-18 ENCOUNTER — Ambulatory Visit (HOSPITAL_BASED_OUTPATIENT_CLINIC_OR_DEPARTMENT_OTHER): Payer: Medicare Other

## 2017-06-18 ENCOUNTER — Encounter: Payer: Self-pay | Admitting: Hematology and Oncology

## 2017-06-18 ENCOUNTER — Ambulatory Visit (HOSPITAL_BASED_OUTPATIENT_CLINIC_OR_DEPARTMENT_OTHER): Payer: Medicare Other | Admitting: Hematology and Oncology

## 2017-06-18 VITALS — BP 159/87 | HR 92 | Temp 98.2°F | Resp 17 | Ht 65.0 in | Wt 130.3 lb

## 2017-06-18 DIAGNOSIS — I10 Essential (primary) hypertension: Secondary | ICD-10-CM | POA: Diagnosis not present

## 2017-06-18 DIAGNOSIS — D472 Monoclonal gammopathy: Secondary | ICD-10-CM

## 2017-06-18 LAB — COMPREHENSIVE METABOLIC PANEL
ALT: 12 U/L (ref 0–55)
AST: 15 U/L (ref 5–34)
Albumin: 3.7 g/dL (ref 3.5–5.0)
Alkaline Phosphatase: 45 U/L (ref 40–150)
Anion Gap: 9 mEq/L (ref 3–11)
BUN: 29.1 mg/dL — AB (ref 7.0–26.0)
CHLORIDE: 103 meq/L (ref 98–109)
CO2: 25 mEq/L (ref 22–29)
Calcium: 9.5 mg/dL (ref 8.4–10.4)
Creatinine: 1.5 mg/dL — ABNORMAL HIGH (ref 0.7–1.3)
EGFR: 42 mL/min/{1.73_m2} — ABNORMAL LOW (ref >90)
GLUCOSE: 98 mg/dL (ref 70–140)
POTASSIUM: 4 meq/L (ref 3.5–5.1)
SODIUM: 136 meq/L (ref 136–145)
Total Bilirubin: 0.82 mg/dL (ref 0.20–1.20)
Total Protein: 11.5 g/dL — ABNORMAL HIGH (ref 6.4–8.3)

## 2017-06-18 LAB — CBC WITH DIFFERENTIAL/PLATELET
BASO%: 0 % (ref 0.0–2.0)
BASOS ABS: 0 10*3/uL (ref 0.0–0.1)
EOS%: 0.5 % (ref 0.0–7.0)
Eosinophils Absolute: 0 10*3/uL (ref 0.0–0.5)
HEMATOCRIT: 33.3 % — AB (ref 38.4–49.9)
HEMOGLOBIN: 11.2 g/dL — AB (ref 13.0–17.1)
LYMPH#: 1.1 10*3/uL (ref 0.9–3.3)
LYMPH%: 29.8 % (ref 14.0–49.0)
MCH: 33.8 pg — AB (ref 27.2–33.4)
MCHC: 33.6 g/dL (ref 32.0–36.0)
MCV: 100.6 fL — ABNORMAL HIGH (ref 79.3–98.0)
MONO#: 0.3 10*3/uL (ref 0.1–0.9)
MONO%: 7.5 % (ref 0.0–14.0)
NEUT#: 2.3 10*3/uL (ref 1.5–6.5)
NEUT%: 62.2 % (ref 39.0–75.0)
NRBC: 0 % (ref 0–0)
Platelets: 128 10*3/uL — ABNORMAL LOW (ref 140–400)
RBC: 3.31 10*6/uL — AB (ref 4.20–5.82)
RDW: 15.5 % — ABNORMAL HIGH (ref 11.0–14.6)
WBC: 3.7 10*3/uL — ABNORMAL LOW (ref 4.0–10.3)

## 2017-06-18 LAB — TECHNOLOGIST REVIEW

## 2017-06-18 LAB — LACTATE DEHYDROGENASE: LDH: 127 U/L (ref 125–245)

## 2017-06-19 ENCOUNTER — Encounter: Payer: Self-pay | Admitting: Adult Health

## 2017-06-19 LAB — KAPPA/LAMBDA LIGHT CHAINS
IG KAPPA FREE LIGHT CHAIN: 4.4 mg/L (ref 3.3–19.4)
IG LAMBDA FREE LIGHT CHAIN: 171.3 mg/L — AB (ref 5.7–26.3)
Kappa/Lambda FluidC Ratio: 0.03 — ABNORMAL LOW (ref 0.26–1.65)

## 2017-06-19 LAB — BETA 2 MICROGLOBULIN, SERUM: BETA 2: 5.5 mg/L — AB (ref 0.6–2.4)

## 2017-06-22 LAB — MULTIPLE MYELOMA PANEL, SERUM
ALBUMIN/GLOB SERPL: 0.7 (ref 0.7–1.7)
ALPHA2 GLOB SERPL ELPH-MCNC: 0.7 g/dL (ref 0.4–1.0)
Albumin SerPl Elph-Mcnc: 4.3 g/dL (ref 2.9–4.4)
Alpha 1: 0.2 g/dL (ref 0.0–0.4)
B-Globulin SerPl Elph-Mcnc: 1 g/dL (ref 0.7–1.3)
Gamma Glob SerPl Elph-Mcnc: 4.5 g/dL — ABNORMAL HIGH (ref 0.4–1.8)
Globulin, Total: 6.4 g/dL — ABNORMAL HIGH (ref 2.2–3.9)
IGA/IMMUNOGLOBULIN A, SERUM: 6 mg/dL — AB (ref 61–437)
IGM (IMMUNOGLOBIN M), SRM: 7 mg/dL — AB (ref 15–143)
IgG, Qn, Serum: 6524 mg/dL — ABNORMAL HIGH (ref 700–1600)
M Protein SerPl Elph-Mcnc: 4.1 g/dL — ABNORMAL HIGH
Total Protein: 10.7 g/dL — ABNORMAL HIGH (ref 6.0–8.5)

## 2017-06-24 NOTE — Assessment & Plan Note (Signed)
83-year-old male with discovery of dysproteinemia with 3.9 mg/dL of monoclonal protein and peripheral blood. Next apparatus to evaluate for possible presence of multiple myeloma. Patient does have mild anemia, somewhat decreased renal function, no hypercalcemia, and early neuropathy.  Plan: --We will initiate lab work as outlined below and we will obtain skeletal survey to assess for possible lytic lesions in the skeletal structures. 

## 2017-06-24 NOTE — Progress Notes (Signed)
[facility] New Visit:  Assessment: MGUS (monoclonal gammopathy of unknown significance) 83 year old male with discovery of dysproteinemia with 3.9 mg/dL of monoclonal protein and peripheral blood. Next apparatus to evaluate for possible presence of multiple myeloma. Patient does have mild anemia, somewhat decreased renal function, no hypercalcemia, and early neuropathy.  Plan: --We will initiate lab work as outlined below and we will obtain skeletal survey to assess for possible lytic lesions in the skeletal structures.  --Return to clinic in 1 week to review results  Voice recognition software was used and creation of this note. Despite my best effort at editing the text, some misspelling/errors may have occurred.  Orders Placed This Encounter  Procedures  . DG Bone Survey Met    Standing Status:   Future    Standing Expiration Date:   08/18/2018    Order Specific Question:   Reason for Exam (SYMPTOM  OR DIAGNOSIS REQUIRED)    Answer:   MGUS, evaluating for possible Multiple myeloma    Order Specific Question:   Preferred imaging location?    Answer:   [facility]    Order Specific Question:   Radiology Contrast Protocol - do NOT remove file path    Answer:   [email or web address]  . CBC with Differential    Standing Status:   Future    Number of Occurrences:   1    Standing Expiration Date:   06/18/2018  . Comprehensive metabolic panel    Standing Status:   Future    Number of Occurrences:   1    Standing Expiration Date:   06/18/2018  . Lactate dehydrogenase (LDH)    Standing Status:   Future    Number of Occurrences:   1    Standing Expiration Date:   06/18/2018  . Beta 2 microglobulin    Standing Status:   Future    Number of Occurrences:   1    Standing Expiration Date:   06/18/2018  . Multiple Myeloma Panel (SPEP&IFE w/QIG)    Standing Status:   Future    Number of Occurrences:   1    Standing Expiration  Date:   06/18/2018  . Kappa/lambda light chains    Standing Status:   Future    Number of Occurrences:   1    Standing Expiration Date:   06/18/2018  . 24-Hr Ur UPEP/UIFE/Light Chains/TP    Standing Status:   Future    Number of Occurrences:   1    Standing Expiration Date:   06/18/2018    All questions were answered.  . The patient knows to call the clinic with any problems, questions or concerns.  This note was electronically signed.    History of Presenting Illness Peter Wolfe 83 y.o. presenting to the [facility] for Evaluation of asymptomatic old gammopathy of unknown significance. Patient was found with total protein number on routine lab work and was referred for our evaluation after for pheresis demonstrated presence of monoclonal spike. Overall, patient reports feeling well. He denies any fevers, chills, night sweats. As any skeletal pain, but acknowledges some weight loss losing about 15 pounds from his baseline over the past year. He does have good appetite and reports eating abundant ice cream, without nausea, vomiting, abdominal pain, early satiety, diarrhea, but with some history of constipation. He denies any chest pain, shortness of breath or cough. Denies any urinary symptoms. He does acknowledge that he has been having difficulties with numbness and  tingling in bilateral feet which is new for him. He has a history of years of [employer] service including working in [place] with possible [employer] exposure.  Oncological/hematological History: --Labs, 03/24/17: tProt 10.0, Alb 3.7, Ca 9.9, Cr 1.3, AP 42; SPEP -- M-Spike 3.9g/dL; WBC 4.4, Hgb 10.2, Plt 158  Medical History: Past Medical History:  Diagnosis Date  . BPH (benign prostatic hyperplasia)   . Broken rib   . COPD (chronic obstructive pulmonary disease) ([place])   . GERD (gastroesophageal reflux disease)   . GERD (gastroesophageal reflux disease)   . Hyperlipidemia   . Hypertension   . Insomnia   . Panic  attack     Surgical History: Past Surgical History:  Procedure Laterality Date  . CHOLECYSTECTOMY      Family History: Family History  Problem Relation Age of Onset  . Emphysema Father   . Alcohol abuse Father   . Diabetes Sister   . Alzheimer's disease Sister     Social History: Social History   Social History  . Marital status: Single    Spouse name: N/A  . Number of children: N/A  . Years of education: N/A   Occupational History  . Not on file.   Social History Main Topics  . Smoking status: Never Smoker  . Smokeless tobacco: Never Used  . Alcohol use 0.0 oz/week     Comment: occasional  . Drug use: No  . Sexual activity: Not on file   Other Topics Concern  . Not on file   Social History Narrative   Retired - Sgt, Major    Not married              Allergies: Allergies  Allergen Reactions  . Influenza Virus Vacc Split Pf Other (See Comments)    Allergic to eggs  . Doxycycline Rash  . Eggs Or Egg-Derived Products Rash    Medications:  Current Outpatient Prescriptions  Medication Sig Dispense Refill  . busPIRone (BUSPAR) 30 MG tablet TAKE 1 TABLET DAILY 100 tablet 3  . cloNIDine (CATAPRES) 0.1 MG tablet TAKE 1 TABLET DAILY 100 tablet 3  . FLUoxetine (PROZAC) 40 MG capsule TAKE ONE CAPSULE BY MOUTH ONCE DAILY 90 capsule 1  . mirtazapine (REMERON) 45 MG tablet TAKE ONE TABLET BY MOUTH AT BEDTIME 90 tablet 1  . Multiple Vitamin (MULTIVITAMIN) capsule Take 1 capsule by mouth daily.    Marland Kitchen omeprazole (PRILOSEC) 40 MG capsule Take 1 capsule (40 mg total) by mouth daily.    . risedronate (ACTONEL) 150 MG tablet Take 1 tablet (150 mg total) by mouth every 30 (thirty) days. with water on empty stomach, nothing by mouth or lie down for next 30 minutes. 1 tablet 3  . simvastatin (ZOCOR) 10 MG tablet TAKE 1 TABLET AT BEDTIME 100 tablet 3  . tamsulosin (FLOMAX) 0.4 MG CAPS capsule TAKE 1 CAPSULE DAILY 100 capsule 3  . traZODone (DESYREL) 50 MG tablet Take 50 mg  by mouth at bedtime.     No current facility-administered medications for this visit.     Review of Systems: Review of Systems  Constitutional: Positive for unexpected weight change. Negative for appetite change, chills, diaphoresis, fatigue and fever.  HENT:  Negative.   Eyes: Negative.   Respiratory: Negative.   Cardiovascular: Negative.   Gastrointestinal: Negative.   Endocrine: Negative.   Genitourinary: Negative.    Musculoskeletal: Negative.  Negative for gait problem.  Skin: Negative.   Neurological: Positive for numbness. Negative for dizziness, extremity weakness,  gait problem, headaches, light-headedness, seizures and speech difficulty.  Hematological: Negative.   Psychiatric/Behavioral: Negative.      PHYSICAL EXAMINATION Blood pressure (!) 159/87, pulse 92, temperature 98.2 F (36.8 C), temperature source Oral, resp. rate 17, height 5' 5"  (1.651 m), weight 130 lb 4.8 oz (59.1 kg), SpO2 98 %.  ECOG PERFORMANCE STATUS: 1 - Symptomatic but completely ambulatory  Physical Exam  Constitutional: He is oriented to person, place, and time and well-developed, well-nourished, and in no distress.  HENT:  Head: Normocephalic and atraumatic.  Mouth/Throat: Oropharynx is clear and moist. No oropharyngeal exudate.  Eyes: Pupils are equal, round, and reactive to light. No scleral icterus.  Neck: Normal range of motion. No thyromegaly present.  Cardiovascular: Normal rate, regular rhythm and normal heart sounds.   No murmur heard. Pulmonary/Chest: Effort normal and breath sounds normal. No respiratory distress. He has no wheezes. He has no rales.  Abdominal: Soft. Bowel sounds are normal. He exhibits no distension and no mass. There is no tenderness. There is no rebound.  Musculoskeletal: Normal range of motion. He exhibits no edema.  Lymphadenopathy:    He has no cervical adenopathy.  Neurological: He is alert and oriented to person, place, and time. He has normal reflexes. No  cranial nerve deficit.  Skin: Skin is warm and dry. No rash noted. He is not diaphoretic. No erythema.     LABORATORY DATA: I have personally reviewed the data as listed: Appointment on 06/18/2017  Component Date Value Ref Range Status  . WBC 06/18/2017 3.7* 4.0 - 10.3 10e3/uL Final  . NEUT# 06/18/2017 2.3  1.5 - 6.5 10e3/uL Final  . HGB 06/18/2017 11.2* 13.0 - 17.1 g/dL Final  . HCT 06/18/2017 33.3* 38.4 - 49.9 % Final  . Platelets 06/18/2017 128* 140 - 400 10e3/uL Final  . MCV 06/18/2017 100.6* 79.3 - 98.0 fL Final  . MCH 06/18/2017 33.8* 27.2 - 33.4 pg Final  . MCHC 06/18/2017 33.6  32.0 - 36.0 g/dL Final  . RBC 06/18/2017 3.31* 4.20 - 5.82 10e6/uL Final  . RDW 06/18/2017 15.5* 11.0 - 14.6 % Final  . lymph# 06/18/2017 1.1  0.9 - 3.3 10e3/uL Final  . MONO# 06/18/2017 0.3  0.1 - 0.9 10e3/uL Final  . Eosinophils Absolute 06/18/2017 0.0  0.0 - 0.5 10e3/uL Final  . Basophils Absolute 06/18/2017 0.0  0.0 - 0.1 10e3/uL Final  . NEUT% 06/18/2017 62.2  39.0 - 75.0 % Final  . LYMPH% 06/18/2017 29.8  14.0 - 49.0 % Final  . MONO% 06/18/2017 7.5  0.0 - 14.0 % Final  . EOS% 06/18/2017 0.5  0.0 - 7.0 % Final  . BASO% 06/18/2017 0.0  0.0 - 2.0 % Final  . nRBC 06/18/2017 0  0 - 0 % Final  . Sodium 06/18/2017 136  136 - 145 mEq/L Final  . Potassium 06/18/2017 4.0  3.5 - 5.1 mEq/L Final  . Chloride 06/18/2017 103  98 - 109 mEq/L Final  . CO2 06/18/2017 25  22 - 29 mEq/L Final  . Glucose 06/18/2017 98  70 - 140 mg/dl Final   Glucose reference range is for nonfasting patients. Fasting glucose reference range is 70- 100.  Marland Kitchen BUN 06/18/2017 29.1* 7.0 - 26.0 mg/dL Final  . Creatinine 06/18/2017 1.5* 0.7 - 1.3 mg/dL Final  . Total Bilirubin 06/18/2017 0.82  0.20 - 1.20 mg/dL Final  . Alkaline Phosphatase 06/18/2017 45  40 - 150 U/L Final  . AST 06/18/2017 15  5 - 34 U/L Final  .  ALT 06/18/2017 12  0 - 55 U/L Final  . Total Protein 06/18/2017 11.5* 6.4 - 8.3 g/dL Final  . Albumin 06/18/2017 3.7  3.5  - 5.0 g/dL Final  . Calcium 06/18/2017 9.5  8.4 - 10.4 mg/dL Final  . Anion Gap 06/18/2017 9  3 - 11 mEq/L Final  . EGFR 06/18/2017 42* >90 ml/min/1.73 m2 Final   eGFR is calculated using the CKD-EPI Creatinine Equation (2009)  . LDH 06/18/2017 127  125 - 245 U/L Final  . Beta-2 06/18/2017 5.5* 0.6 - 2.4 mg/L Final   Siemens Immulite 2000 Immunochemiluminometric assay (ICMA)  . IgG, Qn, Serum 06/18/2017 6,524* 700 - 1600 mg/dL Final   Comment: Results confirmed on dilution.   . IgA, Qn, Serum 06/18/2017 6* 61 - 437 mg/dL Final   Result confirmed on concentration.  . IgM, Qn, Serum 06/18/2017 7* 15 - 143 mg/dL Final   Result confirmed on concentration.  . Total Protein 06/18/2017 10.7* 6.0 - 8.5 g/dL Final   **Verified by repeat analysis**  . Albumin SerPl Elph-Mcnc 06/18/2017 4.3  2.9 - 4.4 g/dL Final  . Alpha 1 06/18/2017 0.2  0.0 - 0.4 g/dL Final  . Alpha2 Glob SerPl Elph-Mcnc 06/18/2017 0.7  0.4 - 1.0 g/dL Final  . B-Globulin SerPl Elph-Mcnc 06/18/2017 1.0  0.7 - 1.3 g/dL Final  . Gamma Glob SerPl Elph-Mcnc 06/18/2017 4.5* 0.4 - 1.8 g/dL Final  . M Protein SerPl Elph-Mcnc 06/18/2017 4.1* Not Observed g/dL Final  . Globulin, Total 06/18/2017 6.4* 2.2 - 3.9 g/dL Final  . Albumin/Glob SerPl 06/18/2017 0.7  0.7 - 1.7 Final  . IFE 1 06/18/2017 Comment   Final   Comment: Immunofixation shows IgG monoclonal protein with lambda light chain specificity.   . Please Note 06/18/2017 Comment   Final   Comment: Protein electrophoresis scan will follow via computer, mail, or courier delivery.   Loletha Grayer Kappa Free Light Chain 06/18/2017 4.4  3.3 - 19.4 mg/L Final  . Ig Lambda Free Light Chain 06/18/2017 171.3* 5.7 - 26.3 mg/L Final  . Kappa/Lambda FluidC Ratio 06/18/2017 0.03* 0.26 - 1.65 Final  . Technologist Review 06/18/2017 Few plasmacytoid lymphs, rare metamyelocyte, rouleaux present    Final      Ardath Sax, MD

## 2017-06-25 ENCOUNTER — Telehealth: Payer: Self-pay | Admitting: Hematology and Oncology

## 2017-06-25 ENCOUNTER — Ambulatory Visit (HOSPITAL_BASED_OUTPATIENT_CLINIC_OR_DEPARTMENT_OTHER): Payer: Medicare Other

## 2017-06-25 ENCOUNTER — Ambulatory Visit (HOSPITAL_BASED_OUTPATIENT_CLINIC_OR_DEPARTMENT_OTHER): Payer: Medicare Other | Admitting: Hematology and Oncology

## 2017-06-25 ENCOUNTER — Encounter: Payer: Self-pay | Admitting: Hematology and Oncology

## 2017-06-25 VITALS — BP 129/75 | HR 100 | Temp 98.4°F | Resp 18 | Ht 65.0 in | Wt 130.3 lb

## 2017-06-25 DIAGNOSIS — C9000 Multiple myeloma not having achieved remission: Secondary | ICD-10-CM

## 2017-06-25 DIAGNOSIS — D472 Monoclonal gammopathy: Secondary | ICD-10-CM

## 2017-06-25 MED ORDER — LENALIDOMIDE 10 MG PO CAPS: 10.0000 mg | ORAL_CAPSULE | Freq: Every day | ORAL | 0 refills | Status: DC

## 2017-06-25 NOTE — Telephone Encounter (Signed)
Scheduled appt per 9/27 los - Gave patient AVS and calender per los.  

## 2017-06-25 NOTE — Progress Notes (Signed)
Received referral call from [facility]) of patient with concerns about paying for treatment.   Reviewed chart and noticed all oral medications under treatment plan. Patient has dual coverage. Advised her I would not be able to meet with patient today.  Went to ask RN if patient received any prescriptions today and then asked the doctor. Per doctor nothing new given today.  Went to introduce myself to patient while he was waiting to be called for scheduling. Asked patient if he had concerns with medications. He states he receives oral medications now through the mail but understands new medications will be more expensive. Advised patient that Raquel Sarna in pharmacy will reach out to his insurance for authorization of oral treament if needed and if he has to have any imaging procedures, there is someone whom will do that authorization as well. Advised that I don't have the cost of what it may cost to him, but gave him my card for any additional financial questions or concerns.   Advised him also that he may apply for a one-time $400 grant that may help with medication and other personal expenses but proof of his income would be required. Advised he may call and set up an appointment to bring that in and apply for the grant if he feels he needs to once he receives those prescriptions. He verbalized understanding.

## 2017-06-26 ENCOUNTER — Other Ambulatory Visit: Payer: Self-pay

## 2017-06-26 ENCOUNTER — Telehealth: Payer: Self-pay | Admitting: *Deleted

## 2017-06-26 ENCOUNTER — Telehealth: Payer: Self-pay

## 2017-06-26 DIAGNOSIS — C9000 Multiple myeloma not having achieved remission: Secondary | ICD-10-CM

## 2017-06-26 LAB — UPEP/UIFE/LIGHT CHAINS/TP, 24-HR UR
% BETA, Urine: 13.4 %
ALBUMIN, U: 13.9 %
ALPHA 1 URINE: 5.9 %
ALPHA-2-GLOBULIN, U: 17.2 %
FREE LAMBDA LT CHAINS, UR: 136 mg/L — AB (ref 0.24–6.66)
Free Kappa Lt Chains,Ur: 13.8 mg/L (ref 1.35–24.19)
GAMMA GLOBULIN URINE: 49.5 %
KAPPA/LAMBDA RATIO, U: 0.1 — AB (ref 2.04–10.37)
M-SPIKE, %: 27.9 % — ABNORMAL HIGH
M-Spike, mg/24 hr: 38 mg/24 hr — ABNORMAL HIGH
PROTEIN UR: 11.4 mg/dL
Prot,24hr calculated: 137 mg/24 hr (ref 30–150)

## 2017-06-26 LAB — VISCOSITY, SERUM: VISCOSITY, SERUM: 2.3 rel.saline — AB (ref 1.6–1.9)

## 2017-06-26 MED ORDER — LENALIDOMIDE 10 MG PO CAPS: 10.0000 mg | ORAL_CAPSULE | Freq: Every day | ORAL | 0 refills | Status: DC

## 2017-06-26 NOTE — Telephone Encounter (Signed)
Attempt to contact pt this evening. Mailbox full and unable to leave a message. Notified by radiology that pt is not willing to schedule port placement at this time because family does not want him to proceed with treatment right now. Per Dr. Lebron Conners yesterday post visit, pt to be scheduled for bone marrow biopsy, port placement, PET scan, dental referral (to approve or deny use of zometa), and begin processing revlimid (oral chemotherapy). Unaware at this time of who pt family is that did not want him to proceed with treatment and procedures.   Spoke with pt niece who called today and communicated plan relayed to me from the physician yesterday. She said that she has been unable to get in touch with her uncle today, as was I. Mailbox was full and no message could be sent. She voiced concern because her uncle said that he was only going to be receiving radiation. Niece said that she will f/u with her uncle to determine what is going on.  Additionally, staff message sent to Margaretmary Eddy, RN to f/u with Dr. Lebron Conners and pt on Monday to determine plan of treatment. As expressed to the pt's niece, any treatment or proceed is voluntary, and if the pt does not wish to proceed in any way he needs to communicate with [place].

## 2017-06-26 NOTE — Telephone Encounter (Signed)
FYI "This is Peter Wolfe [phone]) calling regarding my uncle Peter Wolfe.  He just received a diagnosis of cancer.  He is in [place].  I am in [place].  I'm his only point of contact.  He couldn't explain all the information so I am hoping to receive a call from the doctor or nurse with update on his cancer and treatment.  Please call."  Routing call information to collaborative nurse and provider for review.  Further patient communication through collaborative nurse.

## 2017-06-29 ENCOUNTER — Telehealth: Payer: Self-pay | Admitting: *Deleted

## 2017-06-29 NOTE — Telephone Encounter (Signed)
Called pt to clarify staff message from Carolanne Grumbling, [place].  Pt stated he is unsure if he wants to proceed with treatment at this time.  "I spoke with some of my friends who have gone through treatment, and they said it was very harsh.  I don't think I want to go through all of that.  I'm not afraid of dying, I'm ready to meet the lord."  This RN offered for pt to come in sooner than 10/11 apt to discuss options with Dr. Lebron Conners.  Pt stated he wanted to keep apts as currently scheduled.  Pt stated he would call if he has changed his mind about anything.  Pt thankful for call.

## 2017-07-01 ENCOUNTER — Ambulatory Visit (HOSPITAL_COMMUNITY): Payer: Medicare Other

## 2017-07-01 NOTE — Progress Notes (Signed)
Peter Wolfe:  Assessment: Multiple myeloma not having achieved remission [facility]) 83 year old male with initial presentation of dysproteinemia with 3.9 g/dL of monoclonal protein in peripheral blood. On presentation, patient did have mild anemia, somewhat decreased renal function, no hypercalcemia, and early neuropathy.we conducted additional assessment demonstrating rising levels of clinical protein, protein was identified as IgG lambda. Patient has depressed levels of IgA and IgM with high levels of IgG exceeding 6 g/dL. Kappa to lambda ratio is 0.03, approaching diagnostic threshold for multiple myeloma by itself. Our suspicion for multiple myeloma is much more likely at this point in time. Due to high level of their protein, faster assessment and more involved assessment or indicated  Plan: --discontinue skeletal survey and replace with PET/CT --schedule bone marrow biopsyfor tissue diagnosis and risk stratification. --check serum viscosity -- due to advanced age, triple therapy for multiple myeloma would be too intense for the patient. In this situation I would consider treating the patient with combination of lenalidomide and dexamethasone --consult dentistry for assessment for possible need to use zoledronic acid -- consult social work to evaluate for possible sources of financial assistance with cost of evaluation and care  --return to clinic in two weeks to discuss results of final stages of assessment and to discuss the therapy choices  Voice recognition software was used and creation of this note. Despite my best effort at editing the text, some misspelling/errors may have occurred.   Orders Placed This Encounter  Procedures  . NM PET Image Initial (PI) Whole Body    Standing Status:   Future    Standing Expiration Date:   06/25/2018    Order Specific Question:   If indicated for the ordered procedure, I authorize the administration of a  radiopharmaceutical per Radiology protocol    Answer:   Yes    Order Specific Question:   Preferred imaging location?    Answer:   [facility]    Order Specific Question:   Radiology Contrast Protocol - do NOT remove file path    Answer:   \\[email or web address]    Order Specific Question:   Reason for Exam additional comments    Answer:   Multiple myeloma, please eval possible lesion locations and fracture threats  . CT BIOPSY    Standing Status:   Future    Standing Expiration Date:   09/24/2018    Order Specific Question:   Lab orders requested (DO NOT place separate lab orders, these will be automatically ordered during procedure specimen collection):    Answer:   Cytology - Non Pap    Comments:   Flow cyto, cytogenetics, Myeloma FISH    Order Specific Question:   Lab orders requested (DO NOT place separate lab orders, these will be automatically ordered during procedure specimen collection):    Answer:   Surgical Pathology    Order Specific Question:   Lab orders requested (DO NOT place separate lab orders, these will be automatically ordered during procedure specimen collection):    Answer:   Other    Order Specific Question:   Reason for Exam (SYMPTOM  OR DIAGNOSIS REQUIRED)    Answer:   Multiple myeloma evaluation    Order Specific Question:   Preferred imaging location?    Answer:   [facility]    Order Specific Question:   Radiology Contrast Protocol - do NOT remove file path    Answer:   \\[email or web address]\CTProtocols.pdf  . IR FLUORO GUIDE PORT INSERTION RIGHT  Standing Status:   Future    Standing Expiration Date:   08/25/2018    Order Specific Question:   Reason for Exam (SYMPTOM  OR DIAGNOSIS REQUIRED)    Answer:   Multiple myeloma -- will need good access for regular labs and therapy    Order Specific Question:   Preferred Imaging Location?    Answer:   [facility]  . CT BONE MARROW BIOPSY & ASPIRATION     Standing Status:   Future    Standing Expiration Date:   09/24/2018    Order Specific Question:   Reason for Exam (SYMPTOM  OR DIAGNOSIS REQUIRED)    Answer:   Multiple myeloma    Order Specific Question:   Preferred imaging location?    Answer:   [facility]    Order Specific Question:   Radiology Contrast Protocol - do NOT remove file path    Answer:   \\[email or web address]\CTProtocols.pdf  . Viscosity, serum    Standing Status:   Future    Number of Occurrences:   1    Standing Expiration Date:   06/25/2018  . CBC with Differential    Standing Status:   Future    Standing Expiration Date:   06/25/2018  . Comprehensive metabolic panel    Standing Status:   Future    Standing Expiration Date:   06/25/2018  . Uric acid    Standing Status:   Future    Standing Expiration Date:   06/25/2018  . Lactate dehydrogenase (LDH)    Standing Status:   Future    Standing Expiration Date:   06/25/2018  . Beta 2 microglobulin, serum    Standing Status:   Future    Standing Expiration Date:   06/25/2018  . IgG    Standing Status:   Future    Standing Expiration Date:   06/25/2018  . Viscosity, serum    Standing Status:   Future    Standing Expiration Date:   06/25/2018  . Ambulatory referral to Dentistry    Referral Priority:   Routine    Referral Type:   Consultation    Referral Reason:   Specialty Services Required    Requested Specialty:   Dental General Practice    Number of Visits Requested:   1    Cancer Staging No matching staging information was found for the patient.  All questions were answered.  . The patient knows to call the clinic with any problems, questions or concerns.  This note was electronically signed.    History of Presenting Illness Peter Wolfe is an 83 y.o. male followed in the [place] for multiple myeloma. Patient was found with total protein number on routine lab work and was referred for our evaluation after for pheresis  demonstrated presence of monoclonal spike. Overall, patient reports feeling well. He denies any fevers, chills, night sweats. As any skeletal pain, but acknowledges some weight loss losing about 15 pounds from his baseline over the past year. He does have good appetite and reports eating abundant ice cream, without nausea, vomiting, abdominal pain, early satiety, diarrhea, but with some history of constipation. He denies any chest pain, shortness of breath or cough. Denies any urinary symptoms. He does acknowledge that he has been having difficulties with numbness and tingling in bilateral feet which is new for him. He has a history of years of [employer] service including working in [place] with possible [employer] exposure.  Since last Wolfe to  the clinic, continues to complain of constipation, using suppositories. Also complains of generalized pruritus.  Oncological/hematological History: --Labs, 03/24/17: tProt 10.0, Alb 3.7, Ca 9.9, Cr 1.3, AP 42; SPEP -- M-Spike 3.9g/dL; WBC 4.4, Hgb 10.2, Plt 158 --Labs, 06/18/17: tProt 11.5, Alb 3.7, Ca 9.5, Cr 1.5, AP 45; SPEP -- M-spike 4.1g/dL, IgG lambda by SIFE; IgG 6524, IgA 6, IgM 7; kappa 4.4, lambda 171, KLR 0.03; LDH 127; WBC 3.7, Hgb 11.2, Plt 128; Viscosity 2.3; UPEP -- 49m/24hrs  Oncological/hematological History:  No history exists.    Medical History: Past Medical History:  Diagnosis Date  . BPH (benign prostatic hyperplasia)   . Broken rib   . COPD (chronic obstructive pulmonary disease) ([place]   . GERD (gastroesophageal reflux disease)   . GERD (gastroesophageal reflux disease)   . Hyperlipidemia   . Hypertension   . Insomnia   . Panic attack     Surgical History: Past Surgical History:  Procedure Laterality Date  . CHOLECYSTECTOMY      Family History: Family History  Problem Relation Age of Onset  . Emphysema Father   . Alcohol abuse Father   . Diabetes Sister   . Alzheimer's disease Sister     Social History: Social  History   Social History  . Marital status: Single    Spouse name: N/A  . Number of children: N/A  . Years of education: N/A   Occupational History  . Not on file.   Social History Main Topics  . Smoking status: Never Smoker  . Smokeless tobacco: Never Used  . Alcohol use 0.0 oz/week     Comment: occasional  . Drug use: No  . Sexual activity: Not on file   Other Topics Concern  . Not on file   Social History Narrative   Retired - Sgt, Major    Not married              Allergies: Allergies  Allergen Reactions  . Influenza Virus Vacc Split Pf Other (See Comments)    Allergic to eggs  . Doxycycline Rash  . Eggs Or Egg-Derived Products Rash    Medications:  Current Outpatient Prescriptions  Medication Sig Dispense Refill  . busPIRone (BUSPAR) 30 MG tablet TAKE 1 TABLET DAILY 100 tablet 3  . cloNIDine (CATAPRES) 0.1 MG tablet TAKE 1 TABLET DAILY 100 tablet 3  . FLUoxetine (PROZAC) 40 MG capsule TAKE ONE CAPSULE BY MOUTH ONCE DAILY 90 capsule 1  . lenalidomide (REVLIMID) 10 MG capsule Take 1 capsule (10 mg total) by mouth daily. Take 1 capsule daily on days 1-21 every 28 days. Auth# [id number] 21 capsule 0  . mirtazapine (REMERON) 45 MG tablet TAKE ONE TABLET BY MOUTH AT BEDTIME 90 tablet 1  . Multiple Vitamin (MULTIVITAMIN) capsule Take 1 capsule by mouth daily.    .Marland Kitchenomeprazole (PRILOSEC) 40 MG capsule Take 1 capsule (40 mg total) by mouth daily.    . risedronate (ACTONEL) 150 MG tablet Take 1 tablet (150 mg total) by mouth every 30 (thirty) days. with water on empty stomach, nothing by mouth or lie down for next 30 minutes. 1 tablet 3  . simvastatin (ZOCOR) 10 MG tablet TAKE 1 TABLET AT BEDTIME 100 tablet 3  . tamsulosin (FLOMAX) 0.4 MG CAPS capsule TAKE 1 CAPSULE DAILY 100 capsule 3  . traZODone (DESYREL) 50 MG tablet Take 50 mg by mouth at bedtime.     No current facility-administered medications for this Wolfe.     Review of  Systems: Review of Systems   Constitutional: Positive for unexpected weight change. Negative for appetite change, chills, diaphoresis, fatigue and fever.  HENT:  Negative.   Eyes: Negative.   Respiratory: Negative.   Cardiovascular: Negative.   Gastrointestinal: Positive for constipation.  Endocrine: Negative.   Genitourinary: Negative.    Musculoskeletal: Negative.  Negative for gait problem.  Skin: Positive for itching.  Neurological: Positive for numbness. Negative for dizziness, extremity weakness, gait problem, headaches, light-headedness, seizures and speech difficulty.  Hematological: Negative.   Psychiatric/Behavioral: Negative.      PHYSICAL EXAMINATION Blood pressure 129/75, pulse 100, temperature 98.4 F (36.9 C), temperature source Oral, resp. rate 18, height _0  (1.651 m), weight 130 lb 4.8 oz (59.1 kg), SpO2 94 %.  ECOG PERFORMANCE STATUS: 2 - Symptomatic, <50% confined to bed  Physical Exam  Constitutional: He is oriented to person, place, and time and well-developed, well-nourished, and in no distress.  HENT:  Head: Normocephalic and atraumatic.  Mouth/Throat: Oropharynx is clear and moist. No oropharyngeal exudate.  Eyes: Pupils are equal, round, and reactive to light. No scleral icterus.  Neck: Normal range of motion. No thyromegaly present.  Cardiovascular: Normal rate, regular rhythm and normal heart sounds.   No murmur heard. Pulmonary/Chest: Effort normal and breath sounds normal. No respiratory distress. He has no wheezes. He has no rales.  Abdominal: Soft. Bowel sounds are normal. He exhibits no distension and no mass. There is no tenderness. There is no rebound.  Musculoskeletal: Normal range of motion. He exhibits no edema.  Lymphadenopathy:    He has no cervical adenopathy.  Neurological: He is alert and oriented to person, place, and time. He has normal reflexes. No cranial nerve deficit.  Skin: Skin is warm and dry. No rash noted. He is not diaphoretic. No erythema.      LABORATORY DATA: I have personally reviewed the data as listed: Appointment on 06/25/2017  Component Date Value Ref Range Status  . Viscosity, Serum 06/25/2017 2.3* 1.6 - 1.9 rel.saline Final   Comment: Values above 2.7 may indicate paraproteinemia is present. This test was developed and its performance characteristics determined by [facility]. It has not been cleared or approved by the Food and Drug Administration.        Ardath Sax, MD

## 2017-07-01 NOTE — Assessment & Plan Note (Signed)
83 year old male with initial presentation of dysproteinemia with 3.9 g/dL of monoclonal protein in peripheral blood. On presentation, patient did have mild anemia, somewhat decreased renal function, no hypercalcemia, and early neuropathy.we conducted additional assessment demonstrating rising levels of clinical protein, protein was identified as IgG lambda. Patient has depressed levels of IgA and IgM with high levels of IgG exceeding 6 g/dL. Kappa to lambda ratio is 0.03, approaching diagnostic threshold for multiple myeloma by itself. Our suspicion for multiple myeloma is much more likely at this point in time. Due to high level of their protein, faster assessment and more involved assessment or indicated  Plan: --discontinue skeletal survey and replace with PET/CT --schedule bone marrow biopsyfor tissue diagnosis and risk stratification. --check serum viscosity -- due to advanced age, triple therapy for multiple myeloma would be too intense for the patient. In this situation I would consider treating the patient with combination of lenalidomide and dexamethasone --consult dentistry for assessment for possible need to use zoledronic acid -- consult social work to evaluate for possible sources of financial assistance with cost of evaluation and care

## 2017-07-09 ENCOUNTER — Encounter: Payer: Self-pay | Admitting: Hematology and Oncology

## 2017-07-09 ENCOUNTER — Ambulatory Visit (HOSPITAL_BASED_OUTPATIENT_CLINIC_OR_DEPARTMENT_OTHER): Payer: Medicare Other | Admitting: Hematology and Oncology

## 2017-07-09 ENCOUNTER — Other Ambulatory Visit (HOSPITAL_BASED_OUTPATIENT_CLINIC_OR_DEPARTMENT_OTHER): Payer: Medicare Other

## 2017-07-09 ENCOUNTER — Telehealth: Payer: Self-pay | Admitting: Hematology and Oncology

## 2017-07-09 VITALS — BP 159/85 | HR 88 | Temp 98.7°F | Resp 18 | Ht 65.0 in | Wt 132.8 lb

## 2017-07-09 DIAGNOSIS — C9000 Multiple myeloma not having achieved remission: Secondary | ICD-10-CM

## 2017-07-09 LAB — CBC WITH DIFFERENTIAL/PLATELET
BASO%: 0.2 % (ref 0.0–2.0)
BASOS ABS: 0 10*3/uL (ref 0.0–0.1)
EOS%: 1 % (ref 0.0–7.0)
Eosinophils Absolute: 0 10*3/uL (ref 0.0–0.5)
HCT: 29.3 % — ABNORMAL LOW (ref 38.4–49.9)
HEMOGLOBIN: 10 g/dL — AB (ref 13.0–17.1)
LYMPH%: 36.7 % (ref 14.0–49.0)
MCH: 34.3 pg — AB (ref 27.2–33.4)
MCHC: 34.2 g/dL (ref 32.0–36.0)
MCV: 100.2 fL — ABNORMAL HIGH (ref 79.3–98.0)
MONO#: 0.4 10*3/uL (ref 0.1–0.9)
MONO%: 14.5 % — AB (ref 0.0–14.0)
NEUT#: 1.3 10*3/uL — ABNORMAL LOW (ref 1.5–6.5)
NEUT%: 47.6 % (ref 39.0–75.0)
Platelets: 123 10*3/uL — ABNORMAL LOW (ref 140–400)
RBC: 2.92 10*6/uL — ABNORMAL LOW (ref 4.20–5.82)
RDW: 15.4 % — AB (ref 11.0–14.6)
WBC: 2.7 10*3/uL — AB (ref 4.0–10.3)
lymph#: 1 10*3/uL (ref 0.9–3.3)

## 2017-07-09 LAB — COMPREHENSIVE METABOLIC PANEL
ALBUMIN: 3.3 g/dL — AB (ref 3.5–5.0)
ALK PHOS: 52 U/L (ref 40–150)
ALT: 14 U/L (ref 0–55)
AST: 14 U/L (ref 5–34)
Anion Gap: 9 mEq/L (ref 3–11)
BUN: 21.5 mg/dL (ref 7.0–26.0)
CHLORIDE: 101 meq/L (ref 98–109)
CO2: 26 mEq/L (ref 22–29)
Calcium: 9.1 mg/dL (ref 8.4–10.4)
Creatinine: 1.6 mg/dL — ABNORMAL HIGH (ref 0.7–1.3)
EGFR: 40 mL/min/{1.73_m2} — ABNORMAL LOW (ref >60)
GLUCOSE: 117 mg/dL (ref 70–140)
POTASSIUM: 4.1 meq/L (ref 3.5–5.1)
SODIUM: 136 meq/L (ref 136–145)
Total Bilirubin: 0.57 mg/dL (ref 0.20–1.20)
Total Protein: 10.3 g/dL — ABNORMAL HIGH (ref 6.4–8.3)

## 2017-07-09 LAB — LACTATE DEHYDROGENASE: LDH: 132 U/L (ref 125–245)

## 2017-07-09 LAB — URIC ACID: Uric Acid, Serum: 7.1 mg/dl (ref 2.6–7.4)

## 2017-07-09 NOTE — Telephone Encounter (Signed)
Patient was not able to speak with social worker, Dan Maker advised to hold off scheduling 10/11 los so that he can get his scans

## 2017-07-10 ENCOUNTER — Encounter: Payer: Self-pay | Admitting: *Deleted

## 2017-07-10 LAB — VISCOSITY, SERUM: VISCOSITY, SERUM: 2.3 rel.saline — AB (ref 1.6–1.9)

## 2017-07-10 LAB — IGG: IgG, Qn, Serum: 5526 mg/dL — ABNORMAL HIGH (ref 700–1600)

## 2017-07-10 LAB — BETA 2 MICROGLOBULIN, SERUM: Beta-2: 6.7 mg/L — ABNORMAL HIGH (ref 0.6–2.4)

## 2017-07-10 NOTE — Progress Notes (Signed)
Peter Wolfe  Clinical Social Wolfe was referred by medical oncology MD for assessment of psychosocial needs; specifically home care needs.  Clinical Social Worker contacted patient's niece, Peter Wolfe, listed as primary contact to offer support and assess for needs.  Peter Wolfe reported Dr. Lebron Conners did not recommend for patient to live alone.  Peter Wolfe currently lives alone and has no family in the area- patient's nieces live in [place].  Peter Wolfe had questions regarding VA benefits- CSW provided information on [place] [facility] and [place] and encouraged them to call to establish home care benefits and determine if he would qualify for ALF/SNF placement through the [place].  CSW is not aware of a VA ALF or SNF in the [place] area.  Patient's niece plans to follow up with CSW next week or as they have more questions.   Maryjean Morn, MSW, LCSW, OSW-C Clinical Social Worker [facility] [phone]

## 2017-07-13 ENCOUNTER — Telehealth: Payer: Self-pay | Admitting: Hematology and Oncology

## 2017-07-13 ENCOUNTER — Other Ambulatory Visit: Payer: Self-pay

## 2017-07-13 ENCOUNTER — Other Ambulatory Visit: Payer: Self-pay | Admitting: Pharmacist

## 2017-07-13 MED ORDER — PROCHLORPERAZINE MALEATE 10 MG PO TABS: 10.0000 mg | ORAL_TABLET | Freq: Four times a day (QID) | ORAL | 1 refills | Status: DC | PRN

## 2017-07-13 MED ORDER — LIDOCAINE-PRILOCAINE 2.5-2.5 % EX CREA: TOPICAL_CREAM | CUTANEOUS | 3 refills | Status: DC

## 2017-07-13 MED ORDER — DEXAMETHASONE 4 MG PO TABS: ORAL_TABLET | ORAL | 3 refills | Status: DC

## 2017-07-13 MED ORDER — ACYCLOVIR 400 MG PO TABS: 400.0000 mg | ORAL_TABLET | Freq: Two times a day (BID) | ORAL | 3 refills | Status: DC

## 2017-07-13 MED ORDER — LORAZEPAM 0.5 MG PO TABS: 0.5000 mg | ORAL_TABLET | Freq: Four times a day (QID) | ORAL | 0 refills | Status: DC | PRN

## 2017-07-13 MED ORDER — ONDANSETRON HCL 8 MG PO TABS: 8.0000 mg | ORAL_TABLET | Freq: Two times a day (BID) | ORAL | 1 refills | Status: DC | PRN

## 2017-07-13 NOTE — Telephone Encounter (Signed)
Called patient regarding updated scheduled

## 2017-07-14 ENCOUNTER — Telehealth: Payer: Self-pay | Admitting: *Deleted

## 2017-07-14 NOTE — Telephone Encounter (Signed)
Received call from [facility] to verify rx for emla cream.  Reviewed with Dr. Lebron Conners.  Pt receiving velcade, no PAC to be placed.  This RN returned call to [facility] to cancel RX for emla cream.

## 2017-07-15 ENCOUNTER — Encounter: Payer: Self-pay | Admitting: Hematology and Oncology

## 2017-07-15 NOTE — Progress Notes (Signed)
[place] [facility] Follow-up Visit:  Assessment: Multiple myeloma not having achieved remission [facility]) 83 y.o. male with initial presentation of dysproteinemia with 3.9 g/dL of monoclonal protein in peripheral blood. On presentation, patient did have mild anemia, somewhat decreased renal function, no hypercalcemia, and early neuropathy.we conducted additional assessment demonstrating rising levels of clinical protein, protein was identified as IgG lambda. Patient has depressed levels of IgA and IgM with high levels of IgG exceeding 6 g/dL. Kappa to lambda ratio is 0.03, approaching diagnostic threshold for multiple myeloma by itself. Our suspicion for multiple myeloma is much more likely at this point in time. Due to high level of their protein, faster assessment and more involved assessment or indicated  Plan: --PET/CT --schedule bone marrow biopsy for tissue diagnosis and risk stratification. --consult Education officer, museum, as well as home visiting nurse come home PT/home OT for complete evaluation for the patient's suitability of remaining independent living. It is my suspicion that he will need to be transferred to assisted living or skilled nursing facility based on performance status and anticipated need for therapy. --consult dentistry for assessment for possible need to use zoledronic acid --when the evaluation and staging is completed, I plan on taking patient with combinational bortezomib and dexamethasone based on anticipated tolerance and ease of administration.  --return to clinic in two weeks to discuss results of final stages of assessment and to Initiate therapy with dexamethasone and bortezomib  Voice recognition software was used and creation of this note. Despite my best effort at editing the text, some misspelling/errors may have occurred.   Orders Placed This Encounter  Procedures  . CBC with Differential    Standing Status:   Future    Standing Expiration Date:    07/09/2018  . Comprehensive metabolic panel    Standing Status:   Future    Standing Expiration Date:   07/09/2018  . Lactate dehydrogenase (LDH)    Standing Status:   Future    Standing Expiration Date:   07/09/2018  . Magnesium    Standing Status:   Future    Standing Expiration Date:   07/09/2018  . Ambulatory referral to Social Work    Referral Priority:   Routine    Referral Type:   Consultation    Referral Reason:   Specialty Services Required    Number of Visits Requested:   1  . Ambulatory referral to Physical Therapy    Referral Priority:   Routine    Referral Type:   Physical Medicine    Referral Reason:   Specialty Services Required    Requested Specialty:   Physical Therapy    Number of Visits Requested:   1  . Ambulatory referral to Occupational Therapy    Referral Priority:   Routine    Referral Type:   Occupational Therapy    Referral Reason:   Specialty Services Required    Requested Specialty:   Occupational Therapy    Number of Visits Requested:   1  . Ambulatory referral to Home Health    Referral Priority:   Routine    Referral Type:   Home Health Care    Referral Reason:   Specialty Services Required    Requested Specialty:   Opp    Number of Visits Requested:   1  . Skilled nursing visits    Cancer Staging No matching staging information was found for the patient.  All questions were answered.  . The patient knows to call  the clinic with any problems, questions or concerns.  This note was electronically signed.    History of Presenting Illness Peter Wolfe is an 83 y.o. male followed in the [place] for multiple myeloma. Patient was found with total protein number on routine lab work and was referred for our evaluation after for pheresis demonstrated presence of monoclonal spike. Overall, patient reports feeling well. He denies any fevers, chills, night sweats. As any skeletal pain, but acknowledges some weight loss  losing about 15 pounds from his baseline over the past year. He does have good appetite and reports eating abundant ice cream, without nausea, vomiting, abdominal pain, early satiety, diarrhea, but with some history of constipation. He denies any chest pain, shortness of breath or cough. Denies any urinary symptoms. He does acknowledge that he has been having difficulties with numbness and tingling in bilateral feet which is new for him. He has a history of years of [employer] service including working in [place] with possible [employer] exposure.  Patient returns to the clinic today to discuss his suspected diagnosis of multiple myeloma, this time accompanied by his two nieces who flew in from [place] to attend the visit. Patient was supposed to obtain a bone thereby action pet scan since last visit to the clinic, but postponed further investigation until his family had a chance to ask the questions. In the interim, he denies any new symptoms, but overall is starting to become weaker and less capable of taking care over activities of daily living.  Oncological/hematological History: --Labs, 03/24/17: tProt 10.0, Alb 3.7, Ca 9.9, Cr 1.3, AP 42; SPEP -- M-Spike 3.9g/dL; WBC 4.4, Hgb 10.2, Plt 158 --Labs, 06/18/17: tProt 11.5, Alb 3.7, Ca 9.5, Cr 1.5, AP 45; SPEP -- M-spike 4.1g/dL, IgG lambda by SIFE; IgG 6524, IgA 6, IgM 7; kappa 4.4, lambda 171, KLR 0.03; LDH 127; WBC 3.7, Hgb 11.2, Plt 128; Viscosity 2.3; UPEP -- '38mg'$ /24hrs  Oncological/hematological History:  No history exists.    Medical History: Past Medical History:  Diagnosis Date  . BPH (benign prostatic hyperplasia)   . Broken rib   . COPD (chronic obstructive pulmonary disease) ([place])   . GERD (gastroesophageal reflux disease)   . GERD (gastroesophageal reflux disease)   . Hyperlipidemia   . Hypertension   . Insomnia   . Panic attack     Surgical History: Past Surgical History:  Procedure Laterality Date  . CHOLECYSTECTOMY       Family History: Family History  Problem Relation Age of Onset  . Emphysema Father   . Alcohol abuse Father   . Diabetes Sister   . Alzheimer's disease Sister     Social History: Social History   Social History  . Marital status: Single    Spouse name: N/A  . Number of children: N/A  . Years of education: N/A   Occupational History  . Not on file.   Social History Main Topics  . Smoking status: Never Smoker  . Smokeless tobacco: Never Used  . Alcohol use 0.0 oz/week     Comment: occasional  . Drug use: No  . Sexual activity: Not on file   Other Topics Concern  . Not on file   Social History Narrative   Retired - Sgt, Major    Not married              Allergies: Allergies  Allergen Reactions  . Influenza Virus Vacc Split Pf Other (See Comments)    Allergic to eggs  .  Doxycycline Rash  . Eggs Or Egg-Derived Products Rash    Medications:  Current Outpatient Prescriptions  Medication Sig Dispense Refill  . busPIRone (BUSPAR) 30 MG tablet TAKE 1 TABLET DAILY 100 tablet 3  . cloNIDine (CATAPRES) 0.1 MG tablet TAKE 1 TABLET DAILY 100 tablet 3  . FLUoxetine (PROZAC) 40 MG capsule TAKE ONE CAPSULE BY MOUTH ONCE DAILY 90 capsule 1  . mirtazapine (REMERON) 45 MG tablet TAKE ONE TABLET BY MOUTH AT BEDTIME 90 tablet 1  . Multiple Vitamin (MULTIVITAMIN) capsule Take 1 capsule by mouth daily.    Marland Kitchen omeprazole (PRILOSEC) 40 MG capsule Take 1 capsule (40 mg total) by mouth daily.    . risedronate (ACTONEL) 150 MG tablet Take 1 tablet (150 mg total) by mouth every 30 (thirty) days. with water on empty stomach, nothing by mouth or lie down for next 30 minutes. 1 tablet 3  . simvastatin (ZOCOR) 10 MG tablet TAKE 1 TABLET AT BEDTIME 100 tablet 3  . tamsulosin (FLOMAX) 0.4 MG CAPS capsule TAKE 1 CAPSULE DAILY 100 capsule 3  . traZODone (DESYREL) 50 MG tablet Take 50 mg by mouth at bedtime.    Marland Kitchen acyclovir (ZOVIRAX) 400 MG tablet Take 1 tablet (400 mg total) by mouth 2 (two)  times daily. 60 tablet 3  . dexamethasone (DECADRON) 4 MG tablet Take 2.5 tablets (10 mg) on Tue/Fri every week 80 tablet 3  . lidocaine-prilocaine (EMLA) cream Apply to affected area once 30 g 3  . LORazepam (ATIVAN) 0.5 MG tablet Take 1 tablet (0.5 mg total) by mouth every 6 (six) hours as needed (Nausea or vomiting). 30 tablet 0  . ondansetron (ZOFRAN) 8 MG tablet Take 1 tablet (8 mg total) by mouth 2 (two) times daily as needed (Nausea or vomiting). 30 tablet 1  . prochlorperazine (COMPAZINE) 10 MG tablet Take 1 tablet (10 mg total) by mouth every 6 (six) hours as needed (Nausea or vomiting). 30 tablet 1   No current facility-administered medications for this visit.     Review of Systems: Review of Systems  Constitutional: Positive for unexpected weight change. Negative for appetite change, chills, diaphoresis, fatigue and fever.  HENT:  Negative.   Eyes: Negative.   Respiratory: Negative.   Cardiovascular: Negative.   Gastrointestinal: Positive for constipation.  Endocrine: Negative.   Genitourinary: Negative.    Musculoskeletal: Negative.  Negative for gait problem.  Skin: Positive for itching.  Neurological: Positive for numbness. Negative for dizziness, extremity weakness, gait problem, headaches, light-headedness, seizures and speech difficulty.  Hematological: Negative.   Psychiatric/Behavioral: Negative.      PHYSICAL EXAMINATION Blood pressure (!) 159/85, pulse 88, temperature 98.7 F (37.1 C), temperature source Oral, resp. rate 18, height '5\' 5"'$  (1.651 m), weight 132 lb 12.8 oz (60.2 kg), SpO2 98 %.  ECOG PERFORMANCE STATUS: 2 - Symptomatic, <50% confined to bed  Physical Exam  Constitutional: He is oriented to person, place, and time and well-developed, well-nourished, and in no distress.  HENT:  Head: Normocephalic and atraumatic.  Mouth/Throat: Oropharynx is clear and moist. No oropharyngeal exudate.  Eyes: Pupils are equal, round, and reactive to light. No  scleral icterus.  Neck: Normal range of motion. No thyromegaly present.  Cardiovascular: Normal rate, regular rhythm and normal heart sounds.   No murmur heard. Pulmonary/Chest: Effort normal and breath sounds normal. No respiratory distress. He has no wheezes. He has no rales.  Abdominal: Soft. Bowel sounds are normal. He exhibits no distension and no mass. There is no tenderness.  There is no rebound.  Musculoskeletal: Normal range of motion. He exhibits no edema.  Lymphadenopathy:    He has no cervical adenopathy.  Neurological: He is alert and oriented to person, place, and time. He has normal reflexes. No cranial nerve deficit.  Skin: Skin is warm and dry. No rash noted. He is not diaphoretic. No erythema.     LABORATORY DATA: I have personally reviewed the data as listed: Appointment on 07/09/2017  Component Date Value Ref Range Status  . WBC 07/09/2017 2.7* 4.0 - 10.3 10e3/uL Final  . NEUT# 07/09/2017 1.3* 1.5 - 6.5 10e3/uL Final  . HGB 07/09/2017 10.0* 13.0 - 17.1 g/dL Final  . HCT 07/09/2017 29.3* 38.4 - 49.9 % Final  . Platelets 07/09/2017 123* 140 - 400 10e3/uL Final  . MCV 07/09/2017 100.2* 79.3 - 98.0 fL Final  . MCH 07/09/2017 34.3* 27.2 - 33.4 pg Final  . MCHC 07/09/2017 34.2  32.0 - 36.0 g/dL Final  . RBC 07/09/2017 2.92* 4.20 - 5.82 10e6/uL Final  . RDW 07/09/2017 15.4* 11.0 - 14.6 % Final  . lymph# 07/09/2017 1.0  0.9 - 3.3 10e3/uL Final  . MONO# 07/09/2017 0.4  0.1 - 0.9 10e3/uL Final  . Eosinophils Absolute 07/09/2017 0.0  0.0 - 0.5 10e3/uL Final  . Basophils Absolute 07/09/2017 0.0  0.0 - 0.1 10e3/uL Final  . NEUT% 07/09/2017 47.6  39.0 - 75.0 % Final  . LYMPH% 07/09/2017 36.7  14.0 - 49.0 % Final  . MONO% 07/09/2017 14.5* 0.0 - 14.0 % Final  . EOS% 07/09/2017 1.0  0.0 - 7.0 % Final  . BASO% 07/09/2017 0.2  0.0 - 2.0 % Final  . Sodium 07/09/2017 136  136 - 145 mEq/L Final  . Potassium 07/09/2017 4.1  3.5 - 5.1 mEq/L Final  . Chloride 07/09/2017 101  98 - 109  mEq/L Final  . CO2 07/09/2017 26  22 - 29 mEq/L Final  . Glucose 07/09/2017 117  70 - 140 mg/dl Final   Glucose reference range is for nonfasting patients. Fasting glucose reference range is 70- 100.  Marland Kitchen BUN 07/09/2017 21.5  7.0 - 26.0 mg/dL Final  . Creatinine 07/09/2017 1.6* 0.7 - 1.3 mg/dL Final  . Total Bilirubin 07/09/2017 0.57  0.20 - 1.20 mg/dL Final  . Alkaline Phosphatase 07/09/2017 52  40 - 150 U/L Final  . AST 07/09/2017 14  5 - 34 U/L Final  . ALT 07/09/2017 14  0 - 55 U/L Final  . Total Protein 07/09/2017 10.3* 6.4 - 8.3 g/dL Final  . Albumin 07/09/2017 3.3* 3.5 - 5.0 g/dL Final  . Calcium 07/09/2017 9.1  8.4 - 10.4 mg/dL Final  . Anion Gap 07/09/2017 9  3 - 11 mEq/L Final  . EGFR 07/09/2017 40* >60 ml/min/1.73 m2 Final   eGFR is calculated using the CKD-EPI Creatinine Equation (2009)  . Uric Acid, Serum 07/09/2017 7.1  2.6 - 7.4 mg/dl Final  . LDH 07/09/2017 132  125 - 245 U/L Final  . Beta-2 07/09/2017 6.7* 0.6 - 2.4 mg/L Final   S[employer]ens Immulite 2000 Immunochemiluminometric assay (ICMA)  . IgG, Qn, Serum 07/09/2017 5,526* 700 - 1600 mg/dL Final   Comment: Results confirmed on dilution.   . Viscosity, Serum 07/09/2017 2.3* 1.6 - 1.9 rel.saline Final   Comment: Values above 2.7 may indicate paraproteinemia is present. This test was developed and its performance characteristics determined by [facility]. It has not been cleared or approved by the Food and Drug Administration.  Ardath Sax, MD

## 2017-07-15 NOTE — Assessment & Plan Note (Signed)
83 y.o. male with initial presentation of dysproteinemia with 3.9 g/dL of monoclonal protein in peripheral blood. On presentation, patient did have mild anemia, somewhat decreased renal function, no hypercalcemia, and early neuropathy.we conducted additional assessment demonstrating rising levels of clinical protein, protein was identified as IgG lambda. Patient has depressed levels of IgA and IgM with high levels of IgG exceeding 6 g/dL. Kappa to lambda ratio is 0.03, approaching diagnostic threshold for multiple myeloma by itself. Our suspicion for multiple myeloma is much more likely at this point in time. Due to high level of their protein, faster assessment and more involved assessment or indicated  Plan: --PET/CT --schedule bone marrow biopsy for tissue diagnosis and risk stratification. --consult Education officer, museum, as well as home visiting nurse come home PT/home OT for complete evaluation for the patient's suitability of remaining independent living. It is my suspicion that he will need to be transferred to assisted living or skilled nursing facility based on performance status and anticipated need for therapy. --consult dentistry for assessment for possible need to use zoledronic acid --when the evaluation and staging is completed, I plan on taking patient with combinational bortezomib and dexamethasone based on anticipated tolerance and ease of administration.

## 2017-07-17 ENCOUNTER — Other Ambulatory Visit: Payer: Self-pay | Admitting: Radiology

## 2017-07-19 ENCOUNTER — Other Ambulatory Visit: Payer: Self-pay | Admitting: Radiology

## 2017-07-20 ENCOUNTER — Encounter (HOSPITAL_COMMUNITY)
Admission: RE | Admit: 2017-07-20 | Discharge: 2017-07-20 | Disposition: A | Discharge status: Home / Self Care | Payer: Medicare Other | Source: Ambulatory Visit | Attending: Hematology and Oncology | Admitting: Hematology and Oncology

## 2017-07-20 DIAGNOSIS — C9000 Multiple myeloma not having achieved remission: Secondary | ICD-10-CM

## 2017-07-20 LAB — GLUCOSE, CAPILLARY: GLUCOSE-CAPILLARY: 126 mg/dL — AB (ref 65–99)

## 2017-07-20 MED ORDER — FLUDEOXYGLUCOSE F - 18 (FDG) INJECTION
5.9000 | Freq: Once | INTRAVENOUS | Status: AC | PRN
  Administered 2017-07-20: 5.9 via INTRAVENOUS

## 2017-07-21 ENCOUNTER — Ambulatory Visit (HOSPITAL_COMMUNITY)
Admission: RE | Admit: 2017-07-21 | Discharge: 2017-07-21 | Disposition: A | Discharge status: Home / Self Care | Payer: Medicare Other | Source: Ambulatory Visit | Attending: Hematology and Oncology | Admitting: Hematology and Oncology

## 2017-07-21 ENCOUNTER — Telehealth: Payer: Self-pay

## 2017-07-21 ENCOUNTER — Encounter (HOSPITAL_COMMUNITY): Payer: Self-pay

## 2017-07-21 DIAGNOSIS — G4700 Insomnia, unspecified: Secondary | ICD-10-CM | POA: Diagnosis not present

## 2017-07-21 DIAGNOSIS — Z811 Family history of alcohol abuse and dependence: Secondary | ICD-10-CM | POA: Diagnosis not present

## 2017-07-21 DIAGNOSIS — Z79899 Other long term (current) drug therapy: Secondary | ICD-10-CM | POA: Insufficient documentation

## 2017-07-21 DIAGNOSIS — D61818 Other pancytopenia: Secondary | ICD-10-CM | POA: Diagnosis not present

## 2017-07-21 DIAGNOSIS — J449 Chronic obstructive pulmonary disease, unspecified: Secondary | ICD-10-CM | POA: Insufficient documentation

## 2017-07-21 DIAGNOSIS — Z881 Allergy status to other antibiotic agents status: Secondary | ICD-10-CM | POA: Insufficient documentation

## 2017-07-21 DIAGNOSIS — F410 Panic disorder [episodic paroxysmal anxiety] without agoraphobia: Secondary | ICD-10-CM | POA: Diagnosis not present

## 2017-07-21 DIAGNOSIS — Z825 Family history of asthma and other chronic lower respiratory diseases: Secondary | ICD-10-CM | POA: Insufficient documentation

## 2017-07-21 DIAGNOSIS — I10 Essential (primary) hypertension: Secondary | ICD-10-CM | POA: Insufficient documentation

## 2017-07-21 DIAGNOSIS — D4989 Neoplasm of unspecified behavior of other specified sites: Secondary | ICD-10-CM | POA: Diagnosis not present

## 2017-07-21 DIAGNOSIS — Z9049 Acquired absence of other specified parts of digestive tract: Secondary | ICD-10-CM | POA: Diagnosis not present

## 2017-07-21 DIAGNOSIS — Z91012 Allergy to eggs: Secondary | ICD-10-CM | POA: Diagnosis not present

## 2017-07-21 DIAGNOSIS — Z8579 Personal history of other malignant neoplasms of lymphoid, hematopoietic and related tissues: Secondary | ICD-10-CM | POA: Diagnosis not present

## 2017-07-21 DIAGNOSIS — N400 Enlarged prostate without lower urinary tract symptoms: Secondary | ICD-10-CM | POA: Insufficient documentation

## 2017-07-21 DIAGNOSIS — Z887 Allergy status to serum and vaccine status: Secondary | ICD-10-CM | POA: Insufficient documentation

## 2017-07-21 DIAGNOSIS — E785 Hyperlipidemia, unspecified: Secondary | ICD-10-CM | POA: Insufficient documentation

## 2017-07-21 DIAGNOSIS — C9000 Multiple myeloma not having achieved remission: Secondary | ICD-10-CM | POA: Insufficient documentation

## 2017-07-21 DIAGNOSIS — Z833 Family history of diabetes mellitus: Secondary | ICD-10-CM | POA: Insufficient documentation

## 2017-07-21 DIAGNOSIS — K219 Gastro-esophageal reflux disease without esophagitis: Secondary | ICD-10-CM | POA: Diagnosis not present

## 2017-07-21 LAB — BASIC METABOLIC PANEL
ANION GAP: 10 (ref 5–15)
BUN: 23 mg/dL — ABNORMAL HIGH (ref 6–20)
CALCIUM: 9.1 mg/dL (ref 8.9–10.3)
CO2: 25 mmol/L (ref 22–32)
CREATININE: 1.34 mg/dL — AB (ref 0.61–1.24)
Chloride: 93 mmol/L — ABNORMAL LOW (ref 101–111)
GFR calc Af Amer: 54 mL/min — ABNORMAL LOW (ref >60)
GFR calc non Af Amer: 46 mL/min — ABNORMAL LOW (ref >60)
GLUCOSE: 100 mg/dL — AB (ref 65–99)
POTASSIUM: 5.2 mmol/L — AB (ref 3.5–5.1)
Sodium: 128 mmol/L — ABNORMAL LOW (ref 135–145)

## 2017-07-21 LAB — CBC WITH DIFFERENTIAL/PLATELET
BASOS PCT: 0 %
Basophils Absolute: 0 10*3/uL (ref 0.0–0.1)
EOS ABS: 0 10*3/uL (ref 0.0–0.7)
Eosinophils Relative: 1 %
HEMATOCRIT: 31.2 % — AB (ref 39.0–52.0)
HEMOGLOBIN: 10.9 g/dL — AB (ref 13.0–17.0)
LYMPHS ABS: 1.9 10*3/uL (ref 0.7–4.0)
Lymphocytes Relative: 48 %
MCH: 34.4 pg — AB (ref 26.0–34.0)
MCHC: 34.9 g/dL (ref 30.0–36.0)
MCV: 98.4 fL (ref 78.0–100.0)
MONO ABS: 0.3 10*3/uL (ref 0.1–1.0)
MONOS PCT: 9 %
NEUTROS PCT: 43 %
Neutro Abs: 1.7 10*3/uL (ref 1.7–7.7)
Platelets: 108 10*3/uL — ABNORMAL LOW (ref 150–400)
RBC: 3.17 MIL/uL — ABNORMAL LOW (ref 4.22–5.81)
RDW: 14.8 % (ref 11.5–15.5)
WBC: 3.9 10*3/uL — ABNORMAL LOW (ref 4.0–10.5)

## 2017-07-21 LAB — PROTIME-INR
INR: 1.09
PROTHROMBIN TIME: 14 s (ref 11.4–15.2)

## 2017-07-21 MED ORDER — FLUMAZENIL 0.5 MG/5ML IV SOLN
INTRAVENOUS | Status: AC
  Filled 2017-07-21: qty 5

## 2017-07-21 MED ORDER — FENTANYL CITRATE (PF) 100 MCG/2ML IJ SOLN
INTRAMUSCULAR | Status: AC
  Filled 2017-07-21: qty 4

## 2017-07-21 MED ORDER — SODIUM CHLORIDE 0.9 % IV SOLN
INTRAVENOUS | Status: DC
  Administered 2017-07-21: 09:00:00 via INTRAVENOUS

## 2017-07-21 MED ORDER — NALOXONE HCL 0.4 MG/ML IJ SOLN
INTRAMUSCULAR | Status: AC
  Filled 2017-07-21: qty 1

## 2017-07-21 MED ORDER — LIDOCAINE HCL (PF) 1 % IJ SOLN
INTRAMUSCULAR | Status: AC | PRN
  Administered 2017-07-21: 10 mL via INTRADERMAL

## 2017-07-21 MED ORDER — FENTANYL CITRATE (PF) 100 MCG/2ML IJ SOLN
INTRAMUSCULAR | Status: AC | PRN
  Administered 2017-07-21: 50 ug via INTRAVENOUS
  Administered 2017-07-21: 25 ug via INTRAVENOUS

## 2017-07-21 MED ORDER — MIDAZOLAM HCL 2 MG/2ML IJ SOLN
INTRAMUSCULAR | Status: AC
  Filled 2017-07-21: qty 4

## 2017-07-21 MED ORDER — MIDAZOLAM HCL 2 MG/2ML IJ SOLN
INTRAMUSCULAR | Status: AC | PRN
  Administered 2017-07-21: 1 mg via INTRAVENOUS
  Administered 2017-07-21: 0.5 mg via INTRAVENOUS

## 2017-07-21 NOTE — Telephone Encounter (Signed)
Received phone call from pt family member, Minette Brine. Pt had PET scan yesterday and is having a BMX today. Patty would like to speak with Dr. Lebron Conners about PET results and BMX results once they are received as she will not be in town for pt appointment next week. Most likely plan is for pt to be moved to [place] because of distance from family. No relatives in the area, and nearest family is in [place]. Note placed on Dr. Clydene Laming desk requesting communication with pt and family.

## 2017-07-21 NOTE — Sedation Documentation (Signed)
Patient denies pain and is resting comfortably.  

## 2017-07-21 NOTE — H&P (Signed)
Chief Complaint: Patient was seen in consultation today for multiple myeloma  Referring Physician(s): Perlov,Mikhail G  Supervising Physician: Sandi Mariscal  Patient Status: [facility] - Out-pt  History of Present Illness: SEVIN LANGENBACH Wolfe is a 83 y.o. male with past medical history of COPD, GERD, HLD, HTN and suspected multiple myeloma based on decreased IgA and IgM, and elevated IgG.   IR consulted for bone marrow biopsy for further evaluation and staging at the request of Dr. Lebron Conners.   Patient presents for procedure today in his usual state of health.  He has been NPO. He does not take blood thinners.   Past Medical History:  Diagnosis Date  . BPH (benign prostatic hyperplasia)   . Broken rib   . COPD (chronic obstructive pulmonary disease) ([place])   . GERD (gastroesophageal reflux disease)   . GERD (gastroesophageal reflux disease)   . Hyperlipidemia   . Hypertension   . Insomnia   . Panic attack     Past Surgical History:  Procedure Laterality Date  . CHOLECYSTECTOMY      Allergies: Influenza virus vacc split pf; Doxycycline; and Eggs or egg-derived products  Medications: Prior to Admission medications   Medication Sig Start Date End Date Taking? Authorizing Provider  acyclovir (ZOVIRAX) 400 MG tablet Take 1 tablet (400 mg total) by mouth 2 (two) times daily. 07/13/17   Ardath Sax, MD  busPIRone (BUSPAR) 30 MG tablet TAKE 1 TABLET DAILY 07/29/16   Dorena Cookey, MD  cloNIDine (CATAPRES) 0.1 MG tablet TAKE 1 TABLET DAILY 07/29/16   Dorena Cookey, MD  dexamethasone (DECADRON) 4 MG tablet Take 2.5 tablets (10 mg) on Tue/Fri every week 07/13/17   Ardath Sax, MD  FLUoxetine (PROZAC) 40 MG capsule TAKE ONE CAPSULE BY MOUTH ONCE DAILY 04/14/17   [name], NP  lidocaine-prilocaine (EMLA) cream Apply to affected area once 07/13/17   Perlov, Marinell Blight, MD  LORazepam (ATIVAN) 0.5 MG tablet Take 1 tablet (0.5 mg total) by mouth every 6 (six) hours as  needed (Nausea or vomiting). 07/13/17   Ardath Sax, MD  mirtazapine (REMERON) 45 MG tablet TAKE ONE TABLET BY MOUTH AT BEDTIME 05/12/17   Nafziger, Tommi Rumps, NP  Multiple Vitamin (MULTIVITAMIN) capsule Take 1 capsule by mouth daily.    [provider]  omeprazole (PRILOSEC) 40 MG capsule Take 1 capsule (40 mg total) by mouth daily. 01/05/17   Domenic Polite, MD  ondansetron (ZOFRAN) 8 MG tablet Take 1 tablet (8 mg total) by mouth 2 (two) times daily as needed (Nausea or vomiting). 07/13/17   Ardath Sax, MD  prochlorperazine (COMPAZINE) 10 MG tablet Take 1 tablet (10 mg total) by mouth every 6 (six) hours as needed (Nausea or vomiting). 07/13/17   Ardath Sax, MD  risedronate (ACTONEL) 150 MG tablet Take 1 tablet (150 mg total) by mouth every 30 (thirty) days. with water on empty stomach, nothing by mouth or lie down for next 30 minutes. 04/07/17   Elayne Snare, MD  simvastatin (ZOCOR) 10 MG tablet TAKE 1 TABLET AT BEDTIME 07/29/16   Dorena Cookey, MD  tamsulosin (FLOMAX) 0.4 MG CAPS capsule TAKE 1 CAPSULE DAILY 07/29/16   Dorena Cookey, MD  traZODone (DESYREL) 50 MG tablet Take 50 mg by mouth at bedtime.    [provider]     Family History  Problem Relation Age of Onset  . Emphysema Father   . Alcohol abuse Father   . Diabetes Sister   .  Alzheimer's disease Sister     Social History   Social History  . Marital status: Single    Spouse name: N/A  . Number of children: N/A  . Years of education: N/A   Social History Main Topics  . Smoking status: Never Smoker  . Smokeless tobacco: Never Used  . Alcohol use 0.0 oz/week     Comment: occasional  . Drug use: No  . Sexual activity: Not Asked   Other Topics Concern  . None   Social History Narrative   Retired - Sgt, [employer]    Not married              Review of Systems  Constitutional: Positive for fatigue. Negative for fever.  Respiratory: Negative for cough and shortness of breath.     Cardiovascular: Negative for chest pain.  Gastrointestinal: Negative for abdominal pain.  Musculoskeletal: Negative for back pain.  Psychiatric/Behavioral: Negative for behavioral problems and confusion.    Vital Signs: BP (!) 167/83 (BP Location: Left Arm)   Pulse 86   Temp 98.2 F (36.8 C) (Oral)   Resp 18   SpO2 95%   Physical Exam  Constitutional: He is oriented to person, place, and time. He appears well-developed.  Cardiovascular: Normal rate, regular rhythm and normal heart sounds.   Pulmonary/Chest: Effort normal and breath sounds normal.  Abdominal: Soft.  Neurological: He is alert and oriented to person, place, and time.  Skin: Skin is warm and dry.  Psychiatric: He has a normal mood and affect. His behavior is normal. Judgment and thought content normal.  Nursing note and vitals reviewed.   Imaging: Nm Pet Image Initial (pi) Whole Body  Result Date: 07/20/2017 CLINICAL DATA:  Initial treatment strategy for multiple myeloma. EXAM: NUCLEAR MEDICINE PET WHOLE BODY TECHNIQUE: 5.9 mCi F-18 FDG was injected intravenously. Full-ring PET imaging was performed from the vertex to the feet after the radiotracer. CT data was obtained and used for attenuation correction and anatomic localization. FASTING BLOOD GLUCOSE:  Value: 126 mg/dl COMPARISON:  CT scans of the spine dated 12/29/2016 ; CT abdomen from 09/19/2004 FINDINGS: HEAD/NECK No hypermetabolic activity in the scalp. No hypermetabolic cervical lymph nodes. Chronic right maxillary sinusitis. Subtle accentuated activity along the upper margin of the right thyroid gland were there is a small focus of hypodensity at about 6 mm in diameter, maximum SUV 3.7. CHEST No hypermetabolic mediastinal or hilar nodes. No suspicious pulmonary nodules on the CT data. Large hiatal hernia containing most of the stomach. Mild atelectasis in the lingula, right middle lobe, and right lower lobe. Passive atelectasis in the right lower lobe adjacent to  the hiatal hernia. ABDOMEN/PELVIS No abnormal hypermetabolic activity within the liver, pancreas, adrenal glands, or spleen. No hypermetabolic lymph nodes in the abdomen or pelvis. Aortoiliac atherosclerotic vascular disease. Cholecystectomy. Common bile duct 1.5 cm in diameter. Mildly accentuated activity in the left side of the prostate gland compared to the right, maximum SUV 4.4. Scattered physiologic activity in bowel. SKELETON Subacute fracture the right ninth rib laterally with focal mildly accentuated activity, probably benign, maximum SUV 3.9. The patient has a known T9 compression fracture with associated sclerosis but without significant accentuated metabolic activity at this level. Transverse fracture of the sternal body with low-grade associated metabolic activity likely from healing response, maximum SUV 3.2. Dextroconvex lumbar scoliosis with rotary component. Multilevel degenerative subluxations in the lumbar spine with prominent degenerative disc disease and underlying spondylosis, but no significantly accentuated metabolic activity to suggest underlying malignancy. There  is some accentuated activity in the third metatarsal of the left foot which could be a manifestation of stress reaction but is technically nonspecific, consider dedicated radiography for further characterization. EXTREMITIES Fatty mass of the left upper vastus medialis muscle medially measuring 2.9 by 3.1 by 12.0 cm, with some faint internal stranding, and low-grade but accentuated metabolic activity with maximum standard uptake value of approximately 3.1. IMPRESSION: 1. The only abnormal osseous activity is low-grade and appears to be associated with posttraumatic findings including a right rib fracture, a sternal fracture, and a T9 vertebral fracture with healing response. 2. This fatty mass of the medial aspect of the left upper vastus medialis muscle which has some faint internal stranding as well as accentuated metabolic  activity (maximum SUV 3.1), suspicious for well differentiated liposarcoma. An SUV this high would be highly unusual for a lipoma. 3. Questionable thyroid nodule, with some faint focal accentuated activity along the right upper thyroid gland. Consider thyroid ultrasound for further assessment. A significant minority of hypermetabolic thyroid lesions may be malignant, but I am unsure that there is a nodule in the right upper thyroid lobe and the degree of activity, although focal, is only borderline increased. 4. Asymmetric accentuated activity in the left side of the prostate gland. Correlate with PSA level in assessing for prostate adenocarcinoma. 5. Other imaging findings of potential clinical significance: Chronic right maxillary sinusitis. Large hiatal hernia. Aortic Atherosclerosis (ICD10-I70.0). Dilated common bile duct at 1.5 cm in diameter, cause uncertain. Lumbar scoliosis, spondylosis, and degenerative disc disease. Electronically Signed   By: Van Clines M.D.   On: 07/20/2017 16:00    Labs:  CBC:  Recent Labs  03/24/17 1149 06/18/17 1207 07/09/17 0953 07/21/17 0845  WBC 4.4 3.7* 2.7* 3.9*  HGB 10.2* 11.2* 10.0* 10.9*  HCT 29.3* 33.3* 29.3* 31.2*  PLT 158.0 128* 123* 108*    COAGS:  Recent Labs  07/21/17 0845  INR 1.09    BMP:  Recent Labs  01/02/17 0737  01/03/17 0401  01/04/17 0248  02/20/17 1348 03/24/17 1149 06/18/17 1207 07/09/17 0953 07/21/17 0845  NA 134*  < > 133*  < > 134*  < > 132* 131* 136 136 128*  K 3.4*  < > 3.5  --  3.3*  < > 4.1 4.1 4.0 4.1 5.2*  CL 100*  --  99*  --  101  --  98 97  --   --  93*  CO2 25  --  25  --  27  --  28 27 25 26 25   GLUCOSE 85  --  106*  --  89  --  96 109* 98 117 100*  BUN 20  < > 22*  < > 19  < > 16 20 29.1* 21.5 23*  CALCIUM 8.7*  --  8.7*  --  8.7*  --  9.8 9.9 9.5 9.1 9.1  CREATININE 1.73*  < > 1.52*  < > 1.40*  < > 1.29 1.32 1.5* 1.6* 1.34*  GFRNONAA 34*  --  40*  --  44*  --   --   --   --   --  46*    GFRAA 40*  --  46*  --  51*  --   --   --   --   --  54*  < > = values in this interval not displayed.  LIVER FUNCTION TESTS:  Recent Labs  12/29/16 0827 03/24/17 1149 03/27/17 0953 06/18/17 1207 07/09/17 0953  BILITOT  1.2 0.4  --  0.82 0.57  AST 19 11  --  15 14  ALT 17 12  --  12 14  ALKPHOS 77 42  --  45 52  PROT 9.6* 10.0* 9.6* 11.5*  10.7* 10.3*  ALBUMIN 3.3* 3.7  --  3.7 3.3*    TUMOR MARKERS: No results for input(s): AFPTM, CEA, CA199, CHROMGRNA in the last 8760 hours.  Assessment and Plan: Patient with past medical history of anemia presents with complaint of suspected multiple myeloma.  IR consulted for bone marrow biopsy at the request of Dr. Lebron Conners.  Patient presents today in their usual state of health.  He has been NPO and is not currently on blood thinners.  Risks and benefits discussed with the patient including, but not limited to bleeding, infection, damage to adjacent structures or low yield requiring additional tests. All of the patient's questions were answered, patient is agreeable to proceed. Consent signed and in chart.  Thank you for this interesting consult.  I greatly enjoyed meeting Peter Wolfe and look forward to participating in their care.  A copy of this report was sent to the requesting provider on this date.  Electronically Signed: Docia Barrier, PA 07/21/2017, 10:20 AM   I spent a total of  30 Minutes   in face to face in clinical consultation, greater than 50% of which was counseling/coordinating care for multiple myeloma

## 2017-07-21 NOTE — Discharge Instructions (Signed)
Bone Marrow Aspiration and Bone Marrow Biopsy, Adult, Care After °This sheet gives you information about how to care for yourself after your procedure. Your health care provider may also give you more specific instructions. If you have problems or questions, contact your health care provider. °What can I expect after the procedure? °After the procedure, it is common to have: °· Mild pain and tenderness. °· Swelling. °· Bruising. ° °Follow these instructions at home: °· Take over-the-counter or prescription medicines only as told by your health care provider. °· Do not take baths, swim, or use a hot tub until your health care provider approves. Ask if you can take a shower or have a sponge bath. °· Follow instructions from your health care provider about how to take care of the puncture site. Make sure you: °? Wash your hands with soap and water before you change your bandage (dressing). If soap and water are not available, use hand sanitizer. °? Change your dressing as told by your health care provider. °· Check your puncture site every day for signs of infection. Check for: °? More redness, swelling, or pain. °? More fluid or blood. °? Warmth. °? Pus or a bad smell. °· Return to your normal activities as told by your health care provider. Ask your health care provider what activities are safe for you. °· Do not drive for 24 hours if you were given a medicine to help you relax (sedative). °· Keep all follow-up visits as told by your health care provider. This is important. °Contact a health care provider if: °· You have more redness, swelling, or pain around the puncture site. °· You have more fluid or blood coming from the puncture site. °· Your puncture site feels warm to the touch. °· You have pus or a bad smell coming from the puncture site. °· You have a fever. °· Your pain is not controlled with medicine. °This information is not intended to replace advice given to you by your health care provider. Make sure  you discuss any questions you have with your health care provider. °Document Released: 04/04/2005 Document Revised: 04/04/2016 Document Reviewed: 02/27/2016 °Elsevier Interactive Patient Education © 2018 [employer] ° °Moderate Conscious Sedation, Adult, Care After °These instructions provide you with information about caring for yourself after your procedure. Your health care provider may also give you more specific instructions. Your treatment has been planned according to current medical practices, but problems sometimes occur. Call your health care provider if you have any problems or questions after your procedure. °What can I expect after the procedure? °After your procedure, it is common: °· To feel sleepy for several hours. °· To feel clumsy and have poor balance for several hours. °· To have poor judgment for several hours. °· To vomit if you eat too soon. ° °Follow these instructions at home: °For at least 24 hours after the procedure: ° °· Do not: °? Participate in activities where you could fall or become injured. °? Drive. °? Use heavy machinery. °? Drink alcohol. °? Take sleeping pills or medicines that cause drowsiness. °? Make important decisions or sign legal documents. °? Take care of children on your own. °· Rest. °Eating and drinking °· Follow the diet recommended by your health care provider. °· If you vomit: °? Drink water, juice, or soup when you can drink without vomiting. °? Make sure you have little or no nausea before eating solid foods. °General instructions °· Have a responsible adult stay with you until you   are awake and alert.  Take over-the-counter and prescription medicines only as told by your health care provider.  If you smoke, do not smoke without supervision.  Keep all follow-up visits as told by your health care provider. This is important. Contact a health care provider if:  You keep feeling nauseous or you keep vomiting.  You feel light-headed.  You develop a  rash.  You have a fever. Get help right away if:  You have trouble breathing. This information is not intended to replace advice given to you by your health care provider. Make sure you discuss any questions you have with your health care provider. Document Released: 07/06/2013 Document Revised: 02/18/2016 Document Reviewed: 01/05/2016 Elsevier Interactive Patient Education  [employer].

## 2017-07-21 NOTE — Procedures (Signed)
Pre-procedure Diagnosis: Multiple Myeloma Post-procedure Diagnosis: Same  Technically successful CT guided bone marrow aspiration and biopsy of left iliac crest.   Complications: None Immediate  EBL: None  SignedSandi Mariscal Pager: [phone] 07/21/2017, 12:03 PM

## 2017-07-22 ENCOUNTER — Encounter: Payer: Self-pay | Admitting: Podiatry

## 2017-07-22 ENCOUNTER — Ambulatory Visit (INDEPENDENT_AMBULATORY_CARE_PROVIDER_SITE_OTHER): Payer: Medicare Other | Admitting: Podiatry

## 2017-07-22 VITALS — BP 173/102 | HR 77

## 2017-07-22 DIAGNOSIS — M79675 Pain in left toe(s): Secondary | ICD-10-CM

## 2017-07-22 DIAGNOSIS — B351 Tinea unguium: Secondary | ICD-10-CM

## 2017-07-22 DIAGNOSIS — M79674 Pain in right toe(s): Secondary | ICD-10-CM

## 2017-07-22 NOTE — Progress Notes (Signed)
   Subjective:    Patient ID: Peter Wolfe, male    DOB: December 25, 1928, 83 y.o.   MRN: [id number]  HPI this patient presents the office with chief complaint of long thick painful nails.  This patient states that the nails are painful walking and wearing his shoes.  He is brought to this office by his niece .  This patient has not had his nails done in approximately 1 year.  Patient is unable to self treat.  Patient has been diagnosed having multiple myeloma.  This patient states he has been running until the age of 89.  He presents the office today for an evaluation and treatment of these long thick painful nails    Review of Systems  All other systems reviewed and are negative.      Objective:   Physical Exam General Appearance  Alert, conversant and in no acute stress.  Vascular  Dorsalis pedis and posterior pulses are palpable  bilaterally.  Capillary return is within normal limits  Bilaterally. Temperature is within normal limits  Bilaterally  Neurologic  Senn-Weinstein monofilament wire test within normal limits  bilaterally. Muscle power  Within normal limits bilaterally.  Nails Thick disfigured discolored nails with subungual debride bilaterally from hallux to fifth toes bilaterally. No evidence of bacterial infection or drainage bilaterally.  Orthopedic  No limitations of motion of motion feet bilaterally.  No crepitus or effusions noted.  HAV with hallux malleus  B/L. Increased swelling achilles tendon.    Skin  normotropic skin with no porokeratosis noted bilaterally.  No signs of infections or ulcers noted.          Assessment & Plan:  Onychomycosis  B/L    IE  Debridement of nails  X 10.  RTC 3 months.   Gardiner Barefoot DPM

## 2017-07-27 ENCOUNTER — Other Ambulatory Visit: Payer: Medicare Other

## 2017-07-27 ENCOUNTER — Other Ambulatory Visit: Payer: Self-pay

## 2017-07-27 ENCOUNTER — Telehealth: Payer: Self-pay

## 2017-07-27 ENCOUNTER — Other Ambulatory Visit: Payer: Self-pay | Admitting: Hematology and Oncology

## 2017-07-27 DIAGNOSIS — C9000 Multiple myeloma not having achieved remission: Secondary | ICD-10-CM

## 2017-07-27 MED ORDER — MORPHINE SULFATE 15 MG PO TABS: 15.0000 mg | ORAL_TABLET | ORAL | 0 refills | Status: DC | PRN

## 2017-07-27 NOTE — Telephone Encounter (Signed)
Letter mailed to patient's niece Otho Perl. See copy of letter under letters tab.

## 2017-07-30 ENCOUNTER — Ambulatory Visit (HOSPITAL_BASED_OUTPATIENT_CLINIC_OR_DEPARTMENT_OTHER): Payer: Medicare Other

## 2017-07-30 ENCOUNTER — Ambulatory Visit (HOSPITAL_BASED_OUTPATIENT_CLINIC_OR_DEPARTMENT_OTHER): Payer: Medicare Other | Admitting: Hematology and Oncology

## 2017-07-30 ENCOUNTER — Other Ambulatory Visit (HOSPITAL_BASED_OUTPATIENT_CLINIC_OR_DEPARTMENT_OTHER): Payer: Medicare Other

## 2017-07-30 DIAGNOSIS — Z5112 Encounter for antineoplastic immunotherapy: Secondary | ICD-10-CM | POA: Diagnosis not present

## 2017-07-30 DIAGNOSIS — C9000 Multiple myeloma not having achieved remission: Secondary | ICD-10-CM | POA: Diagnosis not present

## 2017-07-30 DIAGNOSIS — I10 Essential (primary) hypertension: Secondary | ICD-10-CM

## 2017-07-30 LAB — CBC WITH DIFFERENTIAL/PLATELET
BASO%: 0.4 % (ref 0.0–2.0)
Basophils Absolute: 0 10*3/uL (ref 0.0–0.1)
EOS ABS: 0 10*3/uL (ref 0.0–0.5)
EOS%: 0.3 % (ref 0.0–7.0)
HEMATOCRIT: 27.5 % — AB (ref 38.4–49.9)
HGB: 9.2 g/dL — ABNORMAL LOW (ref 13.0–17.1)
LYMPH#: 0.9 10*3/uL (ref 0.9–3.3)
LYMPH%: 22 % (ref 14.0–49.0)
MCH: 34.6 pg — ABNORMAL HIGH (ref 27.2–33.4)
MCHC: 33.6 g/dL (ref 32.0–36.0)
MCV: 103 fL — AB (ref 79.3–98.0)
MONO#: 0.5 10*3/uL (ref 0.1–0.9)
MONO%: 11.6 % (ref 0.0–14.0)
NEUT#: 2.7 10*3/uL (ref 1.5–6.5)
NEUT%: 65.7 % (ref 39.0–75.0)
PLATELETS: 114 10*3/uL — AB (ref 140–400)
RBC: 2.67 10*6/uL — AB (ref 4.20–5.82)
RDW: 15.7 % — ABNORMAL HIGH (ref 11.0–14.6)
WBC: 4.1 10*3/uL (ref 4.0–10.3)

## 2017-07-30 LAB — COMPREHENSIVE METABOLIC PANEL
ALK PHOS: 46 U/L (ref 40–150)
ALT: 10 U/L (ref 0–55)
ANION GAP: 8 meq/L (ref 3–11)
AST: 14 U/L (ref 5–34)
Albumin: 2.9 g/dL — ABNORMAL LOW (ref 3.5–5.0)
BUN: 20.7 mg/dL (ref 7.0–26.0)
CALCIUM: 8.7 mg/dL (ref 8.4–10.4)
CHLORIDE: 107 meq/L (ref 98–109)
CO2: 24 mEq/L (ref 22–29)
Creatinine: 1.3 mg/dL (ref 0.7–1.3)
EGFR: 50 mL/min/{1.73_m2} — AB (ref >60)
Glucose: 163 mg/dl — ABNORMAL HIGH (ref 70–140)
Potassium: 3.8 mEq/L (ref 3.5–5.1)
Sodium: 139 mEq/L (ref 136–145)
TOTAL PROTEIN: 9.6 g/dL — AB (ref 6.4–8.3)
Total Bilirubin: 0.88 mg/dL (ref 0.20–1.20)

## 2017-07-30 LAB — LACTATE DEHYDROGENASE: LDH: 126 U/L (ref 125–245)

## 2017-07-30 LAB — MAGNESIUM: MAGNESIUM: 1.7 mg/dL (ref 1.5–2.5)

## 2017-07-30 MED ORDER — PROCHLORPERAZINE MALEATE 10 MG PO TABS: 10.0000 mg | ORAL_TABLET | Freq: Once | ORAL | Status: DC

## 2017-07-30 MED ORDER — PROCHLORPERAZINE MALEATE 10 MG PO TABS
10.0000 mg | ORAL_TABLET | Freq: Once | ORAL | Status: AC
  Administered 2017-07-30: 10 mg via ORAL

## 2017-07-30 MED ORDER — SODIUM CHLORIDE 0.9 % IV SOLN: Freq: Once | INTRAVENOUS | Status: DC

## 2017-07-30 MED ORDER — PROCHLORPERAZINE MALEATE 10 MG PO TABS
ORAL_TABLET | ORAL | Status: AC
  Filled 2017-07-30: qty 1

## 2017-07-30 MED ORDER — DEXAMETHASONE 4 MG PO TABS: ORAL_TABLET | ORAL | 3 refills | Status: DC

## 2017-07-30 MED ORDER — BORTEZOMIB CHEMO IV INJECTION 3.5 MG: 1.3000 mg/m2 | Freq: Once | INTRAMUSCULAR | Status: DC

## 2017-07-30 MED ORDER — BORTEZOMIB CHEMO SQ INJECTION 3.5 MG (2.5MG/ML)
1.3000 mg/m2 | Freq: Once | INTRAMUSCULAR | Status: AC
  Administered 2017-07-30: 2.25 mg via SUBCUTANEOUS
  Filled 2017-07-30: qty 2.25

## 2017-07-30 NOTE — Patient Instructions (Addendum)
[facility] Discharge Instructions for Patients Receiving Chemotherapy  Today you received the following chemotherapy agent: Velcade.  To help prevent nausea and vomiting after your treatment, we encourage you to take your nausea medication as directed.  PLEASE TAKE ONE ZOFRAN TABLET TONIGHT BEFORE BED AND THEN YOU CAN TAKE IT AS NEEDED FOLLOWING DIRECTIONS ON THE BOTTLE THE REST OF THE TIME.    If you develop nausea and vomiting that is not controlled by your nausea medication, call the clinic.   BELOW ARE SYMPTOMS THAT SHOULD BE REPORTED IMMEDIATELY:  *FEVER GREATER THAN 100.5 F  *CHILLS WITH OR WITHOUT FEVER  NAUSEA AND VOMITING THAT IS NOT CONTROLLED WITH YOUR NAUSEA MEDICATION  *UNUSUAL SHORTNESS OF BREATH  *UNUSUAL BRUISING OR BLEEDING  TENDERNESS IN MOUTH AND THROAT WITH OR WITHOUT PRESENCE OF ULCERS  *URINARY PROBLEMS  *BOWEL PROBLEMS  UNUSUAL RASH Items with * indicate a potential emergency and should be followed up as soon as possible.  Feel free to call the clinic you have any questions or concerns. The clinic phone number is [phone].  Please show the [facility] at check-in to the Emergency Department and triage nurse.   Bortezomib injection What is this medicine? BORTEZOMIB (bor TEZ oh mib) is a medicine that targets proteins in cancer cells and stops the cancer cells from growing. It is used to treat multiple myeloma and mantle-cell lymphoma. This medicine may be used for other purposes; ask your health care provider or pharmacist if you have questions. COMMON BRAND NAME(S): Velcade What should I tell my health care provider before I take this medicine? They need to know if you have any of these conditions: -diabetes -heart disease -irregular heartbeat -liver disease -on hemodialysis -low blood counts, like low white blood cells, platelets, or hemoglobin -peripheral neuropathy -taking medicine for blood pressure -an unusual or  allergic reaction to bortezomib, mannitol, boron, other medicines, foods, dyes, or preservatives -pregnant or trying to get pregnant -breast-feeding How should I use this medicine? This medicine is for injection into a vein or for injection under the skin. It is given by a health care professional in a hospital or clinic setting. Talk to your pediatrician regarding the use of this medicine in children. Special care may be needed. Overdosage: If you think you have taken too much of this medicine contact a poison control center or emergency room at once. NOTE: This medicine is only for you. Do not share this medicine with others. What if I miss a dose? It is important not to miss your dose. Call your doctor or health care professional if you are unable to keep an appointment. What may interact with this medicine? This medicine may interact with the following medications: -ketoconazole -rifampin -ritonavir -St. John's Wort This list may not describe all possible interactions. Give your health care provider a list of all the medicines, herbs, non-prescription drugs, or dietary supplements you use. Also tell them if you smoke, drink alcohol, or use illegal drugs. Some items may interact with your medicine. What should I watch for while using this medicine? You may get drowsy or dizzy. Do not drive, use machinery, or do anything that needs mental alertness until you know how this medicine affects you. Do not stand or sit up quickly, especially if you are an older patient. This reduces the risk of dizzy or fainting spells. In some cases, you may be given additional medicines to help with side effects. Follow all directions for their use. Call  your doctor or health care professional for advice if you get a fever, chills or sore throat, or other symptoms of a cold or flu. Do not treat yourself. This drug decreases your body's ability to fight infections. Try to avoid being around people who are  sick. This medicine may increase your risk to bruise or bleed. Call your doctor or health care professional if you notice any unusual bleeding. You may need blood work done while you are taking this medicine. In some patients, this medicine may cause a serious brain infection that may cause death. If you have any problems seeing, thinking, speaking, walking, or standing, tell your doctor right away. If you cannot reach your doctor, urgently seek other source of medical care. Check with your doctor or health care professional if you get an attack of severe diarrhea, nausea and vomiting, or if you sweat a lot. The loss of too much body fluid can make it dangerous for you to take this medicine. Do not become pregnant while taking this medicine or for at least 2 months after stopping it. Women should inform their doctor if they wish to become pregnant or think they might be pregnant. Men should not father a child while taking this medicine and for at least 2 months after stopping it. There is a potential for serious side effects to an unborn child. Talk to your health care professional or pharmacist for more information. Do not breast-feed an infant while taking this medicine or for 2 months after stopping it. This medicine may interfere with the ability to have a child. You should talk with your doctor or health care professional if you are concerned about your fertility. What side effects may I notice from receiving this medicine? Side effects that you should report to your doctor or health care professional as soon as possible: -allergic reactions like skin rash, itching or hives, swelling of the face, lips, or tongue -breathing problems -changes in hearing -changes in vision -fast, irregular heartbeat -feeling faint or lightheaded, falls -pain, tingling, numbness in the hands or feet -right upper belly pain -seizures -swelling of the ankles, feet, hands -unusual bleeding or bruising -unusually  weak or tired -vomiting -yellowing of the eyes or skin Side effects that usually do not require medical attention (report to your doctor or health care professional if they continue or are bothersome): -changes in emotions or moods -constipation -diarrhea -loss of appetite -headache -irritation at site where injected -nausea This list may not describe all possible side effects. Call your doctor for medical advice about side effects. You may report side effects to FDA at 1-800-FDA-1088. Where should I keep my medicine? This drug is given in a hospital or clinic and will not be stored at home. NOTE: This sheet is a summary. It may not cover all possible information. If you have questions about this medicine, talk to your doctor, pharmacist, or health care provider.  2018 Elsevier/Gold Standard (2016-08-14 15:53:51)

## 2017-08-03 ENCOUNTER — Emergency Department (HOSPITAL_COMMUNITY): Payer: Medicare Other

## 2017-08-03 ENCOUNTER — Inpatient Hospital Stay (HOSPITAL_COMMUNITY)
Admission: EM | Admit: 2017-08-03 | Discharge: 2017-08-07 | DRG: 194 | Disposition: A | Discharge status: Home / Self Care | Payer: Medicare Other | Attending: Internal Medicine | Admitting: Internal Medicine

## 2017-08-03 ENCOUNTER — Encounter (HOSPITAL_COMMUNITY): Payer: Self-pay | Admitting: *Deleted

## 2017-08-03 ENCOUNTER — Other Ambulatory Visit: Payer: Self-pay

## 2017-08-03 DIAGNOSIS — I272 Pulmonary hypertension, unspecified: Secondary | ICD-10-CM | POA: Diagnosis present

## 2017-08-03 DIAGNOSIS — D539 Nutritional anemia, unspecified: Secondary | ICD-10-CM | POA: Diagnosis present

## 2017-08-03 DIAGNOSIS — R0602 Shortness of breath: Secondary | ICD-10-CM | POA: Diagnosis not present

## 2017-08-03 DIAGNOSIS — Z91012 Allergy to eggs: Secondary | ICD-10-CM | POA: Diagnosis not present

## 2017-08-03 DIAGNOSIS — J189 Pneumonia, unspecified organism: Secondary | ICD-10-CM | POA: Diagnosis not present

## 2017-08-03 DIAGNOSIS — F329 Major depressive disorder, single episode, unspecified: Secondary | ICD-10-CM | POA: Diagnosis present

## 2017-08-03 DIAGNOSIS — E440 Moderate protein-calorie malnutrition: Secondary | ICD-10-CM | POA: Diagnosis present

## 2017-08-03 DIAGNOSIS — I77810 Thoracic aortic ectasia: Secondary | ICD-10-CM | POA: Diagnosis present

## 2017-08-03 DIAGNOSIS — C9000 Multiple myeloma not having achieved remission: Secondary | ICD-10-CM | POA: Diagnosis present

## 2017-08-03 DIAGNOSIS — E876 Hypokalemia: Secondary | ICD-10-CM | POA: Diagnosis present

## 2017-08-03 DIAGNOSIS — R0902 Hypoxemia: Secondary | ICD-10-CM | POA: Diagnosis not present

## 2017-08-03 DIAGNOSIS — Y95 Nosocomial condition: Secondary | ICD-10-CM | POA: Diagnosis present

## 2017-08-03 DIAGNOSIS — Z7982 Long term (current) use of aspirin: Secondary | ICD-10-CM | POA: Diagnosis not present

## 2017-08-03 DIAGNOSIS — J181 Lobar pneumonia, unspecified organism: Secondary | ICD-10-CM

## 2017-08-03 DIAGNOSIS — I10 Essential (primary) hypertension: Secondary | ICD-10-CM | POA: Diagnosis not present

## 2017-08-03 DIAGNOSIS — Z825 Family history of asthma and other chronic lower respiratory diseases: Secondary | ICD-10-CM

## 2017-08-03 DIAGNOSIS — N183 Chronic kidney disease, stage 3 unspecified: Secondary | ICD-10-CM | POA: Diagnosis present

## 2017-08-03 DIAGNOSIS — K219 Gastro-esophageal reflux disease without esophagitis: Secondary | ICD-10-CM | POA: Diagnosis present

## 2017-08-03 DIAGNOSIS — E871 Hypo-osmolality and hyponatremia: Secondary | ICD-10-CM | POA: Diagnosis not present

## 2017-08-03 DIAGNOSIS — F419 Anxiety disorder, unspecified: Secondary | ICD-10-CM | POA: Diagnosis present

## 2017-08-03 DIAGNOSIS — N400 Enlarged prostate without lower urinary tract symptoms: Secondary | ICD-10-CM | POA: Diagnosis not present

## 2017-08-03 DIAGNOSIS — Z66 Do not resuscitate: Secondary | ICD-10-CM | POA: Diagnosis present

## 2017-08-03 DIAGNOSIS — J440 Chronic obstructive pulmonary disease with acute lower respiratory infection: Secondary | ICD-10-CM | POA: Diagnosis present

## 2017-08-03 DIAGNOSIS — Z887 Allergy status to serum and vaccine status: Secondary | ICD-10-CM

## 2017-08-03 DIAGNOSIS — D649 Anemia, unspecified: Secondary | ICD-10-CM

## 2017-08-03 DIAGNOSIS — Z6822 Body mass index (BMI) 22.0-22.9, adult: Secondary | ICD-10-CM

## 2017-08-03 DIAGNOSIS — I129 Hypertensive chronic kidney disease with stage 1 through stage 4 chronic kidney disease, or unspecified chronic kidney disease: Secondary | ICD-10-CM | POA: Diagnosis not present

## 2017-08-03 DIAGNOSIS — R0600 Dyspnea, unspecified: Secondary | ICD-10-CM

## 2017-08-03 DIAGNOSIS — E785 Hyperlipidemia, unspecified: Secondary | ICD-10-CM | POA: Diagnosis present

## 2017-08-03 DIAGNOSIS — Z881 Allergy status to other antibiotic agents status: Secondary | ICD-10-CM

## 2017-08-03 DIAGNOSIS — L821 Other seborrheic keratosis: Secondary | ICD-10-CM | POA: Diagnosis not present

## 2017-08-03 LAB — COMPREHENSIVE METABOLIC PANEL
ALBUMIN: 2.8 g/dL — AB (ref 3.5–5.0)
ALT: 12 U/L — ABNORMAL LOW (ref 17–63)
ANION GAP: 9 (ref 5–15)
AST: 18 U/L (ref 15–41)
Alkaline Phosphatase: 56 U/L (ref 38–126)
BILIRUBIN TOTAL: 0.8 mg/dL (ref 0.3–1.2)
BUN: 19 mg/dL (ref 6–20)
CHLORIDE: 100 mmol/L — AB (ref 101–111)
CO2: 23 mmol/L (ref 22–32)
Calcium: 8.5 mg/dL — ABNORMAL LOW (ref 8.9–10.3)
Creatinine, Ser: 1.3 mg/dL — ABNORMAL HIGH (ref 0.61–1.24)
GFR calc Af Amer: 56 mL/min — ABNORMAL LOW (ref >60)
GFR calc non Af Amer: 48 mL/min — ABNORMAL LOW (ref >60)
GLUCOSE: 161 mg/dL — AB (ref 65–99)
POTASSIUM: 3.4 mmol/L — AB (ref 3.5–5.1)
SODIUM: 132 mmol/L — AB (ref 135–145)
TOTAL PROTEIN: 9.4 g/dL — AB (ref 6.5–8.1)

## 2017-08-03 LAB — CBC WITH DIFFERENTIAL/PLATELET
BASOS ABS: 0 10*3/uL (ref 0.0–0.1)
Basophils Relative: 0 %
Eosinophils Absolute: 0 10*3/uL (ref 0.0–0.7)
Eosinophils Relative: 0 %
HEMATOCRIT: 23.3 % — AB (ref 39.0–52.0)
Hemoglobin: 7.8 g/dL — ABNORMAL LOW (ref 13.0–17.0)
LYMPHS ABS: 0.4 10*3/uL — AB (ref 0.7–4.0)
LYMPHS PCT: 6 %
MCH: 34.1 pg — AB (ref 26.0–34.0)
MCHC: 33.5 g/dL (ref 30.0–36.0)
MCV: 101.7 fL — AB (ref 78.0–100.0)
MONO ABS: 0.3 10*3/uL (ref 0.1–1.0)
Monocytes Relative: 5 %
NEUTROS ABS: 5.9 10*3/uL (ref 1.7–7.7)
Neutrophils Relative %: 89 %
Platelets: 100 10*3/uL — ABNORMAL LOW (ref 150–400)
RBC: 2.29 MIL/uL — ABNORMAL LOW (ref 4.22–5.81)
RDW: 14.8 % (ref 11.5–15.5)
WBC: 6.6 10*3/uL (ref 4.0–10.5)

## 2017-08-03 LAB — I-STAT TROPONIN, ED: TROPONIN I, POC: 0.02 ng/mL (ref 0.00–0.08)

## 2017-08-03 LAB — TROPONIN I

## 2017-08-03 LAB — BRAIN NATRIURETIC PEPTIDE: B NATRIURETIC PEPTIDE 5: 819.6 pg/mL — AB (ref 0.0–100.0)

## 2017-08-03 MED ORDER — IOPAMIDOL (ISOVUE-370) INJECTION 76%
100.0000 mL | Freq: Once | INTRAVENOUS | Status: AC | PRN
  Administered 2017-08-03: 100 mL via INTRAVENOUS

## 2017-08-03 MED ORDER — DEXTROSE 5 % IV SOLN
1.0000 g | Freq: Once | INTRAVENOUS | Status: AC
  Administered 2017-08-03: 1 g via INTRAVENOUS
  Filled 2017-08-03: qty 10

## 2017-08-03 MED ORDER — IOPAMIDOL (ISOVUE-370) INJECTION 76%
INTRAVENOUS | Status: AC
  Filled 2017-08-03: qty 100

## 2017-08-03 MED ORDER — IPRATROPIUM-ALBUTEROL 0.5-2.5 (3) MG/3ML IN SOLN
3.0000 mL | Freq: Once | RESPIRATORY_TRACT | Status: AC
  Administered 2017-08-03: 3 mL via RESPIRATORY_TRACT
  Filled 2017-08-03: qty 3

## 2017-08-03 MED ORDER — FUROSEMIDE 10 MG/ML IJ SOLN
40.0000 mg | INTRAMUSCULAR | Status: AC
  Administered 2017-08-03: 40 mg via INTRAVENOUS
  Filled 2017-08-03: qty 4

## 2017-08-03 MED ORDER — POTASSIUM CHLORIDE CRYS ER 20 MEQ PO TBCR
40.0000 meq | EXTENDED_RELEASE_TABLET | Freq: Once | ORAL | Status: AC
  Administered 2017-08-04: 40 meq via ORAL
  Filled 2017-08-03: qty 2

## 2017-08-03 MED ORDER — AZITHROMYCIN 500 MG IV SOLR
500.0000 mg | Freq: Once | INTRAVENOUS | Status: AC
  Administered 2017-08-03: 500 mg via INTRAVENOUS
  Filled 2017-08-03: qty 500

## 2017-08-03 NOTE — H&P (Signed)
Peter Wolfe RPR:[id number] DOB: 1929/02/26 DOA: 08/03/2017     PCP: [name], NP   Outpatient Specialists: [facility] Patient coming from:   home Lives alone,      Chief Complaint: Shortness of breath  HPI: Peter Wolfe is a 83 y.o. male with medical history significant of multiple myeloma on chemo, COPD, CKD who p/w shortness of breath    Presented with worsening shortness of breath and some wheezing and cough productive of clear sputum denies associated fevers or chest pain nausea vomiting and diarrhea last chemotherapy was 4 days ago he has known history of COPD but states this is usually stable  He only had one chemo therapy infusion on 11/1 he received Velcade Regarding pertinent Chronic problems:  Denies hx of CAD, no a diabetic.    IN ER:  Temp (24hrs), Avg:98.5 F (36.9 C), Min:98.2 F (36.8 C), Max:98.7 F (37.1 C)      on arrival  ED Triage Vitals  Enc Vitals Group     BP 08/03/17 1315 (!) 186/82     Pulse Rate 08/03/17 1315 95     Resp 08/03/17 1315 20     Temp 08/03/17 1315 98.2 F (36.8 C)     Temp Source 08/03/17 1315 Oral     SpO2 08/03/17 1313 99 %     Weight --      Height --      Head Circumference --      Peak Flow --      Pain Score --      Pain Loc --      Pain Edu? --      Excl. in [place]? --     Latest RR 16 96% Hr 78 BP 129/75 Trop 0.02 Na 132 K 3.4 Cr 1.3 Prot 9.4 Alb 2.8 Wbc 6.6 Hg 7.8 down from 9.2 4 days ago plt 100 BNP 819 Trop <0.03 CT chest: Bilateral PNA no PE  Following Medications were ordered in ER: Medications  iopamidol (ISOVUE-370) 76 % injection (not administered)  cefTRIAXone (ROCEPHIN) 1 g in dextrose 5 % 50 mL IVPB (1 g Intravenous New Bag/Given 08/03/17 2006)  azithromycin (ZITHROMAX) 500 mg in dextrose 5 % 250 mL IVPB (not administered)  ipratropium-albuterol (DUONEB) 0.5-2.5 (3) MG/3ML nebulizer solution 3 mL (3 mLs Nebulization Given 08/03/17 1812)  furosemide (LASIX)  injection 40 mg (40 mg Intravenous Given 08/03/17 1813)  iopamidol (ISOVUE-370) 76 % injection 100 mL (100 mLs Intravenous Contrast Given 08/03/17 1859)     Hospitalist was called for admission for H In a setting of active chemotherapy  Review of Systems:    Pertinent positives include: fatigue, shortness of breath at rest,dyspnea on exertion, excess mucus,  productive cough,  Constitutional:  No weight loss, night sweats, Fevers, chills,  weight loss  HEENT:  No headaches, Difficulty swallowing,Tooth/dental problems,Sore throat,  No sneezing, itching, ear ache, nasal congestion, post nasal drip,  Cardio-vascular:  No chest pain, Orthopnea, PND, anasarca, dizziness, palpitations.no Bilateral lower extremity swelling  GI:  No heartburn, indigestion, abdominal pain, nausea, vomiting, diarrhea, change in bowel habits, loss of appetite, melena, blood in stool, hematemesis Resp:   No non-productive cough, No coughing up of blood.No change in color of mucus.No wheezing. Skin:  no rash or lesions. No jaundice GU:  no dysuria, change in color of urine, no urgency or frequency. No straining to urinate.  No flank pain.  Musculoskeletal:  No joint pain or no  joint swelling. No decreased range of motion. No back pain.  Psych:  No change in mood or affect. No depression or anxiety. No memory loss.  Neuro: no localizing neurological complaints, no tingling, no weakness, no double vision, no gait abnormality, no slurred speech, no confusion  As per HPI otherwise 10 point review of systems negative.   Past Medical History: Past Medical History:  Diagnosis Date  . BPH (benign prostatic hyperplasia)   . Broken rib   . COPD (chronic obstructive pulmonary disease) ([place])   . GERD (gastroesophageal reflux disease)   . GERD (gastroesophageal reflux disease)   . Hyperlipidemia   . Hypertension   . Insomnia   . Panic attack    Past Surgical History:  Procedure Laterality Date  . CHOLECYSTECTOMY        Social History:  Ambulatory   Gilford Rile    reports that  has never smoked. he has never used smokeless tobacco. He reports that he drinks alcohol. He reports that he does not use drugs.  Allergies:   Allergies  Allergen Reactions  . Influenza Virus Vacc Split Pf Other (See Comments)    Allergic to eggs  . Doxycycline Rash  . Eggs Or Egg-Derived Products Rash       Family History:   Family History  Problem Relation Age of Onset  . Emphysema Father   . Alcohol abuse Father   . Diabetes Sister   . Alzheimer's disease Sister     Medications: Prior to Admission medications   Medication Sig Start Date End Date Taking? Authorizing Provider  acyclovir (ZOVIRAX) 400 MG tablet Take 400 mg 2 (two) times daily by mouth. 07/28/17  Yes [provider]  aspirin 325 MG tablet Take 650 mg daily as needed by mouth (("BREATHING")).    Yes [provider]  busPIRone (BUSPAR) 30 MG tablet TAKE 1 TABLET DAILY 07/29/16  Yes Dorena Cookey, MD  cloNIDine (CATAPRES) 0.1 MG tablet TAKE 1 TABLET DAILY 07/29/16  Yes Dorena Cookey, MD  FLUoxetine (PROZAC) 40 MG capsule TAKE ONE CAPSULE BY MOUTH ONCE DAILY 04/14/17  Yes Nafziger, Tommi Rumps, NP  mirtazapine (REMERON) 45 MG tablet TAKE ONE TABLET BY MOUTH AT BEDTIME 05/12/17  Yes Nafziger, Tommi Rumps, NP  Multiple Vitamin (MULTIVITAMIN) capsule Take 1 capsule by mouth daily.   Yes [provider]  omeprazole (PRILOSEC) 40 MG capsule Take 1 capsule (40 mg total) by mouth daily. 01/05/17  Yes Domenic Polite, MD  ondansetron (ZOFRAN) 8 MG tablet Take 8 mg every 8 (eight) hours as needed by mouth. NAUSEA AND VOMITING 07/13/17  Yes [provider]  prochlorperazine (COMPAZINE) 10 MG tablet Take 10 mg daily as needed by mouth. NAUSEA AND VOMITING 07/13/17  Yes [provider]  simvastatin (ZOCOR) 10 MG tablet TAKE 1 TABLET AT BEDTIME 07/29/16  Yes Dorena Cookey, MD  tamsulosin (FLOMAX) 0.4 MG CAPS capsule TAKE 1 CAPSULE  DAILY 07/29/16  Yes Dorena Cookey, MD  traZODone (DESYREL) 50 MG tablet Take 50 mg by mouth at bedtime.   Yes [provider]  dexamethasone (DECADRON) 4 MG tablet Take 2.5 tablets (10 mg) on Tue/Fri every week Patient not taking: Reported on 08/03/2017 07/30/17   Ardath Sax, MD  morphine (MSIR) 15 MG tablet Take 1 tablet (15 mg total) by mouth every 4 (four) hours as needed for severe pain. Patient not taking: Reported on 08/03/2017 07/27/17   Ardath Sax, MD    Physical Exam: Patient Vitals for the  past 24 hrs:  BP Temp Temp src Pulse Resp SpO2  08/03/17 1749 129/75 98.7 F (37.1 C) Oral 78 16 96 %  08/03/17 1315 (!) 186/82 98.2 F (36.8 C) Oral 95 20 95 %  08/03/17 1313 - - - - - 99 %    1. General:  in No Acute distress  Chronically ill    -appearing 2. Psychological: Alert and Oriented 3. Head/ENT:     Dry Mucous Membranes                          Head Non traumatic, neck supple                           Poor Dentition 4. SKIN:  decreased Skin turgor,  Skin clean Dry and intact no rash 5. Heart: Regular rate and rhythm no  Murmur, no Rub or gallop 6. Lungs:  no wheezes some crackles   7. Abdomen: Soft,  non-tender, Non distended     8. Lower extremities: no clubbing, cyanosis, or edema 9. Neurologically Grossly intact, moving all 4 extremities equally    10. MSK: Normal range of motion   body mass index is unknown because there is no height or weight on file.  Labs on Admission:   Labs on Admission: I have personally reviewed following labs and imaging studies  CBC: Recent Labs  Lab 07/30/17 0808 08/03/17 1531  WBC 4.1 6.6  NEUTROABS 2.7 5.9  HGB 9.2* 7.8*  HCT 27.5* 23.3*  MCV 103.0* 101.7*  PLT 114* 829*   Basic Metabolic Panel: Recent Labs  Lab 07/30/17 0808 08/03/17 1531  NA 139 132*  K 3.8 3.4*  CL  --  100*  CO2 24 23  GLUCOSE 163* 161*  BUN 20.7 19  CREATININE 1.3 1.30*  CALCIUM 8.7 8.5*  MG 1.7  --    GFR: Estimated  Creatinine Clearance: 35.5 mL/min (A) (by C-G formula based on SCr of 1.3 mg/dL (H)). Liver Function Tests: Recent Labs  Lab 07/30/17 0808 08/03/17 1531  AST 14 18  ALT 10 12*  ALKPHOS 46 56  BILITOT 0.88 0.8  PROT 9.6* 9.4*  ALBUMIN 2.9* 2.8*   No results for input(s): LIPASE, AMYLASE in the last 168 hours. No results for input(s): AMMONIA in the last 168 hours. Coagulation Profile: No results for input(s): INR, PROTIME in the last 168 hours. Cardiac Enzymes: Recent Labs  Lab 08/03/17 1531  TROPONINI <0.03   BNP (last 3 results) No results for input(s): PROBNP in the last 8760 hours. HbA1C: No results for input(s): HGBA1C in the last 72 hours. CBG: No results for input(s): GLUCAP in the last 168 hours. Lipid Profile: No results for input(s): CHOL, HDL, LDLCALC, TRIG, CHOLHDL, LDLDIRECT in the last 72 hours. Thyroid Function Tests: No results for input(s): TSH, T4TOTAL, FREET4, T3FREE, THYROIDAB in the last 72 hours. Anemia Panel: No results for input(s): VITAMINB12, FOLATE, FERRITIN, TIBC, IRON, RETICCTPCT in the last 72 hours. Urine analysis: Sepsis Labs: _0 (procalcitonin:4,lacticidven:4) )No results found for this or any previous visit (from the past 240 hour(s)).    UA  ordered  No results found for: HGBA1C  Estimated Creatinine Clearance: 35.5 mL/min (A) (by C-G formula based on SCr of 1.3 mg/dL (H)).  BNP (last 3 results) No results for input(s): PROBNP in the last 8760 hours.   ECG REPORT  Independently reviewed Rate: 95  Rhythm: NSR ST&T Change: No  acute ischemic changes   QTC 482  There were no vitals filed for this visit.   Cultures:    Component Value Date/Time   SDES URINE, RANDOM 12/31/2016 1837   SPECREQUEST NONE 12/31/2016 1837   CULT NO GROWTH 12/31/2016 1837   REPTSTATUS 01/02/2017 FINAL 12/31/2016 1837     Radiological Exams on Admission: Dg Chest 2 View  Result Date: 08/03/2017 CLINICAL DATA:  Shortness of breath 5 days  severe this morning. History multiple myeloma. EXAM: CHEST  2 VIEW COMPARISON:  12/31/2016. FINDINGS: Lungs are adequately inflated with mild opacification of the medial lung bases which may be due to vascular crowding although cannot exclude atelectasis/ infection. Small amount of pleural fluid posteriorly on the lateral film. Evidence of patient's moderate size hiatal hernia. Cardiomediastinal silhouette is within normal. There is calcified plaque over the aortic arch. Known severe T9 compression fracture. IMPRESSION: Mild opacification over the medial lung bases which may be due to vascular crowding versus atelectasis or infection. Small amount of pleural fluid posteriorly on the lateral film. Moderate size hiatal hernia. Known T9 compression fracture. Electronically Signed   By: Marin Olp M.D.   On: 08/03/2017 15:20   Ct Angio Chest Pe W/cm &/or Wo Cm  Result Date: 08/03/2017 CLINICAL DATA:  83 year old who is currently undergoing chemotherapy for multiple myeloma, presenting with acute onset of shortness of breath, productive cough and wheezing that began after chemotherapy 4 days ago. Current history of COPD. EXAM: CT ANGIOGRAPHY CHEST WITH CONTRAST TECHNIQUE: Multidetector CT imaging of the chest was performed using the standard protocol during bolus administration of intravenous contrast. Multiplanar CT image reconstructions and MIPs were obtained to evaluate the vascular anatomy. CONTRAST:  100 mL Isovue 370 IV. COMPARISON:  PET-CT 07/20/2017.  No prior chest CT. FINDINGS: Cardiovascular: Contrast opacification of pulmonary arteries is very good. No filling defects within either main pulmonary artery or their segmental branches in either lung to suggest pulmonary embolism. Heart mildly enlarged. Moderate-to-marked three-vessel coronary atherosclerosis. Mild aortic valvular calcification. Mild aortic annular calcification. Moderate to severe atherosclerosis involving the thoracic and upper abdominal  aorta. Ectatic ascending thoracic aorta with maximum diameter approximating 4.0 cm. Mediastinum/Nodes: No pathologic mediastinal, hilar or axillary lymphadenopathy. Normal appearing gas-filled esophagus. Large hiatal hernia with greater than 3/4 of the stomach located within the chest. Small bilateral thyroid gland nodules, the largest approximately 8 mm arising from the lower pole of the left lobe. Lungs/Pleura: Emphysematous changes throughout both lungs. Airspace consolidation involving the lower lobes bilaterally with air bronchograms. Small to moderate-sized bilateral pleural effusions. Mild interstitial opacities with interlobular septal thickening. Scattered areas of focal hyperlucency throughout both lungs. Central airways patent with mild bronchial wall thickening. Upper Abdomen: Visualized pancreas atrophic. Visualized upper abdomen otherwise unremarkable for the early arterial phase of enhancement. Musculoskeletal: Severe compression fracture involving the T9 vertebral body, unchanged. Remote healed fracture involving these mid sternal body. No acute fractures. Generalized osseous demineralization. Review of the MIP images confirms the above findings. IMPRESSION: 1. No evidence of pulmonary embolism. 2. Bilateral lower lobe pneumonia (favored over atelectasis) superimposed upon acute bronchitis and/or asthma. 3. Small to moderate-sized bilateral pleural effusions. 4. Very large hiatal hernia. 5. Moderate to marked three-vessel coronary atherosclerosis. 6. Ectatic ascending thoracic aorta with maximum diameter 4.0 cm. Recommend annual imaging followup by CTA or MRA. This recommendation follows 2010 ACCF/AHA/AATS/ACR/ASA/SCA/SCAI/SIR/STS/SVM Guidelines for the Diagnosis and Management of Patients with Thoracic Aortic Disease. Circulation. 2010; 121: K088-P103. Aortic Atherosclerosis (ICD10-I70.0). Emphysema (ICD10-J43.9). Aortic aneurysm NOS (ICD10-I71.9). Electronically  Signed   By: Evangeline Dakin M.D.    On: 08/03/2017 19:50    Chart has been reviewed    Assessment/Plan   83 y.o. male with medical history significant of multiple myeloma on chemo, COPD, CKD who p/w shortness of breath  Admitted for bilateral pneumonia  Present on Admission: . HCAP (healthcare-associated pneumonia) given recent chemotherapy will treat broadly with results of sputum culture patient currently improving  . CKD (chronic kidney disease), stage Wol[place] (HCC) -stable avoid nephrotoxic medications . Essential hypertension, benign - current stable continue home medications . Multiple myeloma not having achieved remission [facility]) Notify oncology the patient has been admitted patient status post recent chemotherapy . Symptomatic anemia - obtain type and screen most likely worsening anemia secondary to recent chemotherapy. He continues to go down below 7 or the patient becomes more symptomatic will transfuse   Dyspnea - currently improved patient received Lasix in the emergency department as well as nebulizer treatment given some response to Lasix and given severity of shortness of breath will obtain echogram to evaluate for any potential CHF Low potassium replete and recheck in a.m. check magnesium level Other plan as per orders.  DVT prophylaxis:   SCD   Code Status:    DNR/DNI  as per patient   Family Communication:   Family not  at  Bedside    Disposition Plan:       To home once workup is complete and patient is stable                            Consults called:  Will make Oncology aware that patient has been admitted   Admission status:   inpatient       Level of care     medical floor            I have spent a total of 56 min on this admission    Luevenia Mcavoy 08/03/2017, 11:33 PM    [facility]  Pager [phone]   after 2 AM please page floor coverage PA If 7AM-7PM, please contact the day team taking care of the patient  [email or web address]  Password [email or web address]

## 2017-08-03 NOTE — ED Notes (Signed)
Unsuccessful blood draw attempt Rt hand.

## 2017-08-03 NOTE — ED Provider Notes (Signed)
Pt signed out by Dr. Rex Kras pending CT results.  Pt does have bilateral pneumonia and bilateral pleural effusions on CT.  No PE.  Pt placed on oxygen and was given IV rocephin and zithromax.  Pt d/w Dr. Roel Cluck ([facility]) for admission.   Isla Pence, MD 08/03/17 2017

## 2017-08-03 NOTE — ED Notes (Signed)
Bed: WA21 Expected date:  Expected time:  Means of arrival:  Comments: 83 yo m sob

## 2017-08-03 NOTE — ED Notes (Signed)
RN will collect labs from IV

## 2017-08-03 NOTE — ED Provider Notes (Signed)
[facility] DEPT Provider Note   CSN: [id number] Arrival date & time: 08/03/17  1302     History   Chief Complaint Chief Complaint  Patient presents with  . Shortness of Breath    HPI NATION CRADLE III is a 83 y.o. male.  83yo M w/ PMH including multiple myeloma on chemo, COPD, CKD who p/w shortness of breath. He had first chemo session 4 days ago and after then he began having shortness of breath associated with wheezing. He reports cough productive of clear phlegm. No chest pain, fevers, sore throat, vomiting, or diarrhea. He no longer has albuterol at home but states his COPD has not caused him problems for years. No hx of blood clots.   The history is provided by the patient.    Past Medical History:  Diagnosis Date  . BPH (benign prostatic hyperplasia)   . Broken rib   . COPD (chronic obstructive pulmonary disease) ([place])   . GERD (gastroesophageal reflux disease)   . GERD (gastroesophageal reflux disease)   . Hyperlipidemia   . Hypertension   . Insomnia   . Panic attack     Patient Active Problem List   Diagnosis Date Noted  . Multiple myeloma not having achieved remission ([place]) 06/25/2017  . Vertebral compression fracture ([place]) 03/24/2017  . BPH (benign prostatic hyperplasia) 02/23/2017  . Anxiety 01/10/2017  . Hyponatremia 12/31/2016  . Hypokalemia 12/31/2016  . Fall 12/29/2016  . Closed fracture of posterior thoracic vertebral body, with routine healing, subsequent encounter 12/29/2016  . Back pain 12/29/2016  . CKD (chronic kidney disease), stage III ([place]) 12/29/2016  . Depression 06/03/2016  . History of fractured rib 11/28/2015  . History of peripheral edema 11/28/2015  . Seborrheic keratosis, inflamed 08/13/2015  . Actinic keratosis 08/13/2015  . Essential hypertension, benign 07/31/2015  . URTICARIA 11/13/2007  . Hyperlipidemia 06/08/2007  . GERD 06/08/2007  . PANIC ATTACK 05/07/2007  . COPD 05/07/2007     Past Surgical History:  Procedure Laterality Date  . CHOLECYSTECTOMY         Home Medications    Prior to Admission medications   Medication Sig Start Date End Date Taking? Authorizing Provider  acyclovir (ZOVIRAX) 400 MG tablet Take 400 mg 2 (two) times daily by mouth. 07/28/17  Yes [provider]  aspirin 325 MG tablet Take 650 mg daily as needed by mouth (("BREATHING")).    Yes [provider]  busPIRone (BUSPAR) 30 MG tablet TAKE 1 TABLET DAILY 07/29/16  Yes Dorena Cookey, MD  cloNIDine (CATAPRES) 0.1 MG tablet TAKE 1 TABLET DAILY 07/29/16  Yes Dorena Cookey, MD  FLUoxetine (PROZAC) 40 MG capsule TAKE ONE CAPSULE BY MOUTH ONCE DAILY 04/14/17  Yes Nafziger, Tommi Rumps, NP  mirtazapine (REMERON) 45 MG tablet TAKE ONE TABLET BY MOUTH AT BEDTIME 05/12/17  Yes Nafziger, Tommi Rumps, NP  Multiple Vitamin (MULTIVITAMIN) capsule Take 1 capsule by mouth daily.   Yes [provider]  omeprazole (PRILOSEC) 40 MG capsule Take 1 capsule (40 mg total) by mouth daily. 01/05/17  Yes Domenic Polite, MD  ondansetron (ZOFRAN) 8 MG tablet Take 8 mg every 8 (eight) hours as needed by mouth. NAUSEA AND VOMITING 07/13/17  Yes [provider]  prochlorperazine (COMPAZINE) 10 MG tablet Take 10 mg daily as needed by mouth. NAUSEA AND VOMITING 07/13/17  Yes [provider]  simvastatin (ZOCOR) 10 MG tablet TAKE 1 TABLET AT BEDTIME 07/29/16  Yes Dorena Cookey, MD  tamsulosin [facility]) 0.4  MG CAPS capsule TAKE 1 CAPSULE DAILY 07/29/16  Yes Dorena Cookey, MD  traZODone (DESYREL) 50 MG tablet Take 50 mg by mouth at bedtime.   Yes [provider]  dexamethasone (DECADRON) 4 MG tablet Take 2.5 tablets (10 mg) on Tue/Fri every week Patient not taking: Reported on 08/03/2017 07/30/17   Ardath Sax, MD  morphine (MSIR) 15 MG tablet Take 1 tablet (15 mg total) by mouth every 4 (four) hours as needed for severe pain. Patient not taking: Reported on 08/03/2017 07/27/17    Ardath Sax, MD    Family History Family History  Problem Relation Age of Onset  . Emphysema Father   . Alcohol abuse Father   . Diabetes Sister   . Alzheimer's disease Sister     Social History Social History   Tobacco Use  . Smoking status: Never Smoker  . Smokeless tobacco: Never Used  Substance Use Topics  . Alcohol use: Yes    Alcohol/week: 0.0 oz    Comment: occasional  . Drug use: No     Allergies   Influenza virus vacc split pf; Doxycycline; and Eggs or egg-derived products   Review of Systems Review of Systems All other systems reviewed and are negative except that which was mentioned in HPI   Physical Exam Updated Vital Signs BP (!) 186/82 (BP Location: Right Arm)   Pulse 95   Temp 98.2 F (36.8 C) (Oral)   Resp 20   SpO2 95%   Physical Exam  Constitutional: He is oriented to person, place, and time. He appears well-developed. No distress.  Pale, frail, chronically ill appearing man awake and comfortable  HENT:  Head: Normocephalic and atraumatic.  Mouth/Throat: Oropharynx is clear and moist.  Moist mucous membranes  Eyes: Conjunctivae are normal. Pupils are equal, round, and reactive to light.  Neck: Neck supple.  Cardiovascular: Normal rate, regular rhythm and normal heart sounds.  No murmur heard. Pulmonary/Chest:  Very mildly increased WOB, occasional end-expiratory faint wheeze b/l  Abdominal: Soft. Bowel sounds are normal. He exhibits no distension. There is no tenderness.  Musculoskeletal: He exhibits no edema.       Right lower leg: He exhibits no edema.       Left lower leg: He exhibits no edema.  Neurological: He is alert and oriented to person, place, and time.  Fluent speech  Skin: Skin is warm and dry. No rash noted.  Psychiatric: He has a normal mood and affect. Judgment normal.  Nursing note and vitals reviewed.    ED Treatments / Results  Labs (all labs ordered are listed, but only abnormal results are  displayed) Labs Reviewed  COMPREHENSIVE METABOLIC PANEL - Abnormal; Notable for the following components:      Result Value   Sodium 132 (*)    Potassium 3.4 (*)    Chloride 100 (*)    Glucose, Bld 161 (*)    Creatinine, Ser 1.30 (*)    Calcium 8.5 (*)    Total Protein 9.4 (*)    Albumin 2.8 (*)    ALT 12 (*)    GFR calc non Af Amer 48 (*)    GFR calc Af Amer 56 (*)    All other components within normal limits  CBC WITH DIFFERENTIAL/PLATELET - Abnormal; Notable for the following components:   RBC 2.29 (*)    Hemoglobin 7.8 (*)    HCT 23.3 (*)    MCV 101.7 (*)    MCH 34.1 (*)  Platelets 100 (*)    Lymphs Abs 0.4 (*)    All other components within normal limits  BRAIN NATRIURETIC PEPTIDE - Abnormal; Notable for the following components:   B Natriuretic Peptide 819.6 (*)    All other components within normal limits  TROPONIN I  I-STAT TROPONIN, ED    EKG  EKG Interpretation  Date/Time:  Monday August 03 2017 13:15:44 EST Ventricular Rate:  95 PR Interval:    QRS Duration: 86 QT Interval:  383 QTC Calculation: 482 R Axis:   74 Text Interpretation:  Sinus rhythm Ventricular premature complex Borderline repolarization abnormality Borderline prolonged QT interval Baseline wander in lead(s) I II aVR occasional PVC Confirmed by Theotis Burrow [phone]) on 08/03/2017 1:56:54 PM       Radiology Dg Chest 2 View  Result Date: 08/03/2017 CLINICAL DATA:  Shortness of breath 5 days severe this morning. History multiple myeloma. EXAM: CHEST  2 VIEW COMPARISON:  12/31/2016. FINDINGS: Lungs are adequately inflated with mild opacification of the medial lung bases which may be due to vascular crowding although cannot exclude atelectasis/ infection. Small amount of pleural fluid posteriorly on the lateral film. Evidence of patient's moderate size hiatal hernia. Cardiomediastinal silhouette is within normal. There is calcified plaque over the aortic arch. Known severe T9 compression  fracture. IMPRESSION: Mild opacification over the medial lung bases which may be due to vascular crowding versus atelectasis or infection. Small amount of pleural fluid posteriorly on the lateral film. Moderate size hiatal hernia. Known T9 compression fracture. Electronically Signed   By: Marin Olp M.D.   On: 08/03/2017 15:20    Procedures Procedures (including critical care time)  Medications Ordered in ED Medications  ipratropium-albuterol (DUONEB) 0.5-2.5 (3) MG/3ML nebulizer solution 3 mL (not administered)  furosemide (LASIX) injection 40 mg (not administered)     Initial Impression / Assessment and Plan / ED Course  I have reviewed the triage vital signs and the nursing notes.  Pertinent labs & imaging results that were available during my care of the patient were reviewed by me and considered in my medical decision making (see chart for details).     Pt w/ multiple myeloma, recently started on chemotherapy, presents with several days of shortness of breath with cough, no associated chest pain or fevers.  He was nontoxic on exam, O2 saturation mid 90s on room air, hypertensive but the remainder his vital signs were normal.  Gave duoneb.  Had already received albuterol treatment, and Solu-Medrol by EMS.  EKG without acute ischemia. LAbs show negative troponin, Hgb 7.8, Cr 1.3 similar to previous, BNP 819. Gave IV lasix.  Test x-ray shows mild opacification of lung bases.  I have ordered a CTA chest to r/o PE.  I am signing out to the oncoming provider who will follow up on imaging.  Final Clinical Impressions(s) / ED Diagnoses   Final diagnoses:  None    ED Discharge Orders    None       Nicolemarie Wooley, Wenda Overland, MD 08/03/17 1645

## 2017-08-03 NOTE — ED Triage Notes (Signed)
Patient transported by home by [facility]. SHOB x 4 days, recent chemotherapy treatment for bone cancer. Wheezing bilaterally initially with EMS; EMS administered 10 mg of Albuterol/ 0.5 mg Atrovent along with 125 mg of Solu-Medrol IV.

## 2017-08-04 ENCOUNTER — Inpatient Hospital Stay (HOSPITAL_COMMUNITY): Payer: Medicare Other

## 2017-08-04 DIAGNOSIS — J189 Pneumonia, unspecified organism: Principal | ICD-10-CM

## 2017-08-04 DIAGNOSIS — C9000 Multiple myeloma not having achieved remission: Secondary | ICD-10-CM

## 2017-08-04 DIAGNOSIS — R0600 Dyspnea, unspecified: Secondary | ICD-10-CM

## 2017-08-04 LAB — CBC
HCT: 22.7 % — ABNORMAL LOW (ref 39.0–52.0)
Hemoglobin: 7.7 g/dL — ABNORMAL LOW (ref 13.0–17.0)
MCH: 34.2 pg — ABNORMAL HIGH (ref 26.0–34.0)
MCHC: 33.9 g/dL (ref 30.0–36.0)
MCV: 100.9 fL — ABNORMAL HIGH (ref 78.0–100.0)
PLATELETS: 106 10*3/uL — AB (ref 150–400)
RBC: 2.25 MIL/uL — ABNORMAL LOW (ref 4.22–5.81)
RDW: 14.8 % (ref 11.5–15.5)
WBC: 5 10*3/uL (ref 4.0–10.5)

## 2017-08-04 LAB — COMPREHENSIVE METABOLIC PANEL
ALBUMIN: 2.6 g/dL — AB (ref 3.5–5.0)
ALK PHOS: 50 U/L (ref 38–126)
ALT: 12 U/L — ABNORMAL LOW (ref 17–63)
ANION GAP: 10 (ref 5–15)
AST: 16 U/L (ref 15–41)
BILIRUBIN TOTAL: 0.6 mg/dL (ref 0.3–1.2)
BUN: 19 mg/dL (ref 6–20)
CALCIUM: 8.1 mg/dL — AB (ref 8.9–10.3)
CO2: 24 mmol/L (ref 22–32)
Chloride: 97 mmol/L — ABNORMAL LOW (ref 101–111)
Creatinine, Ser: 1.25 mg/dL — ABNORMAL HIGH (ref 0.61–1.24)
GFR calc Af Amer: 58 mL/min — ABNORMAL LOW (ref >60)
GFR calc non Af Amer: 50 mL/min — ABNORMAL LOW (ref >60)
GLUCOSE: 199 mg/dL — AB (ref 65–99)
Potassium: 3.4 mmol/L — ABNORMAL LOW (ref 3.5–5.1)
SODIUM: 131 mmol/L — AB (ref 135–145)
Total Protein: 9.1 g/dL — ABNORMAL HIGH (ref 6.5–8.1)

## 2017-08-04 LAB — TSH: TSH: 0.763 u[IU]/mL (ref 0.350–4.500)

## 2017-08-04 LAB — ECHOCARDIOGRAM COMPLETE

## 2017-08-04 LAB — TYPE AND SCREEN
ABO/RH(D): O NEG
ANTIBODY SCREEN: NEGATIVE

## 2017-08-04 LAB — MAGNESIUM: MAGNESIUM: 1.6 mg/dL — AB (ref 1.7–2.4)

## 2017-08-04 LAB — ABO/RH: ABO/RH(D): O NEG

## 2017-08-04 LAB — PHOSPHORUS: Phosphorus: <1 mg/dL — CL (ref 2.5–4.6)

## 2017-08-04 MED ORDER — POTASSIUM CHLORIDE CRYS ER 20 MEQ PO TBCR
40.0000 meq | EXTENDED_RELEASE_TABLET | Freq: Once | ORAL | Status: AC
  Administered 2017-08-04: 40 meq via ORAL
  Filled 2017-08-04: qty 2

## 2017-08-04 MED ORDER — MIRTAZAPINE 15 MG PO TABS
45.0000 mg | ORAL_TABLET | Freq: Every day | ORAL | Status: DC
  Administered 2017-08-04 – 2017-08-06 (×4): 45 mg via ORAL
  Filled 2017-08-04 (×4): qty 3

## 2017-08-04 MED ORDER — DEXTROSE 5 % IV SOLN
2.0000 g | Freq: Every day | INTRAVENOUS | Status: DC
  Administered 2017-08-04 – 2017-08-06 (×4): 2 g via INTRAVENOUS
  Filled 2017-08-04 (×4): qty 2

## 2017-08-04 MED ORDER — BUSPIRONE HCL 10 MG PO TABS
30.0000 mg | ORAL_TABLET | Freq: Every day | ORAL | Status: DC
  Administered 2017-08-04 – 2017-08-07 (×4): 30 mg via ORAL
  Filled 2017-08-04 (×4): qty 3

## 2017-08-04 MED ORDER — CLONIDINE HCL 0.1 MG PO TABS
0.1000 mg | ORAL_TABLET | Freq: Every day | ORAL | Status: DC
  Administered 2017-08-04 – 2017-08-07 (×4): 0.1 mg via ORAL
  Filled 2017-08-04 (×4): qty 1

## 2017-08-04 MED ORDER — ALBUTEROL SULFATE (2.5 MG/3ML) 0.083% IN NEBU: 2.5000 mg | INHALATION_SOLUTION | RESPIRATORY_TRACT | Status: DC | PRN

## 2017-08-04 MED ORDER — GUAIFENESIN ER 600 MG PO TB12
600.0000 mg | ORAL_TABLET | Freq: Two times a day (BID) | ORAL | Status: DC
  Administered 2017-08-04 – 2017-08-07 (×8): 600 mg via ORAL
  Filled 2017-08-04 (×8): qty 1

## 2017-08-04 MED ORDER — FLUOXETINE HCL 20 MG PO CAPS
40.0000 mg | ORAL_CAPSULE | Freq: Every day | ORAL | Status: DC
  Administered 2017-08-04 – 2017-08-07 (×4): 40 mg via ORAL
  Filled 2017-08-04 (×4): qty 2

## 2017-08-04 MED ORDER — SIMVASTATIN 10 MG PO TABS
10.0000 mg | ORAL_TABLET | Freq: Every day | ORAL | Status: DC
  Administered 2017-08-04 – 2017-08-06 (×4): 10 mg via ORAL
  Filled 2017-08-04 (×4): qty 1

## 2017-08-04 MED ORDER — ACETAMINOPHEN 650 MG RE SUPP: 650.0000 mg | Freq: Four times a day (QID) | RECTAL | Status: DC | PRN

## 2017-08-04 MED ORDER — MAGNESIUM SULFATE 2 GM/50ML IV SOLN
2.0000 g | Freq: Once | INTRAVENOUS | Status: AC
  Administered 2017-08-04: 2 g via INTRAVENOUS
  Filled 2017-08-04: qty 50

## 2017-08-04 MED ORDER — ONDANSETRON HCL 4 MG/2ML IJ SOLN: 4.0000 mg | Freq: Four times a day (QID) | INTRAMUSCULAR | Status: DC | PRN

## 2017-08-04 MED ORDER — TRAZODONE HCL 50 MG PO TABS
50.0000 mg | ORAL_TABLET | Freq: Every day | ORAL | Status: DC
  Administered 2017-08-04 – 2017-08-06 (×4): 50 mg via ORAL
  Filled 2017-08-04 (×4): qty 1

## 2017-08-04 MED ORDER — POTASSIUM PHOSPHATES 15 MMOLE/5ML IV SOLN
20.0000 meq | Freq: Once | INTRAVENOUS | Status: AC
  Administered 2017-08-04: 20 meq via INTRAVENOUS
  Filled 2017-08-04: qty 4.55

## 2017-08-04 MED ORDER — ACYCLOVIR 400 MG PO TABS
400.0000 mg | ORAL_TABLET | Freq: Two times a day (BID) | ORAL | Status: DC
  Administered 2017-08-04 – 2017-08-07 (×7): 400 mg via ORAL
  Filled 2017-08-04 (×7): qty 1

## 2017-08-04 MED ORDER — POLYETHYLENE GLYCOL 3350 17 G PO PACK
17.0000 g | PACK | Freq: Every day | ORAL | Status: DC | PRN
  Administered 2017-08-06: 17 g via ORAL
  Filled 2017-08-04: qty 1

## 2017-08-04 MED ORDER — PANTOPRAZOLE SODIUM 40 MG PO TBEC
40.0000 mg | DELAYED_RELEASE_TABLET | Freq: Every day | ORAL | Status: DC
  Administered 2017-08-04 – 2017-08-07 (×4): 40 mg via ORAL
  Filled 2017-08-04 (×4): qty 1

## 2017-08-04 MED ORDER — TAMSULOSIN HCL 0.4 MG PO CAPS
0.4000 mg | ORAL_CAPSULE | Freq: Every day | ORAL | Status: DC
  Administered 2017-08-04 – 2017-08-07 (×4): 0.4 mg via ORAL
  Filled 2017-08-04 (×4): qty 1

## 2017-08-04 MED ORDER — VANCOMYCIN HCL IN DEXTROSE 1-5 GM/200ML-% IV SOLN
1000.0000 mg | INTRAVENOUS | Status: DC
  Administered 2017-08-04 – 2017-08-05 (×2): 1000 mg via INTRAVENOUS
  Filled 2017-08-04: qty 200

## 2017-08-04 MED ORDER — HYDROCODONE-ACETAMINOPHEN 5-325 MG PO TABS
1.0000 | ORAL_TABLET | ORAL | Status: DC | PRN
  Administered 2017-08-04: 1 via ORAL
  Filled 2017-08-04: qty 1

## 2017-08-04 MED ORDER — VANCOMYCIN HCL IN DEXTROSE 750-5 MG/150ML-% IV SOLN
750.0000 mg | Freq: Two times a day (BID) | INTRAVENOUS | Status: DC
  Filled 2017-08-04: qty 150

## 2017-08-04 MED ORDER — ONDANSETRON HCL 4 MG PO TABS: 4.0000 mg | ORAL_TABLET | Freq: Four times a day (QID) | ORAL | Status: DC | PRN

## 2017-08-04 MED ORDER — ACETAMINOPHEN 325 MG PO TABS: 650.0000 mg | ORAL_TABLET | Freq: Four times a day (QID) | ORAL | Status: DC | PRN

## 2017-08-04 MED ORDER — BISACODYL 10 MG RE SUPP: 10.0000 mg | Freq: Every day | RECTAL | Status: DC | PRN

## 2017-08-04 MED ORDER — SENNA 8.6 MG PO TABS
1.0000 | ORAL_TABLET | Freq: Two times a day (BID) | ORAL | Status: DC
  Administered 2017-08-04 – 2017-08-07 (×8): 8.6 mg via ORAL
  Filled 2017-08-04 (×8): qty 1

## 2017-08-04 MED ORDER — FUROSEMIDE 10 MG/ML IJ SOLN
40.0000 mg | Freq: Every day | INTRAMUSCULAR | Status: DC
  Administered 2017-08-04 – 2017-08-07 (×4): 40 mg via INTRAVENOUS
  Filled 2017-08-04 (×4): qty 4

## 2017-08-04 NOTE — Progress Notes (Signed)
  Echocardiogram 2D Echocardiogram has been performed.  Merrie Roof F 08/04/2017, 3:56 PM

## 2017-08-04 NOTE — Progress Notes (Signed)
[facility] PROGRESS NOTE  Peter Wolfe IOX:[id number] DOB: 18-May-1929 DOA: 08/03/2017  PCP: [name], NP  Brief History/Interval Summary: 83 year old Caucasian male with past medical history of multiple myeloma recently started chemotherapy presents with shortness of breath.  He was found to have pneumonia and he was hospitalized.  Reason for Visit: Healthcare associated pneumonia  Consultants: None  Procedures: None  Antibiotics: Vancomycin and cefepime  Subjective/Interval History: Patient states that he is feeling better.  Continues to have a cough with clear expectoration.  Denies any nausea vomiting  ROS: No chest pain   Objective:  Vital Signs  Vitals:   08/04/17 0602 08/04/17 0846 08/04/17 0854 08/04/17 0855  BP: (!) 150/92 129/69 130/71 124/68  Pulse: 68 77 91 93  Resp: 18     Temp: 97.6 F (36.4 C) (!) 97.4 F (36.3 C)    TempSrc: Oral Oral    SpO2: 100% 98% 95% 93%    Intake/Output Summary (Last 24 hours) at 08/04/2017 1331 Last data filed at 08/04/2017 0910 Gross per 24 hour  Intake 924.55 ml  Output 400 ml  Net 524.55 ml   There were no vitals filed for this visit.  General appearance: alert, cooperative, appears stated age and no distress Resp: Crackles heard bilateral lung bases.  No wheezing.  No rhonchi.  Mildly tachypneic. Cardio: regular rate and rhythm, S1, S2 normal, no murmur, click, rub or gallop GI: soft, non-tender; bowel sounds normal; no masses,  no organomegaly Extremities: extremities normal, atraumatic, no cyanosis or edema Neurologic: No obvious focal deficits.  Noted to be mildly distracted.  Lab Results:  Data Reviewed: I have personally reviewed following labs and imaging studies  CBC: Recent Labs  Lab 07/30/17 0808 08/03/17 1531 08/04/17 0146  WBC 4.1 6.6 5.0  NEUTROABS 2.7 5.9  --   HGB 9.2* 7.8* 7.7*  HCT 27.5* 23.3* 22.7*  MCV 103.0* 101.7* 100.9*  PLT 114* 100* 106*    Basic  Metabolic Panel: Recent Labs  Lab 07/30/17 0808 08/03/17 1531 08/04/17 0146  NA 139 132* 131*  K 3.8 3.4* 3.4*  CL  --  100* 97*  CO2 24 23 24   GLUCOSE 163* 161* 199*  BUN 20.7 19 19   CREATININE 1.3 1.30* 1.25*  CALCIUM 8.7 8.5* 8.1*  MG 1.7  --  1.6*  PHOS  --   --  <1.0*    GFR: Estimated Creatinine Clearance: 36.9 mL/min (A) (by C-G formula based on SCr of 1.25 mg/dL (H)).  Liver Function Tests: Recent Labs  Lab 07/30/17 0808 08/03/17 1531 08/04/17 0146  AST 14 18 16   ALT 10 12* 12*  ALKPHOS 46 56 50  BILITOT 0.88 0.8 0.6  PROT 9.6* 9.4* 9.1*  ALBUMIN 2.9* 2.8* 2.6*    Cardiac Enzymes: Recent Labs  Lab 08/03/17 1531 08/03/17 2215  TROPONINI <0.03 <0.03    Thyroid Function Tests: Recent Labs    08/04/17 0146  TSH 0.763     Radiology Studies: Dg Chest 2 View  Result Date: 08/03/2017 CLINICAL DATA:  Shortness of breath 5 days severe this morning. History multiple myeloma. EXAM: CHEST  2 VIEW COMPARISON:  12/31/2016. FINDINGS: Lungs are adequately inflated with mild opacification of the medial lung bases which may be due to vascular crowding although cannot exclude atelectasis/ infection. Small amount of pleural fluid posteriorly on the lateral film. Evidence of patient's moderate size hiatal hernia. Cardiomediastinal silhouette is within normal. There is calcified plaque over the aortic arch. Known severe T9  compression fracture. IMPRESSION: Mild opacification over the medial lung bases which may be due to vascular crowding versus atelectasis or infection. Small amount of pleural fluid posteriorly on the lateral film. Moderate size hiatal hernia. Known T9 compression fracture. Electronically Signed   By: Marin Olp M.D.   On: 08/03/2017 15:20   Ct Angio Chest Pe W/cm &/or Wo Cm  Result Date: 08/03/2017 CLINICAL DATA:  83 year old who is currently undergoing chemotherapy for multiple myeloma, presenting with acute onset of shortness of breath, productive  cough and wheezing that began after chemotherapy 4 days ago. Current history of COPD. EXAM: CT ANGIOGRAPHY CHEST WITH CONTRAST TECHNIQUE: Multidetector CT imaging of the chest was performed using the standard protocol during bolus administration of intravenous contrast. Multiplanar CT image reconstructions and MIPs were obtained to evaluate the vascular anatomy. CONTRAST:  100 mL Isovue 370 IV. COMPARISON:  PET-CT 07/20/2017.  No prior chest CT. FINDINGS: Cardiovascular: Contrast opacification of pulmonary arteries is very good. No filling defects within either main pulmonary artery or their segmental branches in either lung to suggest pulmonary embolism. Heart mildly enlarged. Moderate-to-marked three-vessel coronary atherosclerosis. Mild aortic valvular calcification. Mild aortic annular calcification. Moderate to severe atherosclerosis involving the thoracic and upper abdominal aorta. Ectatic ascending thoracic aorta with maximum diameter approximating 4.0 cm. Mediastinum/Nodes: No pathologic mediastinal, hilar or axillary lymphadenopathy. Normal appearing gas-filled esophagus. Large hiatal hernia with greater than 3/4 of the stomach located within the chest. Small bilateral thyroid gland nodules, the largest approximately 8 mm arising from the lower pole of the left lobe. Lungs/Pleura: Emphysematous changes throughout both lungs. Airspace consolidation involving the lower lobes bilaterally with air bronchograms. Small to moderate-sized bilateral pleural effusions. Mild interstitial opacities with interlobular septal thickening. Scattered areas of focal hyperlucency throughout both lungs. Central airways patent with mild bronchial wall thickening. Upper Abdomen: Visualized pancreas atrophic. Visualized upper abdomen otherwise unremarkable for the early arterial phase of enhancement. Musculoskeletal: Severe compression fracture involving the T9 vertebral body, unchanged. Remote healed fracture involving these mid  sternal body. No acute fractures. Generalized osseous demineralization. Review of the MIP images confirms the above findings. IMPRESSION: 1. No evidence of pulmonary embolism. 2. Bilateral lower lobe pneumonia (favored over atelectasis) superimposed upon acute bronchitis and/or asthma. 3. Small to moderate-sized bilateral pleural effusions. 4. Very large hiatal hernia. 5. Moderate to marked three-vessel coronary atherosclerosis. 6. Ectatic ascending thoracic aorta with maximum diameter 4.0 cm. Recommend annual imaging followup by CTA or MRA. This recommendation follows 2010 ACCF/AHA/AATS/ACR/ASA/SCA/SCAI/SIR/STS/SVM Guidelines for the Diagnosis and Management of Patients with Thoracic Aortic Disease. Circulation. 2010; 121: A213-Y865. Aortic Atherosclerosis (ICD10-I70.0). Emphysema (ICD10-J43.9). Aortic aneurysm NOS (ICD10-I71.9). Electronically Signed   By: Evangeline Dakin M.D.   On: 08/03/2017 19:50     Medications:  Scheduled: . acyclovir  400 mg Oral BID  . busPIRone  30 mg Oral Daily  . cloNIDine  0.1 mg Oral Daily  . FLUoxetine  40 mg Oral Daily  . guaiFENesin  600 mg Oral BID  . mirtazapine  45 mg Oral QHS  . pantoprazole  40 mg Oral Daily  . senna  1 tablet Oral BID  . simvastatin  10 mg Oral QHS  . tamsulosin  0.4 mg Oral Daily  . traZODone  50 mg Oral QHS   Continuous: . ceFEPime (MAXIPIME) IV 2 g (08/04/17 0215)  . vancomycin 1,000 mg (08/04/17 0245)   HQI:[id number] **OR** acetaminophen, albuterol, bisacodyl, HYDROcodone-acetaminophen, ondansetron **OR** ondansetron (ZOFRAN) IV, polyethylene glycol  Assessment/Plan:  Active Problems:   Essential  hypertension, benign   CKD (chronic kidney disease), stage Wolfe ([place])   Multiple myeloma not having achieved remission ([place])   HCAP (healthcare-associated pneumonia)   Symptomatic anemia   Pneumonia    Healthcare associated pneumonia Continue broad-spectrum antibiotics.  Await culture data.  Patient seems to be stable from  a respiratory standpoint.  Possible fluid overload He does have bilateral pleural effusions.  No echocardiogram data is available.  He was given Lasix.  Continue Lasix once a day for now.  Echocardiogram has been ordered.  Replace potassium.  Chronic kidney disease stage Wolfe Continue to monitor renal function.  Monitor urine output.  Essential hypertension Continue home medications.  Noted to be on clonidine.  History of multiple myeloma He underwent chemotherapy recently.  Oncology is following.  Macrocytic anemia No evidence for overt bleeding.  Transfuse if hemoglobin drops below 7.  Hyponatremia and hypokalemia and hypomagnesemia Replace potassium magnesium.  Monitor sodium closely  Ectatic ascending thoracic aorta with maximum diameter 4.0 cm.  Seen incidentally on CT chest. Recommend annual imaging followup by CTA or MRA  DVT Prophylaxis: SCD's    Code Status: DNR   Family Communication: Discussed with patient  Disposition Plan: Management as outlined above. Mobilize.    LOS: 1 day   [facility] Pager [phone] 08/04/2017, 1:31 PM  If 7PM-7AM, please contact night-coverage at [email or web address], password [facility]

## 2017-08-04 NOTE — Care Management Note (Signed)
Case Management Note  Patient Details  Name: Peter Wolfe MRN: [id number] Date of Birth: 03/24/1929  Subjective/Objective:                  resp distress  Action/Plan: Date: August 04, 2017 [name]  [phone] Chart and notes review for patient progress and needs. Will follow for case management and discharge needs. Next review date: [id number]  Expected Discharge Date:  (unknown)               Expected Discharge Plan:  Home/Self Care  In-House Referral:     Discharge planning Services  CM Consult  Post Acute Care Choice:    Choice offered to:     DME Arranged:    DME Agency:     HH Arranged:    HH Agency:     Status of Service:  In process, will continue to follow  If discussed at Long Length of Stay Meetings, dates discussed:    Additional Comments:  Leeroy Cha, RN 08/04/2017, 8:53 AM

## 2017-08-04 NOTE — Progress Notes (Addendum)
Pharmacy Antibiotic Note  Peter Wolfe is a 83 y.o. male admitted on 08/03/2017 with pneumonia.  Pharmacy has been consulted for Vancomycin, cefepime  dosing.  Plan: Vancomycin 1000mg  IV every 36 hours.  Goal AUC = 400 - 500 for all indications, except meningitis (goal AUC > 500 and Cmin 15-20 mcg/mL)  Change Cefepime to 2gm iv q24hr, due to renal function     Temp (24hrs), Avg:98.5 F (36.9 C), Min:98.2 F (36.8 C), Max:98.7 F (37.1 C)  Recent Labs  Lab 07/30/17 0808 07/30/17 0808 08/03/17 1531  WBC 4.1  --  6.6  CREATININE  --  1.3 1.30*    Estimated Creatinine Clearance: 35.5 mL/min (A) (by C-G formula based on SCr of 1.3 mg/dL (H)).    Allergies  Allergen Reactions  . Influenza Virus Vacc Split Pf Other (See Comments)    Allergic to eggs  . Doxycycline Rash  . Eggs Or Egg-Derived Products Rash    Antimicrobials this admission: Vancomycin 08/04/2017 >> Cefepime 08/04/2017 >>   Dose adjustments this admission: -  Microbiology results: pending  Thank you for allowing pharmacy to be a part of this patient's care.  Nani Skillern Crowford 08/04/2017 1:55 AM

## 2017-08-04 NOTE — ED Notes (Signed)
Report given to [facility], pt ready for transport

## 2017-08-04 NOTE — Progress Notes (Signed)
Initial Nutrition Assessment  DOCUMENTATION CODES:   Non-severe (moderate) malnutrition in context of chronic illness  INTERVENTION:    RD to encourage PO intake  Magic cup TID with meals, each supplement provides 290 kcal and 9 grams of protein  Pt is at high risk for refeeding. Monitor and supplement electrolytes as needed per MD descretion.   NUTRITION DIAGNOSIS:   Moderate Malnutrition related to chronic illness, cancer and cancer related treatments as evidenced by energy intake < or equal to 50% for > or equal to 1 month, moderate fat depletion, moderate muscle depletion.  GOAL:   Patient will meet greater than or equal to 90% of their needs  MONITOR:   PO intake, Supplement acceptance, Weight trends, Labs  REASON FOR ASSESSMENT:   Malnutrition Screening Tool    ASSESSMENT:   Pt with PMH significant for multiple myeloma (currently on chemotherapy, last treatment 11/1), COPD, HTN, and CKD III. Presents this admission with increasing shortness of breath with wheezing/coughing. Admitted for HCAP.   Spoke with pt at bedside.  Reports having decreased PO intake for the last 3-4 months after hospitalization for multiple fractures.  Pt typically consumes 2 meals per day that consist of frozen dinners and easy to make foods.  Pt started his first chemotherapy treatment 11/1.  Reports meals went from 2 per day to bites and sips s/p treatment.  Pt does not consume supplementation at home as they make him "gag".  Reports a UBW of 150 lb, the last time being at that weight in March 2018.  Records indicate pt's weight has trending up from 124 lb to 136 lb in the last three months.  Pt amenable to trying magic cups for increased protein.  Nutrition-Focused physical exam completed. See findings below.  Pt is at high risk for refeeding. Monitor and supplement electrolytes as needed per MD descretion.   Medications reviewed and include: IV abx, potassium phosphate Labs reviewed:  Na 131 (L) K 3.4 (L) Phosphorus <1.0 (L), Mg 1.6 (L)  NUTRITION - FOCUSED PHYSICAL EXAM:    Most Recent Value  Orbital Region  Moderate depletion  Upper Arm Region  Moderate depletion  Thoracic and Lumbar Region  Unable to assess  Buccal Region  Mild depletion  Temple Region  Moderate depletion  Clavicle Bone Region  Mild depletion  Clavicle and Acromion Bone Region  Mild depletion  Scapular Bone Region  Unable to assess  Dorsal Hand  Mild depletion  Patellar Region  Moderate depletion  Anterior Thigh Region  Moderate depletion  Posterior Calf Region  Moderate depletion  Edema (RD Assessment)  None  Hair  Reviewed  Eyes  Reviewed  Mouth  Reviewed  Skin  Reviewed  Nails  Reviewed       Diet Order:  Diet Heart Room service appropriate? Yes; Fluid consistency: Thin  EDUCATION NEEDS:   Education needs have been addressed  Skin:  Skin Assessment: Reviewed RN Assessment  Last BM:  08/03/17  Height:   Ht Readings from Last 1 Encounters:  07/30/17 5' 5"  (1.651 m)    Weight:   Wt Readings from Last 1 Encounters:  07/30/17 136 lb 3.2 oz (61.8 kg)    Ideal Body Weight:  61.8 kg  BMI:  There is no height or weight on file to calculate BMI.  Estimated Nutritional Needs:   Kcal:  1850-2150 kcal/day  Protein:  95-105 g/day  Fluid:  >1.8 L/day    Mariana Single RD, LDN Clinical Nutrition Pager # - [phone]

## 2017-08-04 NOTE — Consult Note (Signed)
IP PROGRESS NOTE Requested by Dr Bonnielee Haff  Subjective:  Peter Wolfe is an 83 y.o. male with a recent diagnosis of multiple myeloma, started on palliative systemic therapy with bortezomib and low-dose dexamethasone last week Thursday.  Patient reports feeling well following the treatment, but developing expiratory wheezing and progressive congestion in the chest with shortness of breath starting Friday.  He did not contact our clinic or any other of his healthcare providers, but by yesterday symptoms became intolerable.  He called our triage nurse and was advised to call 911.  EMT brought him to the emergency room where he was found to have pneumonia and possible COPD exacerbation.  Due to his advanced age and severity of his condition, he was admitted to the hospital and started on parenteral antibiotics for healthcare-associated pneumonia diagnosis.  Today, patient feels significantly better as compared to yesterday.  His appetite has returned, and his breathing improved significantly.  He denies any active productive cough, shortness of breath, or expiratory wheezing.  Currently, denies any abdominal discomfort, diarrhea, or constipation.  Previously, had significant constipation but with adjustment of his regimen that we have done at last visit to the clinic, he was able to have reliable bowel movements.  Objective: Vital signs in last 24 hours: Blood pressure 124/68, pulse 93, temperature (!) 97.4 F (36.3 C), temperature source Oral, resp. rate 18, SpO2 93 %.  Intake/Output from previous day: 11/05 0701 - 11/06 0700 In: 804.6 [IV Piggyback:804.6] Out: 400 [Urine:400]  Physical Exam:  Patient is alert, awake, oriented x3. HEENT: Anicteric, moist mucous membranes Lungs: Coarse breath sounds bilaterally.  No dullness to percussion or expiratory wheezing Cardiac: S1/S2, regular, no murmurs. Abdomen: Soft, nontender, nondistended.  Bowel sounds are positive Extremities: No  significant lower extremity edema Nonfocal neurological examination  Lab Results: Recent Labs    08/03/17 1531 08/04/17 0146  WBC 6.6 5.0  HGB 7.8* 7.7*  HCT 23.3* 22.7*  PLT 100* 106*    BMET Recent Labs    08/03/17 1531 08/04/17 0146  NA 132* 131*  K 3.4* 3.4*  CL 100* 97*  CO2 23 24  GLUCOSE 161* 199*  BUN 19 19  CREATININE 1.30* 1.25*  CALCIUM 8.5* 8.1*    No results found for: CEA1  Studies/Results: Dg Chest 2 View  Result Date: 08/03/2017 CLINICAL DATA:  Shortness of breath 5 days severe this morning. History multiple myeloma. EXAM: CHEST  2 VIEW COMPARISON:  12/31/2016. FINDINGS: Lungs are adequately inflated with mild opacification of the medial lung bases which may be due to vascular crowding although cannot exclude atelectasis/ infection. Small amount of pleural fluid posteriorly on the lateral film. Evidence of patient's moderate size hiatal hernia. Cardiomediastinal silhouette is within normal. There is calcified plaque over the aortic arch. Known severe T9 compression fracture. IMPRESSION: Mild opacification over the medial lung bases which may be due to vascular crowding versus atelectasis or infection. Small amount of pleural fluid posteriorly on the lateral film. Moderate size hiatal hernia. Known T9 compression fracture. Electronically Signed   By: Marin Olp M.D.   On: 08/03/2017 15:20   Ct Angio Chest Pe W/cm &/or Wo Cm  Result Date: 08/03/2017 CLINICAL DATA:  83 year old who is currently undergoing chemotherapy for multiple myeloma, presenting with acute onset of shortness of breath, productive cough and wheezing that began after chemotherapy 4 days ago. Current history of COPD. EXAM: CT ANGIOGRAPHY CHEST WITH CONTRAST TECHNIQUE: Multidetector CT imaging of the chest was performed using the standard  protocol during bolus administration of intravenous contrast. Multiplanar CT image reconstructions and MIPs were obtained to evaluate the vascular anatomy.  CONTRAST:  100 mL Isovue 370 IV. COMPARISON:  PET-CT 07/20/2017.  No prior chest CT. FINDINGS: Cardiovascular: Contrast opacification of pulmonary arteries is very good. No filling defects within either main pulmonary artery or their segmental branches in either lung to suggest pulmonary embolism. Heart mildly enlarged. Moderate-to-marked three-vessel coronary atherosclerosis. Mild aortic valvular calcification. Mild aortic annular calcification. Moderate to severe atherosclerosis involving the thoracic and upper abdominal aorta. Ectatic ascending thoracic aorta with maximum diameter approximating 4.0 cm. Mediastinum/Nodes: No pathologic mediastinal, hilar or axillary lymphadenopathy. Normal appearing gas-filled esophagus. Large hiatal hernia with greater than 3/4 of the stomach located within the chest. Small bilateral thyroid gland nodules, the largest approximately 8 mm arising from the lower pole of the left lobe. Lungs/Pleura: Emphysematous changes throughout both lungs. Airspace consolidation involving the lower lobes bilaterally with air bronchograms. Small to moderate-sized bilateral pleural effusions. Mild interstitial opacities with interlobular septal thickening. Scattered areas of focal hyperlucency throughout both lungs. Central airways patent with mild bronchial wall thickening. Upper Abdomen: Visualized pancreas atrophic. Visualized upper abdomen otherwise unremarkable for the early arterial phase of enhancement. Musculoskeletal: Severe compression fracture involving the T9 vertebral body, unchanged. Remote healed fracture involving these mid sternal body. No acute fractures. Generalized osseous demineralization. Review of the MIP images confirms the above findings. IMPRESSION: 1. No evidence of pulmonary embolism. 2. Bilateral lower lobe pneumonia (favored over atelectasis) superimposed upon acute bronchitis and/or asthma. 3. Small to moderate-sized bilateral pleural effusions. 4. Very large hiatal  hernia. 5. Moderate to marked three-vessel coronary atherosclerosis. 6. Ectatic ascending thoracic aorta with maximum diameter 4.0 cm. Recommend annual imaging followup by CTA or MRA. This recommendation follows 2010 ACCF/AHA/AATS/ACR/ASA/SCA/SCAI/SIR/STS/SVM Guidelines for the Diagnosis and Management of Patients with Thoracic Aortic Disease. Circulation. 2010; 121: T888-K800. Aortic Atherosclerosis (ICD10-I70.0). Emphysema (ICD10-J43.9). Aortic aneurysm NOS (ICD10-I71.9). Electronically Signed   By: Evangeline Dakin M.D.   On: 08/03/2017 19:50    Medications: I have reviewed the patient's current medications.  Assessment/Plan: 83 y.o. male with diagnosis of active multiple myeloma, recently having started systemic therapy with bortezomib and low-dose dexamethasone.  Tolerated initial treatment without complications, but developed healthcare associated pneumonia at this time.  Appears to feel significantly better with instituted respiratory therapy and antibiotics.  Recommendations: --I will hold antineoplastic therapy for this week.  Combination of Revlimid and dexamethasone is not deeply immunosuppressive, but that has weakened immune system to a significant degree and patient's recovery from pneumonia will be facilitated by withholding the treatment at this time --Agree with the current respiratory and antimicrobial therapy --Electrolyte replacement under Hospitalist guidance --Transfusion recommendations: transfuse 1U pRBC, irradiated for Hgb <=7.0; NO need for Plt transfusions at this time --I will continue following the patient daily while he is admitted and will update my recommendations as needed; I would like to see him in my clinic next week on Mon or Tue for recovery monitoring   LOS: 1 day   Ardath Sax, MD   08/04/2017, 1:17 PM

## 2017-08-05 DIAGNOSIS — N183 Chronic kidney disease, stage 3 (moderate): Secondary | ICD-10-CM

## 2017-08-05 DIAGNOSIS — D649 Anemia, unspecified: Secondary | ICD-10-CM

## 2017-08-05 LAB — BASIC METABOLIC PANEL
Anion gap: 5 (ref 5–15)
BUN: 22 mg/dL — ABNORMAL HIGH (ref 6–20)
CALCIUM: 8.1 mg/dL — AB (ref 8.9–10.3)
CO2: 29 mmol/L (ref 22–32)
CREATININE: 1.32 mg/dL — AB (ref 0.61–1.24)
Chloride: 101 mmol/L (ref 101–111)
GFR, EST AFRICAN AMERICAN: 55 mL/min — AB (ref >60)
GFR, EST NON AFRICAN AMERICAN: 47 mL/min — AB (ref >60)
GLUCOSE: 98 mg/dL (ref 65–99)
Potassium: 4.1 mmol/L (ref 3.5–5.1)
Sodium: 135 mmol/L (ref 135–145)

## 2017-08-05 LAB — CBC
HEMATOCRIT: 24.6 % — AB (ref 39.0–52.0)
Hemoglobin: 8 g/dL — ABNORMAL LOW (ref 13.0–17.0)
MCH: 33.8 pg (ref 26.0–34.0)
MCHC: 32.5 g/dL (ref 30.0–36.0)
MCV: 103.8 fL — ABNORMAL HIGH (ref 78.0–100.0)
PLATELETS: 116 10*3/uL — AB (ref 150–400)
RBC: 2.37 MIL/uL — ABNORMAL LOW (ref 4.22–5.81)
RDW: 14.9 % (ref 11.5–15.5)
WBC: 6 10*3/uL (ref 4.0–10.5)

## 2017-08-05 LAB — MAGNESIUM: Magnesium: 2 mg/dL (ref 1.7–2.4)

## 2017-08-05 LAB — MRSA PCR SCREENING: MRSA by PCR: NEGATIVE

## 2017-08-05 LAB — PHOSPHORUS: Phosphorus: 2.1 mg/dL — ABNORMAL LOW (ref 2.5–4.6)

## 2017-08-05 MED ORDER — SODIUM PHOSPHATES 45 MMOLE/15ML IV SOLN
20.0000 mmol | Freq: Once | INTRAVENOUS | Status: AC
  Administered 2017-08-05: 20 mmol via INTRAVENOUS
  Filled 2017-08-05: qty 6.67

## 2017-08-05 NOTE — Progress Notes (Signed)
Patient ID: Peter Wolfe, male   DOB: 1929-01-07, 83 y.o.   MRN: [id number]  PROGRESS NOTE    HONG MORING III  RJJ:[id number] DOB: Mar 01, 1929 DOA: 08/03/2017 PCP: [name], NP   Brief Narrative:  83 year old male with history of multiple myeloma recently started on chemotherapy presented with shortness of breath. He was admitted with pneumonia and started on IV antibiotics   Assessment & Plan:   Active Problems:   Essential hypertension, benign   CKD (chronic kidney disease), stage III ([place])   Multiple myeloma not having achieved remission ([place])   HCAP (healthcare-associated pneumonia)   Symptomatic anemia   Pneumonia   Healthcare associated pneumonia with concern for gram-negative versus MRSA pneumonia Continue broad-spectrum antibiotics.   follow cultures similar negative so far.  - Oxygen supplementation if needed    Possible fluid overload - Echo showed ejection fraction of 55-60%. Continue Lasix. - Repeat chest x-ray for tomorrow  Chronic kidney disease stage III - Renal function stable. Monitor  Essential hypertension Continue Lasix and clonidine. Monitor blood pressure  History of multiple myeloma Status post recent chemotherapy. Oncology following  Macrocytic anemia No evidence for overt bleeding.  Transfuse if hemoglobin drops below 7.  Hyponatremia and hypokalemia and hypomagnesemia - Improved. Monitor  Ectatic ascending thoracic aorta with maximum diameter 4.0 cm.  Seen incidentally on CT chest. Recommend annual imaging followup by CTA or MRA   DVT prophylaxis: SCDs Code Status:  DO NOT RESUSCITATE Family Communication: None at bedside Disposition Plan: Home in 1-3 days  Consultants: Oncology  Procedures:  Echo on 08/04/2017 Study Conclusions  - Left ventricle: The cavity size was normal. Wall thickness was   increased in a pattern of mild LVH. Systolic function was normal.   The estimated ejection fraction was in  the range of 55% to 60%.   Wall motion was normal; there were no regional wall motion   abnormalities. Doppler parameters are consistent with abnormal   left ventricular relaxation (grade 1 diastolic dysfunction). - Aortic valve: There was no stenosis. - Aorta: Borderline dilated aortic root. Aortic root dimension: 37   mm (ED). - Mitral valve: Mildly calcified annulus. There was trivial   regurgitation. - Left atrium: The atrium was mildly dilated. - Right ventricle: The cavity size was normal. Systolic function   was normal. - Right atrium: The atrium was mildly dilated. - Tricuspid valve: Peak RV-RA gradient (S): 40 mm Hg. - Pulmonary arteries: PA peak pressure: 43 mm Hg (S). - Inferior vena cava: The vessel was normal in size. The   respirophasic diameter changes were in the normal range (>= 50%),   consistent with normal central venous pressure.  Impressions:  - Normal LV size with mild LV hypertrophy. EF 55-60%. Normal RV   size and systolic function. Mild pulmonary hypertension.   Antimicrobials: Cefepime and vancomycin from 08/03/2017   Subjective: Patient seen and examined at bedside. He feels slightly better but he is still weak and coughing. No overnight fever or vomiting  Objective: Vitals:   08/04/17 0855 08/04/17 1643 08/04/17 2157 08/05/17 0511  BP: 124/68 (!) 145/69 (!) 143/70 (!) 164/87  Pulse: 93 70 70 75  Resp:    18  Temp:  (!) 97 F (36.1 C) 97.6 F (36.4 C) 97.6 F (36.4 C)  TempSrc:  Axillary Oral Oral  SpO2: 93% 98% 97% 100%    Intake/Output Summary (Last 24 hours) at 08/05/2017 1317 Last data filed at 08/05/2017 1042 Gross per 24 hour  Intake 120 ml  Output 2175 ml  Net -2055 ml   There were no vitals filed for this visit.  Examination:  General exam: Appears calm and comfortable  Respiratory system: Bilateral decreased breath sound at bases with some scattered crackles Cardiovascular system: S1 & S2 heard, rate controlled    Gastrointestinal system: Abdomen is nondistended, soft and nontender. Normal bowel sounds heard. Extremities: No cyanosis, clubbing; trace edema   Data Reviewed: I have personally reviewed following labs and imaging studies  CBC: Recent Labs  Lab 07/30/17 0808 08/03/17 1531 08/04/17 0146 08/05/17 0545  WBC 4.1 6.6 5.0 6.0  NEUTROABS 2.7 5.9  --   --   HGB 9.2* 7.8* 7.7* 8.0*  HCT 27.5* 23.3* 22.7* 24.6*  MCV 103.0* 101.7* 100.9* 103.8*  PLT 114* 100* 106* 628*   Basic Metabolic Panel: Recent Labs  Lab 07/30/17 0808 08/03/17 1531 08/04/17 0146 08/05/17 0545  NA 139 132* 131* 135  K 3.8 3.4* 3.4* 4.1  CL  --  100* 97* 101  CO2 _0 GLUCOSE 163* 161* 199* 98  BUN 20._1 22*  CREATININE 1.3 1.30* 1.25* 1.32*  CALCIUM 8.7 8.5* 8.1* 8.1*  MG 1.7  --  1.6* 2.0  PHOS  --   --  <1.0* 2.1*   GFR: Estimated Creatinine Clearance: 34.9 mL/min (A) (by C-G formula based on SCr of 1.32 mg/dL (H)). Liver Function Tests: Recent Labs  Lab 07/30/17 0808 08/03/17 1531 08/04/17 0146  AST _2 ALT 10 12* 12*  ALKPHOS 46 56 50  BILITOT 0.88 0.8 0.6  PROT 9.6* 9.4* 9.1*  ALBUMIN 2.9* 2.8* 2.6*   No results for input(s): LIPASE, AMYLASE in the last 168 hours. No results for input(s): AMMONIA in the last 168 hours. Coagulation Profile: No results for input(s): INR, PROTIME in the last 168 hours. Cardiac Enzymes: Recent Labs  Lab 08/03/17 1531 08/03/17 2215  TROPONINI <0.03 <0.03   BNP (last 3 results) No results for input(s): PROBNP in the last 8760 hours. HbA1C: No results for input(s): HGBA1C in the last 72 hours. CBG: No results for input(s): GLUCAP in the last 168 hours. Lipid Profile: No results for input(s): CHOL, HDL, LDLCALC, TRIG, CHOLHDL, LDLDIRECT in the last 72 hours. Thyroid Function Tests: Recent Labs    08/04/17 0146  TSH 0.763   Anemia Panel: No results for input(s): VITAMINB12, FOLATE, FERRITIN, TIBC, IRON, RETICCTPCT in the last  72 hours. Sepsis Labs: No results for input(s): PROCALCITON, LATICACIDVEN in the last 168 hours.  No results found for this or any previous visit (from the past 240 hour(s)).       Radiology Studies: Dg Chest 2 View  Result Date: 08/03/2017 CLINICAL DATA:  Shortness of breath 5 days severe this morning. History multiple myeloma. EXAM: CHEST  2 VIEW COMPARISON:  12/31/2016. FINDINGS: Lungs are adequately inflated with mild opacification of the medial lung bases which may be due to vascular crowding although cannot exclude atelectasis/ infection. Small amount of pleural fluid posteriorly on the lateral film. Evidence of patient's moderate size hiatal hernia. Cardiomediastinal silhouette is within normal. There is calcified plaque over the aortic arch. Known severe T9 compression fracture. IMPRESSION: Mild opacification over the medial lung bases which may be due to vascular crowding versus atelectasis or infection. Small amount of pleural fluid posteriorly on the lateral film. Moderate size hiatal hernia. Known T9 compression fracture. Electronically Signed   By: Marin Olp M.D.   On: 08/03/2017  15:20   Ct Angio Chest Pe W/cm &/or Wo Cm  Result Date: 08/03/2017 CLINICAL DATA:  83 year old who is currently undergoing chemotherapy for multiple myeloma, presenting with acute onset of shortness of breath, productive cough and wheezing that began after chemotherapy 4 days ago. Current history of COPD. EXAM: CT ANGIOGRAPHY CHEST WITH CONTRAST TECHNIQUE: Multidetector CT imaging of the chest was performed using the standard protocol during bolus administration of intravenous contrast. Multiplanar CT image reconstructions and MIPs were obtained to evaluate the vascular anatomy. CONTRAST:  100 mL Isovue 370 IV. COMPARISON:  PET-CT 07/20/2017.  No prior chest CT. FINDINGS: Cardiovascular: Contrast opacification of pulmonary arteries is very good. No filling defects within either main pulmonary artery or  their segmental branches in either lung to suggest pulmonary embolism. Heart mildly enlarged. Moderate-to-marked three-vessel coronary atherosclerosis. Mild aortic valvular calcification. Mild aortic annular calcification. Moderate to severe atherosclerosis involving the thoracic and upper abdominal aorta. Ectatic ascending thoracic aorta with maximum diameter approximating 4.0 cm. Mediastinum/Nodes: No pathologic mediastinal, hilar or axillary lymphadenopathy. Normal appearing gas-filled esophagus. Large hiatal hernia with greater than 3/4 of the stomach located within the chest. Small bilateral thyroid gland nodules, the largest approximately 8 mm arising from the lower pole of the left lobe. Lungs/Pleura: Emphysematous changes throughout both lungs. Airspace consolidation involving the lower lobes bilaterally with air bronchograms. Small to moderate-sized bilateral pleural effusions. Mild interstitial opacities with interlobular septal thickening. Scattered areas of focal hyperlucency throughout both lungs. Central airways patent with mild bronchial wall thickening. Upper Abdomen: Visualized pancreas atrophic. Visualized upper abdomen otherwise unremarkable for the early arterial phase of enhancement. Musculoskeletal: Severe compression fracture involving the T9 vertebral body, unchanged. Remote healed fracture involving these mid sternal body. No acute fractures. Generalized osseous demineralization. Review of the MIP images confirms the above findings. IMPRESSION: 1. No evidence of pulmonary embolism. 2. Bilateral lower lobe pneumonia (favored over atelectasis) superimposed upon acute bronchitis and/or asthma. 3. Small to moderate-sized bilateral pleural effusions. 4. Very large hiatal hernia. 5. Moderate to marked three-vessel coronary atherosclerosis. 6. Ectatic ascending thoracic aorta with maximum diameter 4.0 cm. Recommend annual imaging followup by CTA or MRA. This recommendation follows 2010  ACCF/AHA/AATS/ACR/ASA/SCA/SCAI/SIR/STS/SVM Guidelines for the Diagnosis and Management of Patients with Thoracic Aortic Disease. Circulation. 2010; 121: P619-J093. Aortic Atherosclerosis (ICD10-I70.0). Emphysema (ICD10-J43.9). Aortic aneurysm NOS (ICD10-I71.9). Electronically Signed   By: Evangeline Dakin M.D.   On: 08/03/2017 19:50        Scheduled Meds: . acyclovir  400 mg Oral BID  . busPIRone  30 mg Oral Daily  . cloNIDine  0.1 mg Oral Daily  . FLUoxetine  40 mg Oral Daily  . furosemide  40 mg Intravenous Daily  . guaiFENesin  600 mg Oral BID  . mirtazapine  45 mg Oral QHS  . pantoprazole  40 mg Oral Daily  . senna  1 tablet Oral BID  . simvastatin  10 mg Oral QHS  . tamsulosin  0.4 mg Oral Daily  . traZODone  50 mg Oral QHS   Continuous Infusions: . ceFEPime (MAXIPIME) IV Stopped (08/05/17 0020)  . sodium phosphate  Dextrose 5% IVPB 20 mmol (08/05/17 1202)  . vancomycin 1,000 mg (08/04/17 0245)     LOS: 2 days        Aline August, MD [facility] Pager [phone]  If 7PM-7AM, please contact night-coverage [email or web address] Password [email or web address] 08/05/2017, 1:17 PM

## 2017-08-06 ENCOUNTER — Inpatient Hospital Stay (HOSPITAL_COMMUNITY): Payer: Medicare Other

## 2017-08-06 LAB — BASIC METABOLIC PANEL
Anion gap: 7 (ref 5–15)
BUN: 26 mg/dL — AB (ref 6–20)
CALCIUM: 8.4 mg/dL — AB (ref 8.9–10.3)
CHLORIDE: 101 mmol/L (ref 101–111)
CO2: 29 mmol/L (ref 22–32)
CREATININE: 1.35 mg/dL — AB (ref 0.61–1.24)
GFR calc Af Amer: 53 mL/min — ABNORMAL LOW (ref >60)
GFR, EST NON AFRICAN AMERICAN: 46 mL/min — AB (ref >60)
Glucose, Bld: 103 mg/dL — ABNORMAL HIGH (ref 65–99)
Potassium: 3.7 mmol/L (ref 3.5–5.1)
SODIUM: 137 mmol/L (ref 135–145)

## 2017-08-06 LAB — CBC WITH DIFFERENTIAL/PLATELET
BASOS PCT: 0 %
Basophils Absolute: 0 10*3/uL (ref 0.0–0.1)
EOS PCT: 1 %
Eosinophils Absolute: 0 10*3/uL (ref 0.0–0.7)
HEMATOCRIT: 27.7 % — AB (ref 39.0–52.0)
Hemoglobin: 9.1 g/dL — ABNORMAL LOW (ref 13.0–17.0)
Lymphocytes Relative: 26 %
Lymphs Abs: 1 10*3/uL (ref 0.7–4.0)
MCH: 33.8 pg (ref 26.0–34.0)
MCHC: 32.9 g/dL (ref 30.0–36.0)
MCV: 103 fL — ABNORMAL HIGH (ref 78.0–100.0)
MONO ABS: 0.4 10*3/uL (ref 0.1–1.0)
MONOS PCT: 11 %
NEUTROS ABS: 2.3 10*3/uL (ref 1.7–7.7)
Neutrophils Relative %: 62 %
PLATELETS: 150 10*3/uL (ref 150–400)
RBC: 2.69 MIL/uL — ABNORMAL LOW (ref 4.22–5.81)
RDW: 14.6 % (ref 11.5–15.5)
WBC: 3.7 10*3/uL — ABNORMAL LOW (ref 4.0–10.5)

## 2017-08-06 LAB — PHOSPHORUS: Phosphorus: 2.8 mg/dL (ref 2.5–4.6)

## 2017-08-06 LAB — MAGNESIUM: MAGNESIUM: 1.9 mg/dL (ref 1.7–2.4)

## 2017-08-06 LAB — STREP PNEUMONIAE URINARY ANTIGEN: Strep Pneumo Urinary Antigen: NEGATIVE

## 2017-08-06 NOTE — Evaluation (Signed)
Physical Therapy Evaluation Patient Details Name: Peter Wolfe MRN: [id number] DOB: 03/27/1929 Today's Date: 08/06/2017   History of Present Illness  83 yo male admitted with CKD, Pna, bil pleural effuisons. Hx of multiple myeloma-on chemo, COPD, CKD, T9 comp fx, multiple rib fxs, anemia    Clinical Impression  On eval, pt was Min guard assist for mobility. He walked ~75 feet with use of a RW. O2 sat 91% on RA at rest, 85% on RA during ambulation. Discussed d/c plan-pt pans to return home. He plans to move to [place] to be closer to family after Thanksgiving. He politely declined HHPT f/u at time of eval. Will continue to assess and progress activity as tolerated.     Follow Up Recommendations No PT follow up;Supervision - Intermittent (continuing to assess)    Equipment Recommendations  None recommended by PT    Recommendations for Other Services       Precautions / Restrictions Precautions Precautions: Fall Restrictions Weight Bearing Restrictions: No      Mobility  Bed Mobility   Bed Mobility: Supine to Sit     Supine to sit: Modified independent (Device/Increase time)        Transfers Overall transfer level: Needs assistance Equipment used: Rolling walker (2 wheeled) Transfers: Sit to/from Stand Sit to Stand: Supervision         General transfer comment: for safety.   Ambulation/Gait Ambulation/Gait assistance: Min assist Ambulation Distance (Feet): 75 Feet Assistive device: Rolling walker (2 wheeled) Gait Pattern/deviations: Step-through pattern;Decreased stride length     General Gait Details: Intermittent assist to steady intitially. Progressed to Min guard assist towards end of distance. O2 sat 85% on RA, dyspnea 2/4.   Stairs            Wheelchair Mobility    Modified Rankin (Stroke Patients Only)       Balance Overall balance assessment: Needs assistance         Standing balance support: Bilateral upper extremity  supported Standing balance-Leahy Scale: Poor Standing balance comment: requires RW                             Pertinent Vitals/Pain Pain Assessment: No/denies pain    Home Living Family/patient expects to be discharged to:: Private residence Living Arrangements: Alone Available Help at Discharge: Friend(s);Available PRN/intermittently Type of Home: Apartment Home Access: Level entry     Home Layout: One level Home Equipment: Walker - 2 wheels;Cane - single point;Tub bench      Prior Function Level of Independence: Needs assistance   Gait / Transfers Assistance Needed: uses RW  ADL's / Homemaking Assistance Needed: drives. does own grocery shopping.         Hand Dominance        Extremity/Trunk Assessment   Upper Extremity Assessment Upper Extremity Assessment: Generalized weakness    Lower Extremity Assessment Lower Extremity Assessment: Generalized weakness    Cervical / Trunk Assessment Cervical / Trunk Assessment: Kyphotic  Communication   Communication: No difficulties  Cognition Arousal/Alertness: Awake/alert Behavior During Therapy: WFL for tasks assessed/performed Overall Cognitive Status: Within Functional Limits for tasks assessed                                        General Comments      Exercises     Assessment/Plan  PT Assessment Patient needs continued PT services  PT Problem List Decreased strength;Decreased mobility;Decreased activity tolerance;Decreased balance;Decreased knowledge of use of DME       PT Treatment Interventions DME instruction;Functional mobility training;Therapeutic activities;Therapeutic exercise;Balance training;Patient/family education    PT Goals (Current goals can be found in the Care Plan section)  Acute Rehab PT Goals Patient Stated Goal:  to move to [facility] to be close to family after Thanksgiving PT Goal Formulation: With patient Time For Goal Achievement:  08/20/17 Potential to Achieve Goals: Good    Frequency Min 3X/week   Barriers to discharge        Co-evaluation               AM-PAC PT "6 Clicks" Daily Activity  Outcome Measure Difficulty turning over in bed (including adjusting bedclothes, sheets and blankets)?: None Difficulty moving from lying on back to sitting on the side of the bed? : None Difficulty sitting down on and standing up from a chair with arms (e.g., wheelchair, bedside commode, etc,.)?: None Help needed moving to and from a bed to chair (including a wheelchair)?: A Little Help needed walking in hospital room?: A Little Help needed climbing 3-5 steps with a railing? : A Little 6 Click Score: 21    End of Session Equipment Utilized During Treatment: Gait belt Activity Tolerance: Patient tolerated treatment well Patient left: in chair;with call bell/phone within reach        Time: 4314-2767 PT Time Calculation (min) (ACUTE ONLY): 18 min   Charges:   PT Evaluation $PT Eval Moderate Complexity: 1 Mod     PT G Codes:          Weston Anna, MPT Pager: [phone]

## 2017-08-06 NOTE — Evaluation (Signed)
Clinical/Bedside Swallow Evaluation Patient Details  Name: Peter Wolfe MRN: [id number] Date of Birth: 09-27-1929  Today's Date: 08/06/2017 Time: SLP Start Time (ACUTE ONLY): 8757 SLP Stop Time (ACUTE ONLY): 1031 SLP Time Calculation (min) (ACUTE ONLY): 16 min  Past Medical History:  Past Medical History:  Diagnosis Date  . BPH (benign prostatic hyperplasia)   . Broken rib   . COPD (chronic obstructive pulmonary disease) ([place])   . GERD (gastroesophageal reflux disease)   . GERD (gastroesophageal reflux disease)   . Hyperlipidemia   . Hypertension   . Insomnia   . Panic attack    Past Surgical History:  Past Surgical History:  Procedure Laterality Date  . CHOLECYSTECTOMY     HPI:  83 year old male with history of multiple myeloma recently started on chemotherapy presented with shortness of breath on 08/03/2017.  He was admitted with pneumonia and started on IV antibiotics  Per Md notes, pt continues with cough/expectoration of secretions.  CT chest 08/04/2017 concerning for Bilateral lower lobe pneumonia (favored over atelectasis), bilateral pleural effusions and large hiatal hernia.    Swallow evaluation ordered by MD.     Assessment / Plan / Recommendation Clinical Impression  Pt presents with functional oropharyngeal swallow ability.  No indication of airway compromise noted with po intake and CN exam negative.  Of note, pt does have a large hiatal hernia per imaging studies and reports he takes reflux medications.  Pt passed 3 ounce water test with no deficits.  He admits to shortness of breath and xerostomia.  Provided him with dysphagia mitigation strategies with respiratory deficits and advised him to monitor his reflux.       Aspiration Risk  No limitations    Diet Recommendation Regular;Thin liquid   Liquid Administration via: Cup;Straw    Other  Recommendations   n/a  Follow up Recommendations None      Frequency and Duration     n/a       Prognosis     n/a    Swallow Study   General Date of Onset: 08/06/17 HPI: 83 year old male with history of multiple myeloma recently started on chemotherapy presented with shortness of breath on 08/03/2017.  He was admitted with pneumonia and started on IV antibiotics  Per Md notes, pt continues with cough/expectoration of secretions.  CT chest 08/04/2017 concerning for Bilateral lower lobe pneumonia (favored over atelectasis), bilateral pleural effusions and large hiatal hernia.    Swallow evaluation ordered by MD.   Respiratory Status: Room air History of Recent Intubation: No Behavior/Cognition: Alert Oral Care Completed by SLP: No Oral Cavity - Dentition: Other (Comment)(partials) Vision: Functional for self-feeding Baseline Vocal Quality: Normal Volitional Cough: Strong Volitional Swallow: Able to elicit    Oral/Motor/Sensory Function Overall Oral Motor/Sensory Function: Within functional limits   Ice Chips Ice chips: Not tested   Thin Liquid Thin Liquid: Within functional limits Presentation: Cup;Straw    Nectar Thick Nectar Thick Liquid: Not tested   Honey Thick Honey Thick Liquid: Not tested   Puree Puree: Within functional limits Presentation: Self Fed;Spoon   Solid   GO   Solid: Within functional limits Presentation: Self Fredirick Lathe 08/06/2017,10:38 AM   Luanna Salk, [facility] SLP [phone]

## 2017-08-06 NOTE — Care Management Important Message (Signed)
Important Message  Patient Details  Name: Peter Wolfe MRN: [id number] Date of Birth: 12-05-1928   Medicare Important Message Given:  Yes    Kerin Salen 08/06/2017, 10:04 AMImportant Message  Patient Details  Name: Peter Wolfe MRN: [id number] Date of Birth: [birth year]   Medicare Important Message Given:  Yes    Kerin Salen 08/06/2017, 10:03 AM

## 2017-08-06 NOTE — Progress Notes (Signed)
IP PROGRESS NOTE  Subjective:  Patient remains afebrile.  Respiratory status is stable.  He reports feeling quite well today.  Objective: Vital signs in last 24 hours: Blood pressure (!) 105/58, pulse 80, temperature 98.1 F (36.7 C), temperature source Oral, resp. rate 16, height _0  (1.651 m), weight 136 lb (61.7 kg), SpO2 95 %.  Intake/Output from previous day: 11/07 0701 - 11/08 0700 In: 996.7 [P.O.:540; IV Piggyback:456.7] Out: 3500 [Urine:3500]  Physical Exam: Deferred  Portacath/PICC-without erythema  Lab Results: Recent Labs    08/05/17 0545 08/06/17 0557  WBC 6.0 3.7*  HGB 8.0* 9.1*  HCT 24.6* 27.7*  PLT 116* 150    BMET Recent Labs    08/05/17 0545 08/06/17 0557  NA 135 137  K 4.1 3.7  CL 101 101  CO2 29 29  GLUCOSE 98 103*  BUN 22* 26*  CREATININE 1.32* 1.35*  CALCIUM 8.1* 8.4*    No results found for: CEA1  Studies/Results: Dg Chest 2 View  Result Date: 08/06/2017 CLINICAL DATA:  83 year old male currently admitted with pneumonia, shortness of breath. EXAM: CHEST  2 VIEW COMPARISON:  Chest x-ray and CT scan of the chest 08/03/2017 FINDINGS: Stable cardiac and mediastinal contours. Atherosclerotic calcifications noted throughout the thoracic aorta. Ectatic ascending aortic contour. A large hiatal hernia. Linear opacities in the left lung base consistent with atelectasis. There are persistent small bilateral layering pleural effusions. No pulmonary edema. No pneumothorax. No acute osseous abnormality. IMPRESSION: 1. Similar appearance of small bilateral layering pleural effusions and associated left greater than right basilar atelectasis. Electronically Signed   By: Jacqulynn Cadet M.D.   On: 08/06/2017 09:20    Medications: I have reviewed the patient's current medications.  Assessment/Plan: 83 y.o. male with diagnosis of active multiple myeloma, recently having started systemic therapy with bortezomib and low-dose dexamethasone.  Tolerated  initial treatment without complications, but developed healthcare associated pneumonia at this time.  Appears to feel significantly better with instituted respiratory therapy and antibiotics.  Patient continues to improve.  No recurrent fevers or chills.  His pituitary status remains stable. Patient has improving red blood cell count and platelets consistent with early disease response.  White blood cell count is slightly down, but patient is not neutropenic at this time.  Recommendations: --Antibiotic management and discharge at the discretion of the primary treating service.   --I will see patient back in my clinic next week to resume his systemic therapy.   LOS: 3 days   Ardath Sax, MD   08/06/2017, 5:44 PM

## 2017-08-06 NOTE — Progress Notes (Addendum)
Patient ID: Peter Wolfe, male   DOB: 1928-10-29, 83 y.o.   MRN: [id number]  PROGRESS NOTE    VANDERBILT RANIERI III  ENI:[id number] DOB: [birth year] DOA: 08/03/2017 PCP: [name], NP   Brief Narrative:  83 year old male with history of multiple myeloma recently started on chemotherapy presented with shortness of breath. He was admitted with pneumonia and started on IV antibiotics   Assessment & Plan:   Active Problems:   Essential hypertension, benign   CKD (chronic kidney disease), stage III ([place])   Multiple myeloma ([place])   HCAP (healthcare-associated pneumonia)   Anemia   Pneumonia   Healthcare associated pneumonia with concern for gram-negative versus MRSA pneumonia - Cultures negative so far. Discontinue vancomycin. Continue cefepime.  - Oxygen supplementation if needed    Possible fluid overload - Echo showed ejection fraction of 55-60%. Continue Lasix. - Repeat chest x-ray from today shows mild bilateral pleural effusion  Chronic kidney disease stage III - Renal function stable. Monitor  Essential hypertension Continue Lasix and clonidine. Monitor blood pressure  History of multiple myeloma Status post recent chemotherapy. Oncology following  Macrocytic anemia No evidence for overt bleeding.  Transfuse if hemoglobin drops below 7.  Hyponatremia and hypokalemia and hypomagnesemia and hypophosphatemia - Improved. Monitor  Ectatic ascending thoracic aorta with maximum diameter 4.0 cm.  Seen incidentally on CT chest. Recommend annual imaging followup by CTA or MRA  Non severe, moderate malnutrition in the context of chronic illness -Follow nutrition recommendations   DVT prophylaxis: SCDs Code Status:  DO NOT RESUSCITATE Family Communication: None at bedside Disposition Plan: Home in 1-3 days  Consultants: Oncology  Procedures:  Echo on 08/04/2017 Study Conclusions  - Left ventricle: The cavity size was normal. Wall thickness  was   increased in a pattern of mild LVH. Systolic function was normal.   The estimated ejection fraction was in the range of 55% to 60%.   Wall motion was normal; there were no regional wall motion   abnormalities. Doppler parameters are consistent with abnormal   left ventricular relaxation (grade 1 diastolic dysfunction). - Aortic valve: There was no stenosis. - Aorta: Borderline dilated aortic root. Aortic root dimension: 37   mm (ED). - Mitral valve: Mildly calcified annulus. There was trivial   regurgitation. - Left atrium: The atrium was mildly dilated. - Right ventricle: The cavity size was normal. Systolic function   was normal. - Right atrium: The atrium was mildly dilated. - Tricuspid valve: Peak RV-RA gradient (S): 40 mm Hg. - Pulmonary arteries: PA peak pressure: 43 mm Hg (S). - Inferior vena cava: The vessel was normal in size. The   respirophasic diameter changes were in the normal range (>= 50%),   consistent with normal central venous pressure.  Impressions:  - Normal LV size with mild LV hypertrophy. EF 55-60%. Normal RV   size and systolic function. Mild pulmonary hypertension.   Antimicrobials: Cefepime and vancomycin from 08/03/2017   Subjective: Patient seen and examined at bedside. He feels better but he is still weak and coughing. His appetite is still poor. No overnight fever or vomiting.   Objective: Vitals:   08/05/17 1427 08/05/17 2108 08/06/17 0413 08/06/17 0722  BP: 115/63 136/67 139/70   Pulse: 71 72 68   Resp: _0 Temp: 98.2 F (36.8 C) 98.3 F (36.8 C) 97.7 F (36.5 C)   TempSrc: Oral Oral Oral   SpO2: 99% 100% 96%   Weight:    61.7 kg (  136 lb)  Height:    _0  (1.651 m)    Intake/Output Summary (Last 24 hours) at 08/06/2017 1021 Last data filed at 08/05/2017 2155 Gross per 24 hour  Intake 996.67 ml  Output 3500 ml  Net -2503.33 ml   Filed Weights   08/06/17 0722  Weight: 61.7 kg (136 lb)    Examination:  General  exam: Appears calm and comfortable  Respiratory system: Bilateral decreased breath sound at bases with some scattered crackles Cardiovascular system: S1 & S2 heard, rate controlled  Gastrointestinal system: Abdomen is nondistended, soft and nontender. Normal bowel sounds heard. Extremities: No cyanosis, clubbing; trace edema   Data Reviewed: I have personally reviewed following labs and imaging studies  CBC: Recent Labs  Lab 08/03/17 1531 08/04/17 0146 08/05/17 0545 08/06/17 0557  WBC 6.6 5.0 6.0 3.7*  NEUTROABS 5.9  --   --  2.3  HGB 7.8* 7.7* 8.0* 9.1*  HCT 23.3* 22.7* 24.6* 27.7*  MCV 101.7* 100.9* 103.8* 103.0*  PLT 100* 106* 116* 875   Basic Metabolic Panel: Recent Labs  Lab 08/03/17 1531 08/04/17 0146 08/05/17 0545 08/06/17 0557  NA 132* 131* 135 137  K 3.4* 3.4* 4.1 3.7  CL 100* 97* 101 101  CO2 _1 GLUCOSE 161* 199* 98 103*  BUN 19 19 22* 26*  CREATININE 1.30* 1.25* 1.32* 1.35*  CALCIUM 8.5* 8.1* 8.1* 8.4*  MG  --  1.6* 2.0 1.9  PHOS  --  <1.0* 2.1* 2.8   GFR: Estimated Creatinine Clearance: 34.2 mL/min (A) (by C-G formula based on SCr of 1.35 mg/dL (H)). Liver Function Tests: Recent Labs  Lab 08/03/17 1531 08/04/17 0146  AST 18 16  ALT 12* 12*  ALKPHOS 56 50  BILITOT 0.8 0.6  PROT 9.4* 9.1*  ALBUMIN 2.8* 2.6*   No results for input(s): LIPASE, AMYLASE in the last 168 hours. No results for input(s): AMMONIA in the last 168 hours. Coagulation Profile: No results for input(s): INR, PROTIME in the last 168 hours. Cardiac Enzymes: Recent Labs  Lab 08/03/17 1531 08/03/17 2215  TROPONINI <0.03 <0.03   BNP (last 3 results) No results for input(s): PROBNP in the last 8760 hours. HbA1C: No results for input(s): HGBA1C in the last 72 hours. CBG: No results for input(s): GLUCAP in the last 168 hours. Lipid Profile: No results for input(s): CHOL, HDL, LDLCALC, TRIG, CHOLHDL, LDLDIRECT in the last 72 hours. Thyroid Function Tests: Recent  Labs    08/04/17 0146  TSH 0.763   Anemia Panel: No results for input(s): VITAMINB12, FOLATE, FERRITIN, TIBC, IRON, RETICCTPCT in the last 72 hours. Sepsis Labs: No results for input(s): PROCALCITON, LATICACIDVEN in the last 168 hours.  Recent Results (from the past 240 hour(s))  Culture, blood (routine x 2) Call MD if unable to obtain prior to antibiotics being given     Status: None (Preliminary result)   Collection Time: 08/04/17  1:46 AM  Result Value Ref Range Status   Specimen Description BLOOD LEFT ANTECUBITAL  Final   Special Requests   Final    BOTTLES DRAWN AEROBIC AND ANAEROBIC Blood Culture adequate volume   Culture   Final    NO GROWTH 1 DAY Performed at [facility], [address] [place], [place] [address]    Report Status PENDING  Incomplete  Culture, blood (routine x 2) Call MD if unable to obtain prior to antibiotics being given     Status: None (Preliminary result)   Collection  Time: 08/04/17  2:20 AM  Result Value Ref Range Status   Specimen Description BLOOD RIGHT HAND  Final   Special Requests   Final    BOTTLES DRAWN AEROBIC ONLY Blood Culture adequate volume   Culture   Final    NO GROWTH 1 DAY Performed at [facility], [address], [place], [place] [address]    Report Status PENDING  Incomplete  MRSA PCR Screening     Status: None   Collection Time: 08/05/17 10:16 AM  Result Value Ref Range Status   MRSA by PCR NEGATIVE NEGATIVE Final    Comment:        The GeneXpert MRSA Assay (FDA approved for NASAL specimens only), is one component of a comprehensive MRSA colonization surveillance program. It is not intended to diagnose MRSA infection nor to guide or monitor treatment for MRSA infections.          Radiology Studies: Dg Chest 2 View  Result Date: 08/06/2017 CLINICAL DATA:  83 year old male currently admitted with pneumonia, shortness of breath. EXAM: CHEST  2 VIEW COMPARISON:  Chest x-ray and CT scan of the chest  08/03/2017 FINDINGS: Stable cardiac and mediastinal contours. Atherosclerotic calcifications noted throughout the thoracic aorta. Ectatic ascending aortic contour. A large hiatal hernia. Linear opacities in the left lung base consistent with atelectasis. There are persistent small bilateral layering pleural effusions. No pulmonary edema. No pneumothorax. No acute osseous abnormality. IMPRESSION: 1. Similar appearance of small bilateral layering pleural effusions and associated left greater than right basilar atelectasis. Electronically Signed   By: Jacqulynn Cadet M.D.   On: 08/06/2017 09:20        Scheduled Meds: . acyclovir  400 mg Oral BID  . busPIRone  30 mg Oral Daily  . cloNIDine  0.1 mg Oral Daily  . FLUoxetine  40 mg Oral Daily  . furosemide  40 mg Intravenous Daily  . guaiFENesin  600 mg Oral BID  . mirtazapine  45 mg Oral QHS  . pantoprazole  40 mg Oral Daily  . senna  1 tablet Oral BID  . simvastatin  10 mg Oral QHS  . tamsulosin  0.4 mg Oral Daily  . traZODone  50 mg Oral QHS   Continuous Infusions: . ceFEPime (MAXIPIME) IV Stopped (08/06/17 0703)     LOS: 3 days        Aline August, MD [facility] Pager [phone]  If 7PM-7AM, please contact night-coverage [email or web address] Password [email or web address] 08/06/2017, 10:21 AM

## 2017-08-06 NOTE — Progress Notes (Signed)
Date: August 06, 2017 Rhonda Davis, BSN, RN3, CCM  [phone] Chart and notes review for patient progress and needs. Will follow for case management and discharge needs. Next review date: [id number] 

## 2017-08-07 DIAGNOSIS — I10 Essential (primary) hypertension: Secondary | ICD-10-CM

## 2017-08-07 LAB — CBC WITH DIFFERENTIAL/PLATELET
Basophils Absolute: 0 10*3/uL (ref 0.0–0.1)
Basophils Relative: 0 %
EOS ABS: 0 10*3/uL (ref 0.0–0.7)
EOS PCT: 0 %
HCT: 25.2 % — ABNORMAL LOW (ref 39.0–52.0)
Hemoglobin: 8.3 g/dL — ABNORMAL LOW (ref 13.0–17.0)
LYMPHS ABS: 1.1 10*3/uL (ref 0.7–4.0)
LYMPHS PCT: 36 %
MCH: 33.7 pg (ref 26.0–34.0)
MCHC: 32.9 g/dL (ref 30.0–36.0)
MCV: 102.4 fL — AB (ref 78.0–100.0)
MONO ABS: 0.3 10*3/uL (ref 0.1–1.0)
Monocytes Relative: 12 %
Neutro Abs: 1.5 10*3/uL — ABNORMAL LOW (ref 1.7–7.7)
Neutrophils Relative %: 52 %
PLATELETS: 141 10*3/uL — AB (ref 150–400)
RBC: 2.46 MIL/uL — AB (ref 4.22–5.81)
RDW: 14.4 % (ref 11.5–15.5)
WBC: 2.9 10*3/uL — AB (ref 4.0–10.5)

## 2017-08-07 LAB — BASIC METABOLIC PANEL
Anion gap: 7 (ref 5–15)
BUN: 26 mg/dL — AB (ref 6–20)
CO2: 29 mmol/L (ref 22–32)
CREATININE: 1.38 mg/dL — AB (ref 0.61–1.24)
Calcium: 8.4 mg/dL — ABNORMAL LOW (ref 8.9–10.3)
Chloride: 100 mmol/L — ABNORMAL LOW (ref 101–111)
GFR calc Af Amer: 52 mL/min — ABNORMAL LOW (ref >60)
GFR, EST NON AFRICAN AMERICAN: 45 mL/min — AB (ref >60)
GLUCOSE: 126 mg/dL — AB (ref 65–99)
Potassium: 3.2 mmol/L — ABNORMAL LOW (ref 3.5–5.1)
SODIUM: 136 mmol/L (ref 135–145)

## 2017-08-07 LAB — MAGNESIUM: MAGNESIUM: 1.7 mg/dL (ref 1.7–2.4)

## 2017-08-07 MED ORDER — CEFUROXIME AXETIL 500 MG PO TABS
500.0000 mg | ORAL_TABLET | Freq: Two times a day (BID) | ORAL | Status: DC
  Administered 2017-08-07: 500 mg via ORAL
  Filled 2017-08-07 (×2): qty 1

## 2017-08-07 MED ORDER — FUROSEMIDE 20 MG PO TABS: 20.0000 mg | ORAL_TABLET | Freq: Every day | ORAL | 0 refills | Status: DC

## 2017-08-07 MED ORDER — POLYETHYLENE GLYCOL 3350 17 G PO PACK: 17.0000 g | PACK | Freq: Every day | ORAL | 0 refills | Status: AC | PRN

## 2017-08-07 MED ORDER — CEFUROXIME AXETIL 500 MG PO TABS: 500.0000 mg | ORAL_TABLET | Freq: Two times a day (BID) | ORAL | 0 refills | Status: DC

## 2017-08-07 MED ORDER — POTASSIUM CHLORIDE CRYS ER 20 MEQ PO TBCR
80.0000 meq | EXTENDED_RELEASE_TABLET | Freq: Once | ORAL | Status: AC
  Administered 2017-08-07: 80 meq via ORAL
  Filled 2017-08-07: qty 4

## 2017-08-07 MED ORDER — GUAIFENESIN ER 600 MG PO TB12: 600.0000 mg | ORAL_TABLET | Freq: Two times a day (BID) | ORAL | 0 refills | Status: AC

## 2017-08-07 NOTE — Discharge Summary (Signed)
Physician Discharge Summary  Peter Wolfe BHA:[id number] DOB: Jan 29, 1929 DOA: 08/03/2017  PCP: [name], NP  Admit date: 08/03/2017 Discharge date: 08/07/2017  Admitted From: Home Disposition:  Home  Recommendations for Outpatient Follow-up:  1. Follow up with PCP in 1week with repeat CBC/BMP 2. Follow-up with oncology next week 3. Follow-up in the ED if symptoms worsen or new appear   Home Health: No  Equipment/Devices: None  Discharge Condition: Stable  CODE STATUS: DO NOT RESUSCITATE  Diet recommendation: Heart Healthy/diet as per SLP recommendations  Brief/Interim Summary: 83 year old male with history of multiple myeloma recently started on chemotherapy presented with shortness of breath. He was admitted with pneumonia and started on IV antibiotics. Cultures have been negative. Initially was started on cefepime and vancomycin; vancomycin was discontinued. His condition has much improved. Oncology has already evaluated the patient. He will be discharged on 5 more days of oral cefuroxime.   Discharge Diagnoses:  Active Problems:   Essential hypertension, benign   CKD (chronic kidney disease), stage Wolfe ([place])   Multiple myeloma ([place])   HCAP (healthcare-associated pneumonia)   Anemia   Pneumonia   Healthcare associated pneumonia  - Cultures negative so far. Initially started on cefepime and vancomycin. Vancomycin discontinued on 08/06/2017. Currently on cefepime.  - Discharge the patient home on oral cefuroxime for 5 more days  - Patient was not on home oxygen. Will check the patient for home oxygen requirement and arrange for home oxygen if needed  Possible fluid overload - Echo showed ejection fraction of 55-60%.  - Currently on IV Lasix and diuresing well. Switch to oral Lasix 20 mg daily for 10 days and follow-up with primary care provider regarding need for more Lasix. Patient might benefit from outpatient cardiology follow-up.  Chronic kidney  disease stage Wolfe - Renal function stable. Monitor  Essential hypertension Continue Lasix and clonidine. Monitor blood pressure. Outpatient follow-up  History of multiple myeloma Status post recent chemotherapy. Oncology following. Outpatient follow-up with oncology  Macrocytic anemia No evidence for overt bleeding. Outpatient follow-up  Hyponatremia and hypokalemia and hypomagnesemia and hypophosphatemia - Outpatient follow-up  Ectatic ascending thoracic aorta with maximum diameter 4.0 cm. Seen incidentally on CT chest.Recommend annual imaging followup by CTA or MRA  Non severe, moderate malnutrition in the context of chronic illness -Follow nutrition recommendations - Outpatient follow-up     Discharge Instructions  Discharge Instructions    Call MD for:  difficulty breathing, headache or visual disturbances   Complete by:  As directed    Call MD for:  extreme fatigue   Complete by:  As directed    Call MD for:  hives   Complete by:  As directed    Call MD for:  persistant dizziness or light-headedness   Complete by:  As directed    Call MD for:  persistant nausea and vomiting   Complete by:  As directed    Call MD for:  severe uncontrolled pain   Complete by:  As directed    Call MD for:  temperature >100.4   Complete by:  As directed    Diet - low sodium heart healthy   Complete by:  As directed    Increase activity slowly   Complete by:  As directed      Allergies as of 08/07/2017      Reactions   Influenza Virus Vacc Split Pf Other (See Comments)   Allergic to eggs   Doxycycline Rash   Eggs Or Egg-derived Products Rash  Medication List    STOP taking these medications   dexamethasone 4 MG tablet Commonly known as:  DECADRON   morphine 15 MG tablet Commonly known as:  MSIR     TAKE these medications   acyclovir 400 MG tablet Commonly known as:  ZOVIRAX Take 400 mg 2 (two) times daily by mouth.   aspirin 325 MG tablet Take 650  mg daily as needed by mouth (("BREATHING")).   busPIRone 30 MG tablet Commonly known as:  BUSPAR TAKE 1 TABLET DAILY   cefUROXime 500 MG tablet Commonly known as:  CEFTIN Take 1 tablet (500 mg total) 2 (two) times daily with a meal for 5 days by mouth.   cloNIDine 0.1 MG tablet Commonly known as:  CATAPRES TAKE 1 TABLET DAILY   FLUoxetine 40 MG capsule Commonly known as:  PROZAC TAKE ONE CAPSULE BY MOUTH ONCE DAILY   furosemide 20 MG tablet Commonly known as:  LASIX Take 1 tablet (20 mg total) daily for 10 days by mouth.   guaiFENesin 600 MG 12 hr tablet Commonly known as:  MUCINEX Take 1 tablet (600 mg total) 2 (two) times daily for 10 days by mouth.   mirtazapine 45 MG tablet Commonly known as:  REMERON TAKE ONE TABLET BY MOUTH AT BEDTIME   multivitamin capsule Take 1 capsule by mouth daily.   omeprazole 40 MG capsule Commonly known as:  PRILOSEC Take 1 capsule (40 mg total) by mouth daily.   ondansetron 8 MG tablet Commonly known as:  ZOFRAN Take 8 mg every 8 (eight) hours as needed by mouth. NAUSEA AND VOMITING   polyethylene glycol packet Commonly known as:  MIRALAX / GLYCOLAX Take 17 g daily as needed by mouth for mild constipation.   prochlorperazine 10 MG tablet Commonly known as:  COMPAZINE Take 10 mg daily as needed by mouth. NAUSEA AND VOMITING   simvastatin 10 MG tablet Commonly known as:  ZOCOR TAKE 1 TABLET AT BEDTIME   tamsulosin 0.4 MG Caps capsule Commonly known as:  FLOMAX TAKE 1 CAPSULE DAILY   traZODone 50 MG tablet Commonly known as:  DESYREL Take 50 mg by mouth at bedtime.      Follow-up Information    [name], NP. Schedule an appointment as soon as possible for a visit in 1 week(s).   Specialty:  Family Medicine Why:  with repeat cbc/bmp Contact information: [place] [address] [phone]        Ardath Sax, MD. Schedule an appointment as soon as possible for a visit in 1 week(s).    Specialty:  Hematology and Oncology Why:  follow up next week Contact information: [address] [place] [address] [phone]          Allergies  Allergen Reactions  . Influenza Virus Vacc Split Pf Other (See Comments)    Allergic to eggs  . Doxycycline Rash  . Eggs Or Egg-Derived Products Rash    Consultations:  Oncology   Procedures/Studies: Dg Chest 2 View  Result Date: 08/06/2017 CLINICAL DATA:  83 year old male currently admitted with pneumonia, shortness of breath. EXAM: CHEST  2 VIEW COMPARISON:  Chest x-ray and CT scan of the chest 08/03/2017 FINDINGS: Stable cardiac and mediastinal contours. Atherosclerotic calcifications noted throughout the thoracic aorta. Ectatic ascending aortic contour. A large hiatal hernia. Linear opacities in the left lung base consistent with atelectasis. There are persistent small bilateral layering pleural effusions. No pulmonary edema. No pneumothorax. No acute osseous abnormality. IMPRESSION: 1.  Similar appearance of small bilateral layering pleural effusions and associated left greater than right basilar atelectasis. Electronically Signed   By: Jacqulynn Cadet M.D.   On: 08/06/2017 09:20   Dg Chest 2 View  Result Date: 08/03/2017 CLINICAL DATA:  Shortness of breath 5 days severe this morning. History multiple myeloma. EXAM: CHEST  2 VIEW COMPARISON:  12/31/2016. FINDINGS: Lungs are adequately inflated with mild opacification of the medial lung bases which may be due to vascular crowding although cannot exclude atelectasis/ infection. Small amount of pleural fluid posteriorly on the lateral film. Evidence of patient's moderate size hiatal hernia. Cardiomediastinal silhouette is within normal. There is calcified plaque over the aortic arch. Known severe T9 compression fracture. IMPRESSION: Mild opacification over the medial lung bases which may be due to vascular crowding versus atelectasis or infection. Small amount of pleural fluid  posteriorly on the lateral film. Moderate size hiatal hernia. Known T9 compression fracture. Electronically Signed   By: Marin Olp M.D.   On: 08/03/2017 15:20   Ct Angio Chest Pe W/cm &/or Wo Cm  Result Date: 08/03/2017 CLINICAL DATA:  83 year old who is currently undergoing chemotherapy for multiple myeloma, presenting with acute onset of shortness of breath, productive cough and wheezing that began after chemotherapy 4 days ago. Current history of COPD. EXAM: CT ANGIOGRAPHY CHEST WITH CONTRAST TECHNIQUE: Multidetector CT imaging of the chest was performed using the standard protocol during bolus administration of intravenous contrast. Multiplanar CT image reconstructions and MIPs were obtained to evaluate the vascular anatomy. CONTRAST:  100 mL Isovue 370 IV. COMPARISON:  PET-CT 07/20/2017.  No prior chest CT. FINDINGS: Cardiovascular: Contrast opacification of pulmonary arteries is very good. No filling defects within either main pulmonary artery or their segmental branches in either lung to suggest pulmonary embolism. Heart mildly enlarged. Moderate-to-marked three-vessel coronary atherosclerosis. Mild aortic valvular calcification. Mild aortic annular calcification. Moderate to severe atherosclerosis involving the thoracic and upper abdominal aorta. Ectatic ascending thoracic aorta with maximum diameter approximating 4.0 cm. Mediastinum/Nodes: No pathologic mediastinal, hilar or axillary lymphadenopathy. Normal appearing gas-filled esophagus. Large hiatal hernia with greater than 3/4 of the stomach located within the chest. Small bilateral thyroid gland nodules, the largest approximately 8 mm arising from the lower pole of the left lobe. Lungs/Pleura: Emphysematous changes throughout both lungs. Airspace consolidation involving the lower lobes bilaterally with air bronchograms. Small to moderate-sized bilateral pleural effusions. Mild interstitial opacities with interlobular septal thickening. Scattered  areas of focal hyperlucency throughout both lungs. Central airways patent with mild bronchial wall thickening. Upper Abdomen: Visualized pancreas atrophic. Visualized upper abdomen otherwise unremarkable for the early arterial phase of enhancement. Musculoskeletal: Severe compression fracture involving the T9 vertebral body, unchanged. Remote healed fracture involving these mid sternal body. No acute fractures. Generalized osseous demineralization. Review of the MIP images confirms the above findings. IMPRESSION: 1. No evidence of pulmonary embolism. 2. Bilateral lower lobe pneumonia (favored over atelectasis) superimposed upon acute bronchitis and/or asthma. 3. Small to moderate-sized bilateral pleural effusions. 4. Very large hiatal hernia. 5. Moderate to marked three-vessel coronary atherosclerosis. 6. Ectatic ascending thoracic aorta with maximum diameter 4.0 cm. Recommend annual imaging followup by CTA or MRA. This recommendation follows 2010 ACCF/AHA/AATS/ACR/ASA/SCA/SCAI/SIR/STS/SVM Guidelines for the Diagnosis and Management of Patients with Thoracic Aortic Disease. Circulation. 2010; 121: I502-D741. Aortic Atherosclerosis (ICD10-I70.0). Emphysema (ICD10-J43.9). Aortic aneurysm NOS (ICD10-I71.9). Electronically Signed   By: Evangeline Dakin M.D.   On: 08/03/2017 19:50   Nm Pet Image Initial (pi) Whole Body  Result Date: 07/20/2017 CLINICAL DATA:  Initial treatment strategy for multiple myeloma. EXAM: NUCLEAR MEDICINE PET WHOLE BODY TECHNIQUE: 5.9 mCi F-18 FDG was injected intravenously. Full-ring PET imaging was performed from the vertex to the feet after the radiotracer. CT data was obtained and used for attenuation correction and anatomic localization. FASTING BLOOD GLUCOSE:  Value: 126 mg/dl COMPARISON:  CT scans of the spine dated 12/29/2016 ; CT abdomen from 09/19/2004 FINDINGS: HEAD/NECK No hypermetabolic activity in the scalp. No hypermetabolic cervical lymph nodes. Chronic right maxillary  sinusitis. Subtle accentuated activity along the upper margin of the right thyroid gland were there is a small focus of hypodensity at about 6 mm in diameter, maximum SUV 3.7. CHEST No hypermetabolic mediastinal or hilar nodes. No suspicious pulmonary nodules on the CT data. Large hiatal hernia containing most of the stomach. Mild atelectasis in the lingula, right middle lobe, and right lower lobe. Passive atelectasis in the right lower lobe adjacent to the hiatal hernia. ABDOMEN/PELVIS No abnormal hypermetabolic activity within the liver, pancreas, adrenal glands, or spleen. No hypermetabolic lymph nodes in the abdomen or pelvis. Aortoiliac atherosclerotic vascular disease. Cholecystectomy. Common bile duct 1.5 cm in diameter. Mildly accentuated activity in the left side of the prostate gland compared to the right, maximum SUV 4.4. Scattered physiologic activity in bowel. SKELETON Subacute fracture the right ninth rib laterally with focal mildly accentuated activity, probably benign, maximum SUV 3.9. The patient has a known T9 compression fracture with associated sclerosis but without significant accentuated metabolic activity at this level. Transverse fracture of the sternal body with low-grade associated metabolic activity likely from healing response, maximum SUV 3.2. Dextroconvex lumbar scoliosis with rotary component. Multilevel degenerative subluxations in the lumbar spine with prominent degenerative disc disease and underlying spondylosis, but no significantly accentuated metabolic activity to suggest underlying malignancy. There is some accentuated activity in the third metatarsal of the left foot which could be a manifestation of stress reaction but is technically nonspecific, consider dedicated radiography for further characterization. EXTREMITIES Fatty mass of the left upper vastus medialis muscle medially measuring 2.9 by 3.1 by 12.0 cm, with some faint internal stranding, and low-grade but accentuated  metabolic activity with maximum standard uptake value of approximately 3.1. IMPRESSION: 1. The only abnormal osseous activity is low-grade and appears to be associated with posttraumatic findings including a right rib fracture, a sternal fracture, and a T9 vertebral fracture with healing response. 2. This fatty mass of the medial aspect of the left upper vastus medialis muscle which has some faint internal stranding as well as accentuated metabolic activity (maximum SUV 3.1), suspicious for well differentiated liposarcoma. An SUV this high would be highly unusual for a lipoma. 3. Questionable thyroid nodule, with some faint focal accentuated activity along the right upper thyroid gland. Consider thyroid ultrasound for further assessment. A significant minority of hypermetabolic thyroid lesions may be malignant, but I am unsure that there is a nodule in the right upper thyroid lobe and the degree of activity, although focal, is only borderline increased. 4. Asymmetric accentuated activity in the left side of the prostate gland. Correlate with PSA level in assessing for prostate adenocarcinoma. 5. Other imaging findings of potential clinical significance: Chronic right maxillary sinusitis. Large hiatal hernia. Aortic Atherosclerosis (ICD10-I70.0). Dilated common bile duct at 1.5 cm in diameter, cause uncertain. Lumbar scoliosis, spondylosis, and degenerative disc disease. Electronically Signed   By: Van Clines M.D.   On: 07/20/2017 16:00   Ct Biopsy  Result Date: 07/21/2017 INDICATION: History of multiple myeloma. Please perform CT-guided bone marrow  biopsy for tissue diagnostic purposes. EXAM: CT-GUIDED BONE MARROW BIOPSY AND ASPIRATION MEDICATIONS: None ANESTHESIA/SEDATION: Fentanyl 75 mcg IV; Versed 1.5 mg IV Sedation Time: 10 minutes; The patient was continuously monitored during the procedure by the interventional radiology nurse under my direct supervision. COMPLICATIONS: None immediate. PROCEDURE:  Informed consent was obtained from the patient following an explanation of the procedure, risks, benefits and alternatives. The patient understands, agrees and consents for the procedure. All questions were addressed. A time out was performed prior to the initiation of the procedure. The patient was positioned prone and non-contrast localization CT was performed of the pelvis to demonstrate the iliac marrow spaces. The operative site was prepped and draped in the usual sterile fashion. Under sterile conditions and local anesthesia, a 22 gauge spinal needle was utilized for procedural planning. Next, an 11 gauge coaxial bone biopsy needle was advanced into the left iliac marrow space. Needle position was confirmed with CT imaging. Initially, bone marrow aspiration was performed. Next, a bone marrow biopsy was obtained with the 11 gauge outer bone marrow device. Samples were prepared with the cytotechnologist and deemed adequate. The needle was removed intact. Hemostasis was obtained with compression and a dressing was placed. The patient tolerated the procedure well without immediate post procedural complication. IMPRESSION: Successful CT guided left iliac bone marrow aspiration and core biopsy. Electronically Signed   By: Sandi Mariscal M.D.   On: 07/21/2017 12:51   Ct Bone Marrow Biopsy & Aspiration  Result Date: 07/21/2017 INDICATION: History of multiple myeloma. Please perform CT-guided bone marrow biopsy for tissue diagnostic purposes. EXAM: CT-GUIDED BONE MARROW BIOPSY AND ASPIRATION MEDICATIONS: None ANESTHESIA/SEDATION: Fentanyl 75 mcg IV; Versed 1.5 mg IV Sedation Time: 10 minutes; The patient was continuously monitored during the procedure by the interventional radiology nurse under my direct supervision. COMPLICATIONS: None immediate. PROCEDURE: Informed consent was obtained from the patient following an explanation of the procedure, risks, benefits and alternatives. The patient understands, agrees and  consents for the procedure. All questions were addressed. A time out was performed prior to the initiation of the procedure. The patient was positioned prone and non-contrast localization CT was performed of the pelvis to demonstrate the iliac marrow spaces. The operative site was prepped and draped in the usual sterile fashion. Under sterile conditions and local anesthesia, a 22 gauge spinal needle was utilized for procedural planning. Next, an 11 gauge coaxial bone biopsy needle was advanced into the left iliac marrow space. Needle position was confirmed with CT imaging. Initially, bone marrow aspiration was performed. Next, a bone marrow biopsy was obtained with the 11 gauge outer bone marrow device. Samples were prepared with the cytotechnologist and deemed adequate. The needle was removed intact. Hemostasis was obtained with compression and a dressing was placed. The patient tolerated the procedure well without immediate post procedural complication. IMPRESSION: Successful CT guided left iliac bone marrow aspiration and core biopsy. Electronically Signed   By: Sandi Mariscal M.D.   On: 07/21/2017 12:51    Echo on 08/04/2017 Study Conclusions  - Left ventricle: The cavity size was normal. Wall thickness was increased in a pattern of mild LVH. Systolic function was normal. The estimated ejection fraction was in the range of 55% to 60%. Wall motion was normal; there were no regional wall motion abnormalities. Doppler parameters are consistent with abnormal left ventricular relaxation (grade 1 diastolic dysfunction). - Aortic valve: There was no stenosis. - Aorta: Borderline dilated aortic root. Aortic root dimension: 37 mm (ED). - Mitral valve: Mildly  calcified annulus. There was trivial regurgitation. - Left atrium: The atrium was mildly dilated. - Right ventricle: The cavity size was normal. Systolic function was normal. - Right atrium: The atrium was mildly dilated. -  Tricuspid valve: Peak RV-RA gradient (S): 40 mm Hg. - Pulmonary arteries: PA peak pressure: 43 mm Hg (S). - Inferior vena cava: The vessel was normal in size. The respirophasic diameter changes were in the normal range (>= 50%), consistent with normal central venous pressure.  Impressions:  - Normal LV size with mild LV hypertrophy. EF 55-60%. Normal RV size and systolic function. Mild pulmonary hypertension.      Subjective: Patient seen and examined at bedside. He feels weak and tired but feels better and things that he can go home today. No overnight fever or vomiting. He still intermittently coughing.  Discharge Exam: Vitals:   08/06/17 2058 08/07/17 0500  BP: 127/68 130/70  Pulse: 81 72  Resp: 16 18  Temp: 98.3 F (36.8 C) 98.1 F (36.7 C)  SpO2: 93% 95%   Vitals:   08/06/17 0722 08/06/17 1300 08/06/17 2058 08/07/17 0500  BP:  (!) 105/58 127/68 130/70  Pulse:  80 81 72  Resp:   16 18  Temp:  98.1 F (36.7 C) 98.3 F (36.8 C) 98.1 F (36.7 C)  TempSrc:  Oral Oral Oral  SpO2:  95% 93% 95%  Weight: 61.7 kg (136 lb)     Height: 5' 5"  (1.651 m)       General: Pt is alert, awake, not in acute distress Cardiovascular: Rate controlled, S1/S2 + Respiratory: Bilateral decreased breath sounds at bases with some scattered crackles Abdominal: Soft, NT, ND, bowel sounds + Extremities: Trace edema, no cyanosis    The results of significant diagnostics from this hospitalization (including imaging, microbiology, ancillary and laboratory) are listed below for reference.     Microbiology: Recent Results (from the past 240 hour(s))  Culture, blood (routine x 2) Call MD if unable to obtain prior to antibiotics being given     Status: None (Preliminary result)   Collection Time: 08/04/17  1:46 AM  Result Value Ref Range Status   Specimen Description BLOOD LEFT ANTECUBITAL  Final   Special Requests   Final    BOTTLES DRAWN AEROBIC AND ANAEROBIC Blood Culture  adequate volume   Culture   Final    NO GROWTH 2 DAYS Performed at [facility], [address], [place], [place] [address]    Report Status PENDING  Incomplete  Culture, blood (routine x 2) Call MD if unable to obtain prior to antibiotics being given     Status: None (Preliminary result)   Collection Time: 08/04/17  2:20 AM  Result Value Ref Range Status   Specimen Description BLOOD RIGHT HAND  Final   Special Requests   Final    BOTTLES DRAWN AEROBIC ONLY Blood Culture adequate volume   Culture   Final    NO GROWTH 2 DAYS Performed at [facility], [address], [place], [place] [address]    Report Status PENDING  Incomplete  MRSA PCR Screening     Status: None   Collection Time: 08/05/17 10:16 AM  Result Value Ref Range Status   MRSA by PCR NEGATIVE NEGATIVE Final    Comment:        The GeneXpert MRSA Assay (FDA approved for NASAL specimens only), is one component of a comprehensive MRSA colonization surveillance program. It is not intended to diagnose MRSA infection  nor to guide or monitor treatment for MRSA infections.      Labs: BNP (last 3 results) Recent Labs    08/03/17 1531  BNP 144.8*   Basic Metabolic Panel: Recent Labs  Lab 08/03/17 1531 08/04/17 0146 08/05/17 0545 08/06/17 0557 08/07/17 0546  NA 132* 131* 135 137 136  K 3.4* 3.4* 4.1 3.7 3.2*  CL 100* 97* 101 101 100*  CO2 23 24 29 29 29   GLUCOSE 161* 199* 98 103* 126*  BUN 19 19 22* 26* 26*  CREATININE 1.30* 1.25* 1.32* 1.35* 1.38*  CALCIUM 8.5* 8.1* 8.1* 8.4* 8.4*  MG  --  1.6* 2.0 1.9 1.7  PHOS  --  <1.0* 2.1* 2.8  --    Liver Function Tests: Recent Labs  Lab 08/03/17 1531 08/04/17 0146  AST 18 16  ALT 12* 12*  ALKPHOS 56 50  BILITOT 0.8 0.6  PROT 9.4* 9.1*  ALBUMIN 2.8* 2.6*   No results for input(s): LIPASE, AMYLASE in the last 168 hours. No results for input(s): AMMONIA in the last 168 hours. CBC: Recent Labs  Lab 08/03/17 1531 08/04/17 0146 08/05/17 0545  08/06/17 0557 08/07/17 0546  WBC 6.6 5.0 6.0 3.7* 2.9*  NEUTROABS 5.9  --   --  2.3 1.5*  HGB 7.8* 7.7* 8.0* 9.1* 8.3*  HCT 23.3* 22.7* 24.6* 27.7* 25.2*  MCV 101.7* 100.9* 103.8* 103.0* 102.4*  PLT 100* 106* 116* 150 141*   Cardiac Enzymes: Recent Labs  Lab 08/03/17 1531 08/03/17 2215  TROPONINI <0.03 <0.03   BNP: Invalid input(s): POCBNP CBG: No results for input(s): GLUCAP in the last 168 hours. D-Dimer No results for input(s): DDIMER in the last 72 hours. Hgb A1c No results for input(s): HGBA1C in the last 72 hours. Lipid Profile No results for input(s): CHOL, HDL, LDLCALC, TRIG, CHOLHDL, LDLDIRECT in the last 72 hours. Thyroid function studies No results for input(s): TSH, T4TOTAL, T3FREE, THYROIDAB in the last 72 hours.  Invalid input(s): FREET3 Anemia work up No results for input(s): VITAMINB12, FOLATE, FERRITIN, TIBC, IRON, RETICCTPCT in the last 72 hours. Urinalysis    Component Value Date/Time   COLORURINE YELLOW 12/31/2016 1837   APPEARANCEUR HAZY (A) 12/31/2016 1837   LABSPEC 1.016 12/31/2016 1837   PHURINE 6.0 12/31/2016 1837   GLUCOSEU NEGATIVE 12/31/2016 1837   HGBUR NEGATIVE 12/31/2016 1837   HGBUR negative 06/13/2010 0000   BILIRUBINUR NEGATIVE 12/31/2016 1837   BILIRUBINUR n 07/31/2015 1224   KETONESUR 5 (A) 12/31/2016 1837   PROTEINUR NEGATIVE 12/31/2016 1837   UROBILINOGEN 1.0 07/31/2015 1224   UROBILINOGEN 2.0 06/13/2010 0000   NITRITE NEGATIVE 12/31/2016 1837   LEUKOCYTESUR TRACE (A) 12/31/2016 1837   Sepsis Labs Invalid input(s): PROCALCITONIN,  WBC,  LACTICIDVEN Microbiology Recent Results (from the past 240 hour(s))  Culture, blood (routine x 2) Call MD if unable to obtain prior to antibiotics being given     Status: None (Preliminary result)   Collection Time: 08/04/17  1:46 AM  Result Value Ref Range Status   Specimen Description BLOOD LEFT ANTECUBITAL  Final   Special Requests   Final    BOTTLES DRAWN AEROBIC AND ANAEROBIC Blood  Culture adequate volume   Culture   Final    NO GROWTH 2 DAYS Performed at [facility], [place], [place], [place] [address]    Report Status PENDING  Incomplete  Culture, blood (routine x 2) Call MD if unable to obtain prior to antibiotics being given     Status:  None (Preliminary result)   Collection Time: 08/04/17  2:20 AM  Result Value Ref Range Status   Specimen Description BLOOD RIGHT HAND  Final   Special Requests   Final    BOTTLES DRAWN AEROBIC ONLY Blood Culture adequate volume   Culture   Final    NO GROWTH 2 DAYS Performed at [facility], [address] [place], [place] [address]    Report Status PENDING  Incomplete  MRSA PCR Screening     Status: None   Collection Time: 08/05/17 10:16 AM  Result Value Ref Range Status   MRSA by PCR NEGATIVE NEGATIVE Final    Comment:        The GeneXpert MRSA Assay (FDA approved for NASAL specimens only), is one component of a comprehensive MRSA colonization surveillance program. It is not intended to diagnose MRSA infection nor to guide or monitor treatment for MRSA infections.      Time coordinating discharge: 35 minutes  SIGNED:   Aline August, MD  [facility] 08/07/2017, 10:24 AM Pager: [phone]  If 7PM-7AM, please contact night-coverage [email or web address] Password [email or web address]

## 2017-08-07 NOTE — Progress Notes (Signed)
SATURATION QUALIFICATIONS: (This note is used to comply with regulatory documentation for home oxygen)  Patient Saturations on Room Air at Rest = 95%  Patient Saturations on Room Air while Ambulating = >/=91%   

## 2017-08-07 NOTE — Plan of Care (Signed)
Patient given discharge instructions, and verbalized an understanding of all discharge instructions.  Patient agrees with discharge plan, and is being discharged in stable medical condition.  

## 2017-08-08 LAB — LEGIONELLA PNEUMOPHILA SEROGP 1 UR AG: L. pneumophila Serogp 1 Ur Ag: NEGATIVE

## 2017-08-09 LAB — CULTURE, BLOOD (ROUTINE X 2)
Culture: NO GROWTH
Culture: NO GROWTH
SPECIAL REQUESTS: ADEQUATE
SPECIAL REQUESTS: ADEQUATE

## 2017-08-10 ENCOUNTER — Telehealth: Payer: Self-pay

## 2017-08-10 ENCOUNTER — Encounter (HOSPITAL_COMMUNITY): Payer: Self-pay

## 2017-08-10 ENCOUNTER — Encounter: Payer: Self-pay | Admitting: Family Medicine

## 2017-08-10 ENCOUNTER — Telehealth: Payer: Self-pay | Admitting: *Deleted

## 2017-08-10 ENCOUNTER — Ambulatory Visit (INDEPENDENT_AMBULATORY_CARE_PROVIDER_SITE_OTHER): Payer: Medicare Other | Admitting: Family Medicine

## 2017-08-10 ENCOUNTER — Encounter: Payer: Self-pay | Admitting: Hematology and Oncology

## 2017-08-10 VITALS — BP 140/80 | HR 86 | Temp 98.3°F | Wt 134.3 lb

## 2017-08-10 DIAGNOSIS — N183 Chronic kidney disease, stage 3 unspecified: Secondary | ICD-10-CM

## 2017-08-10 DIAGNOSIS — J189 Pneumonia, unspecified organism: Secondary | ICD-10-CM

## 2017-08-10 DIAGNOSIS — C9000 Multiple myeloma not having achieved remission: Secondary | ICD-10-CM | POA: Diagnosis not present

## 2017-08-10 DIAGNOSIS — D649 Anemia, unspecified: Secondary | ICD-10-CM

## 2017-08-10 DIAGNOSIS — E876 Hypokalemia: Secondary | ICD-10-CM

## 2017-08-10 DIAGNOSIS — K5909 Other constipation: Secondary | ICD-10-CM | POA: Diagnosis not present

## 2017-08-10 NOTE — Progress Notes (Signed)
Subjective:     Patient ID: Peter Wolfe, male   DOB: [birth year], 83 y.o.   MRN: [id number]  HPI   Patient seen for hospital follow-up in the absence of his primary provider. He was diagnosis recent with multiple myeloma and started on chemotherapy couple of weeks ago.   Presented to hospital on the fifth of this month with dyspnea. Apparently had no fever. Was diagnosed with pneumonia (favored over atelectasis) by CT scan. Started on broad-spectrum antibiotics. He was discharged on Ceftin and has completed that course. Denies any cough. No fever. No dyspnea at this time.  Patient had potassium 3.2 prior to discharge. He had recent echocardiogram with ejection fraction 55-60%. He took 20 mg Lasix for 10 days. Denies any peripheral edema this time. Apparently has chronic kidney disease stage III with recent stable renal function. History of macrocytic anemia.  Patient has limited family support here and is planning to move back to [place] end of the month and will be continuing his treatment of myeloma there.  His main concern today is he states his had chronic constipation and no bowel movement for 1 week. He had slight loose bowel movement this morning but not a normal bowel movement. He's been taking some Colace and apparently some MiraLAX. Very poor diet and tends to eat a lot frozen dinners and other soft foods. Appears to be getting very little fiber  Past Medical History:  Diagnosis Date  . BPH (benign prostatic hyperplasia)   . Broken rib   . COPD (chronic obstructive pulmonary disease) ([place])   . GERD (gastroesophageal reflux disease)   . GERD (gastroesophageal reflux disease)   . Hyperlipidemia   . Hypertension   . Insomnia   . Panic attack    Past Surgical History:  Procedure Laterality Date  . CHOLECYSTECTOMY      reports that  has never smoked. he has never used smokeless tobacco. He reports that he drinks alcohol. He reports that he does not use drugs. family  history includes Alcohol abuse in his father; Alzheimer's disease in his sister; Diabetes in his sister; Emphysema in his father. Allergies  Allergen Reactions  . Influenza Virus Vacc Split Pf Other (See Comments)    Allergic to eggs  . Doxycycline Rash  . Eggs Or Egg-Derived Products Rash     Review of Systems  Constitutional: Negative for chills and fever.  HENT: Negative for trouble swallowing.   Respiratory: Negative for cough and shortness of breath.   Cardiovascular: Negative for chest pain.  Gastrointestinal: Positive for constipation. Negative for abdominal pain, nausea and vomiting.  Genitourinary: Negative for dysuria.  Neurological: Negative for dizziness and syncope.  Psychiatric/Behavioral: Negative for confusion.       Objective:   Physical Exam  Constitutional: He appears well-developed and well-nourished.  Cardiovascular: Normal rate and regular rhythm.  Pulmonary/Chest: Effort normal and breath sounds normal. No respiratory distress. He has no wheezes. He has no rales.  Abdominal: Soft. There is no tenderness. There is no rebound.  Genitourinary:  Genitourinary Comments: Rectal exam reveals no impaction. He has minimal somewhat liquidy brown stool in the rectal vault.       Assessment:     #1 multiple myeloma recently diagnosed and recently initiated therapy  #2 recent bilateral pneumonia clinically improved  #3 chronic constipation. No evidence for impaction on exam today  #4 hypokalemia by recent labs at hospital discharge  #5 chronic disease stage III  #6 macrocytic anemia    Plan:     -  Recheck basic metabolic panel and CBC -Discussed dietary measures to help with his low potassium. Now that he is off Lasix this hopefully will improve. -We discussed lifestyle and dietary management of constipation. Consider fiber supplement.  Eulas Post MD [facility]

## 2017-08-10 NOTE — Telephone Encounter (Signed)
Transition Care Management Follow-up Telephone Call  Per Discharge Summary:  Admit date: 08/03/2017 Discharge date: 08/07/2017  Admitted From: Home Disposition:  Home  Recommendations for Outpatient Follow-up:  1. Follow up with PCP in 1week with repeat CBC/BMP 2. Follow-up with oncology next week 3. Follow-up in the ED if symptoms worsen or new appear   Home Health: No  Equipment/Devices: None  Discharge Condition: Stable  CODE STATUS: DO NOT RESUSCITATE  Diet recommendation: Heart Healthy/diet as per SLP recommendations  --   How have you been since you were released from the hospital? "I'm severely constipated. I haven't had a BM since last Monday." He denies abd pain.   Do you understand why you were in the hospital? yes   Do you understand the discharge instructions? yes   Where were you discharged to? Home   Items Reviewed:  Medications reviewed: yes  Allergies reviewed: yes  Dietary changes reviewed: yes  Referrals reviewed: no, none made before   Functional Questionnaire:   Activities of Daily Living (ADLs):   He states they are independent in the following: ambulation, bathing and hygiene, feeding, continence, grooming, toileting and dressing States they require assistance with the following: none, but has torn achilles tendon and fractured ribs so needs extra time and walks with walker   Any transportation issues/concerns?: no   Any patient concerns? no   Confirmed importance and date/time of follow-up visits scheduled yes  Provider Appointment booked with Dr. Elease Hashimoto 08/10/17 @ 3:00pm  Confirmed with patient if condition begins to worsen call PCP or go to the ER.  Patient was given the office number and encouraged to call back with question or concerns.  : yes

## 2017-08-10 NOTE — Telephone Encounter (Signed)
Per Dr. Lebron Conners, patient needs to be scheduled for lab, MD visit, and Velcade this week. High priority scheduling message sent.

## 2017-08-10 NOTE — Patient Instructions (Signed)
Potassium Content of Foods Potassium is a mineral found in many foods and drinks. It helps keep fluids and minerals balanced in your body and affects how steadily your heart beats. Potassium also helps control your blood pressure and keep your muscles and nervous system healthy. Certain health conditions and medicines may change the balance of potassium in your body. When this happens, you can help balance your level of potassium through the foods that you do or do not eat. Your health care provider or dietitian may recommend an amount of potassium that you should have each day. The following lists of foods provide the amount of potassium (in parentheses) per serving in each item. High in potassium The following foods and beverages have 200 mg or more of potassium per serving:  Apricots, 2 raw or 5 dry (200 mg).  Artichoke, 1 medium (345 mg).  Avocado, raw,  each (245 mg).  Banana, 1 medium (425 mg).  Beans, lima, or baked beans, canned,  cup (280 mg).  Beans, white, canned,  cup (595 mg).  Beef roast, 3 oz (320 mg).  Beef, ground, 3 oz (270 mg).  Beets, raw or cooked,  cup (260 mg).  Bran muffin, 2 oz (300 mg).  Broccoli,  cup (230 mg).  Brussels sprouts,  cup (250 mg).  Cantaloupe,  cup (215 mg).  Cereal, 100% bran,  cup (200-400 mg).  Cheeseburger, single, fast food, 1 each (225-400 mg).  Chicken, 3 oz (220 mg).  Clams, canned, 3 oz (535 mg).  Crab, 3 oz (225 mg).  Dates, 5 each (270 mg).  Dried beans and peas,  cup (300-475 mg).  Figs, dried, 2 each (260 mg).  Fish: halibut, tuna, cod, snapper, 3 oz (480 mg).  Fish: salmon, haddock, swordfish, perch, 3 oz (300 mg).  Fish, tuna, canned 3 oz (200 mg).  [place] fries, fast food, 3 oz (470 mg).  Granola with fruit and nuts,  cup (200 mg).  Grapefruit juice,  cup (200 mg).  Greens, beet,  cup (655 mg).  Honeydew melon,  cup (200 mg).  Kale, raw, 1 cup (300 mg).  Kiwi, 1 medium (240  mg).  Kohlrabi, rutabaga, parsnips,  cup (280 mg).  Lentils,  cup (365 mg).  Mango, 1 each (325 mg).  Milk, chocolate, 1 cup (420 mg).  Milk: nonfat, low-fat, whole, buttermilk, 1 cup (350-380 mg).  Molasses, 1 Tbsp (295 mg).  Mushrooms,  cup (280) mg.  Nectarine, 1 each (275 mg).  Nuts: almonds, peanuts, hazelnuts, [place], cashew, mixed, 1 oz (200 mg).  Nuts, pistachios, 1 oz (295 mg).  Orange, 1 each (240 mg).  Orange juice,  cup (235 mg).  Papaya, medium,  fruit (390 mg).  Peanut butter, chunky, 2 Tbsp (240 mg).  Peanut butter, smooth, 2 Tbsp (210 mg).  Pear, 1 medium (200 mg).  Pomegranate, 1 whole (400 mg).  Pomegranate juice,  cup (215 mg).  Pork, 3 oz (350 mg).  Potato chips, salted, 1 oz (465 mg).  Potato, baked with skin, 1 medium (925 mg).  Potatoes, boiled,  cup (255 mg).  Potatoes, mashed,  cup (330 mg).  Prune juice,  cup (370 mg).  Prunes, 5 each (305 mg).  Pudding, chocolate,  cup (230 mg).  Pumpkin, canned,  cup (250 mg).  Raisins, seedless,  cup (270 mg).  Seeds, sunflower or pumpkin, 1 oz (240 mg).  Soy milk, 1 cup (300 mg).  Spinach,  cup (420 mg).  Spinach, canned,  cup (370 mg).  Sweet  potato, baked with skin, 1 medium (450 mg).  Swiss chard,  cup (480 mg).  Tomato or vegetable juice,  cup (275 mg).  Tomato sauce or puree,  cup (400-550 mg).  Tomato, raw, 1 medium (290 mg).  Tomatoes, canned,  cup (200-300 mg).  [place], 3 oz (250 mg).  Wheat germ, 1 oz (250 mg).  Winter squash,  cup (250 mg).  Yogurt, plain or fruited, 6 oz (260-435 mg).  Zucchini,  cup (220 mg).  Moderate in potassium The following foods and beverages have 50-200 mg of potassium per serving:  Apple, 1 each (150 mg).  Apple juice,  cup (150 mg).  Applesauce,  cup (90 mg).  Apricot nectar,  cup (140 mg).  Asparagus, small spears,  cup or 6 spears (155 mg).  Bagel, cinnamon raisin, 1 each (130 mg).  Bagel,  egg or plain, 4 in., 1 each (70 mg).  Beans, green,  cup (90 mg).  Beans, yellow,  cup (190 mg).  Beer, regular, 12 oz (100 mg).  Beets, canned,  cup (125 mg).  Blackberries,  cup (115 mg).  Blueberries,  cup (60 mg).  Bread, whole wheat, 1 slice (70 mg).  Broccoli, raw,  cup (145 mg).  Cabbage,  cup (150 mg).  Carrots, cooked or raw,  cup (180 mg).  Cauliflower, raw,  cup (150 mg).  Celery, raw,  cup (155 mg).  Cereal, bran flakes, cup (120-150 mg).  Cheese, cottage,  cup (110 mg).  Cherries, 10 each (150 mg).  Chocolate, 1 oz bar (165 mg).  Coffee, brewed 6 oz (90 mg).  Corn,  cup or 1 ear (195 mg).  Cucumbers,  cup (80 mg).  Egg, large, 1 each (60 mg).  Eggplant,  cup (60 mg).  Endive, raw, cup (80 mg).  English muffin, 1 each (65 mg).  Fish, orange roughy, 3 oz (150 mg).  Frankfurter, beef or pork, 1 each (75 mg).  Fruit cocktail,  cup (115 mg).  Grape juice,  cup (170 mg).  Grapefruit,  fruit (175 mg).  Grapes,  cup (155 mg).  Greens: kale, turnip, collard,  cup (110-150 mg).  Ice cream or frozen yogurt, chocolate,  cup (175 mg).  Ice cream or frozen yogurt, vanilla,  cup (120-150 mg).  Lemons, limes, 1 each (80 mg).  Lettuce, all types, 1 cup (100 mg).  Mixed vegetables,  cup (150 mg).  Mushrooms, raw,  cup (110 mg).  Nuts: walnuts, pecans, or macadamia, 1 oz (125 mg).  Oatmeal,  cup (80 mg).  Okra,  cup (110 mg).  Onions, raw,  cup (120 mg).  Peach, 1 each (185 mg).  Peaches, canned,  cup (120 mg).  Pears, canned,  cup (120 mg).  Peas, green, frozen,  cup (90 mg).  Peppers, green,  cup (130 mg).  Peppers, red,  cup (160 mg).  Pineapple juice,  cup (165 mg).  Pineapple, fresh or canned,  cup (100 mg).  Plums, 1 each (105 mg).  Pudding, vanilla,  cup (150 mg).  Raspberries,  cup (90 mg).  Rhubarb,  cup (115 mg).  Rice, wild,  cup (80 mg).  Shrimp, 3 oz (155  mg).  Spinach, raw, 1 cup (170 mg).  Strawberries,  cup (125 mg).  Summer squash  cup (175-200 mg).  Swiss chard, raw, 1 cup (135 mg).  Tangerines, 1 each (140 mg).  Tea, brewed, 6 oz (65 mg).  Turnips,  cup (140 mg).  Watermelon,  cup (85 mg).  Wine, red, table,  5 oz (180 mg).  Wine, white, table, 5 oz (100 mg).  Low in potassium The following foods and beverages have less than 50 mg of potassium per serving.  Bread, white, 1 slice (30 mg).  Carbonated beverages, 12 oz (less than 5 mg).  Cheese, 1 oz (20-30 mg).  Cranberries,  cup (45 mg).  Cranberry juice cocktail,  cup (20 mg).  Fats and oils, 1 Tbsp (less than 5 mg).  Hummus, 1 Tbsp (32 mg).  Nectar: papaya, mango, or pear,  cup (35 mg).  Rice, white or brown,  cup (50 mg).  Spaghetti or macaroni,  cup cooked (30 mg).  Tortilla, flour or corn, 1 each (50 mg).  Waffle, 4 in., 1 each (50 mg).  Water chestnuts,  cup (40 mg).  This information is not intended to replace advice given to you by your health care provider. Make sure you discuss any questions you have with your health care provider. Document Released: 04/29/2005 Document Revised: 02/21/2016 Document Reviewed: 08/12/2013 Elsevier Interactive Patient Education  2018 [employer]. Constipation, Adult Constipation is when a person has fewer bowel movements in a week than normal, has difficulty having a bowel movement, or has stools that are dry, hard, or larger than normal. Constipation may be caused by an underlying condition. It may become worse with age if a person takes certain medicines and does not take in enough fluids. Follow these instructions at home: Eating and drinking   Eat foods that have a lot of fiber, such as fresh fruits and vegetables, whole grains, and beans.  Limit foods that are high in fat, low in fiber, or overly processed, such as french fries, hamburgers, cookies, candies, and soda.  Drink enough fluid to  keep your urine clear or pale yellow. General instructions  Exercise regularly or as told by your health care provider.  Go to the restroom when you have the urge to go. Do not hold it in.  Take over-the-counter and prescription medicines only as told by your health care provider. These include any fiber supplements.  Practice pelvic floor retraining exercises, such as deep breathing while relaxing the lower abdomen and pelvic floor relaxation during bowel movements.  Watch your condition for any changes.  Keep all follow-up visits as told by your health care provider. This is important. Contact a health care provider if:  You have pain that gets worse.  You have a fever.  You do not have a bowel movement after 4 days.  You vomit.  You are not hungry.  You lose weight.  You are bleeding from the anus.  You have thin, pencil-like stools. Get help right away if:  You have a fever and your symptoms suddenly get worse.  You leak stool or have blood in your stool.  Your abdomen is bloated.  You have severe pain in your abdomen.  You feel dizzy or you faint. This information is not intended to replace advice given to you by your health care provider. Make sure you discuss any questions you have with your health care provider. Document Released: 06/13/2004 Document Revised: 04/04/2016 Document Reviewed: 03/05/2016 Elsevier Interactive Patient Education  2017 [employer].

## 2017-08-10 NOTE — Progress Notes (Signed)
Peter Wolfe Follow-up Visit:  Assessment: Multiple myeloma ([place]) 83 y.o. male with initial presentation of dysproteinemia with 3.9 g/dL of monoclonal protein in peripheral blood. On presentation, patient did have mild anemia, somewhat decreased renal function, no hypercalcemia, and early neuropathy.we conducted additional assessment demonstrating rising levels of clinical protein, protein was identified as IgG lambda. Patient has depressed levels of IgA and IgM with high levels of IgG exceeding 6 g/dL. Kappa to lambda ratio is 0.03, approaching diagnostic threshold for multiple myeloma by itself. Additional evaluation with PET/CT and bone marrow biopsy confirms presence of multiple myeloma.  At this time, patient is ready to initiate systemic therapy. This is advanced Asian performance test, were limited to using two agents at this time. Our preference is to proceed with treatment combining bortezomib and low-dose dexamethasone. Purpose of the treatment, scheduling ministration, and potential toxicities have been extensively reviewed with the patient. Patient consented to proceed with the treatment is recommended. Clinical evaluation liberty permissive to start therapy.  Plan: --Proceed with c1d1 bortezomib/dexamethasone -- next disease assessment with lab work prior to initiation of the next cycle of therapy.  --return to clinic in 1 weeks for toxicityassessment of dexamethasone and bortezomib  Voice recognition software was used and creation of this note. Despite my best effort at editing the text, some misspelling/errors may have occurred.   Orders Placed This Encounter  Procedures  . CBC with Differential    Standing Status:   Future    Standing Expiration Date:   07/30/2018  . Comprehensive metabolic panel    Standing Status:   Future    Standing Expiration Date:   07/30/2018  . Magnesium    Standing Status:   Future    Standing Expiration Date:   07/30/2018  .  Phosphorus    Standing Status:   Future    Standing Expiration Date:   07/30/2018    Wolfe Staging No matching staging information was found for the patient.  All questions were answered.  . The patient knows to call the clinic with any problems, questions or concerns.  This note was electronically signed.    History of Presenting Illness Peter Wolfe is an 83 y.o. male followed in the [facility] for multiple myeloma. Patient was found with total protein number on routine lab work and was referred for our evaluation after for pheresis demonstrated presence of monoclonal spike. Overall, patient reports feeling well. He denies any fevers, chills, night sweats. As any skeletal pain, but acknowledges some weight loss losing about 15 pounds from his baseline over the past year. He does have good appetite and reports eating abundant ice cream, without nausea, vomiting, abdominal pain, early satiety, diarrhea, but with some history of constipation. He denies any chest pain, shortness of breath or cough. Denies any urinary symptoms. He does acknowledge that he has been having difficulties with numbness and tingling in bilateral feet which is new for him. He has a history of years of [employer] service including working in [place] with possible [employer] exposure.  Patient returns to the clinic to consider initiation of systemic induction chemotherapy with bortezomib and low-dose dexamethasone. Patient reports continued constipation and some shortness of breath with activity over the past two days. No fevers no chills and no other new complaints.  Oncological/hematological History:    Multiple myeloma ([place])   03/24/2017 Tumor Marker    tProt 10.0, Alb 3.7, Ca 9.9, Cr 1.3, AP 42; SPEP -- M-Spike 3.9g/dL; WBC 4.4,  Hgb 10.2, Plt 158      06/18/2017 Tumor Marker    tProt 11.5, Alb 3.7, Ca 9.5, Cr 1.5, AP 45; LDH 127;  SPEP -- M-spike 4.1g/dL, IgG lambda by SIFE; IgG 6524, IgA 6, IgM 7;  kappa 4.4, lambda 171, KLR 0.03; UPEP -- 2m/24hrs  WBC 3.7, Hgb 11.2, Plt 128; Viscosity 2.3;       06/25/2017 Initial Diagnosis    Multiple myeloma ([facility])      07/30/2017 -  Chemotherapy    Bortezomib 1.360mm2, d1I2,0,35,59 Low-dose dexamethasone 1074mO QTue/Fri weekly Q28 days --Cycle #1, 07/30/17        Medical History: Past Medical History:  Diagnosis Date  . BPH (benign prostatic hyperplasia)   . Broken rib   . COPD (chronic obstructive pulmonary disease) ([facility] [place] GERD (gastroesophageal reflux disease)   . GERD (gastroesophageal reflux disease)   . Hyperlipidemia   . Hypertension   . Insomnia   . Panic attack     Surgical History: Past Surgical History:  Procedure Laterality Date  . CHOLECYSTECTOMY      Family History: Family History  Problem Relation Age of Onset  . Emphysema Father   . Alcohol abuse Father   . Diabetes Sister   . Alzheimer's disease Sister     Social History: Social History   Socioeconomic History  . Marital status: Single    Spouse name: Not on file  . Number of children: Not on file  . Years of education: Not on file  . Highest education level: Not on file  Social Needs  . Financial resource strain: Not on file  . Food insecurity - worry: Not on file  . Food insecurity - inability: Not on file  . Transportation needs - medical: Not on file  . Transportation needs - non-medical: Not on file  Occupational History  . Not on file  Tobacco Use  . Smoking status: Never Smoker  . Smokeless tobacco: Never Used  Substance and Sexual Activity  . Alcohol use: Yes    Alcohol/week: 0.0 oz    Comment: occasional  . Drug use: No  . Sexual activity: Not on file  Other Topics Concern  . Not on file  Social History Narrative   Retired - Sgt, Major    Not married           Allergies: Allergies  Allergen Reactions  . Influenza Virus Vacc Split Pf Other (See Comments)    Allergic to eggs  . Doxycycline Rash  . Eggs Or  Egg-Derived Products Rash    Medications:  Current Outpatient Medications  Medication Sig Dispense Refill  . acyclovir (ZOVIRAX) 400 MG tablet Take 400 mg 2 (two) times daily by mouth.    . aMarland Kitchenpirin 325 MG tablet Take 650 mg daily as needed by mouth (("BREATHING")).     . busPIRone (BUSPAR) 30 MG tablet TAKE 1 TABLET DAILY 100 tablet 3  . cloNIDine (CATAPRES) 0.1 MG tablet TAKE 1 TABLET DAILY 100 tablet 3  . FLUoxetine (PROZAC) 40 MG capsule TAKE ONE CAPSULE BY MOUTH ONCE DAILY 90 capsule 1  . furosemide (LASIX) 20 MG tablet Take 1 tablet (20 mg total) daily for 10 days by mouth. 10 tablet 0  . guaiFENesin (MUCINEX) 600 MG 12 hr tablet Take 1 tablet (600 mg total) 2 (two) times daily for 10 days by mouth. 20 tablet 0  . mirtazapine (REMERON) 45 MG tablet TAKE ONE TABLET BY MOUTH AT BEDTIME 90 tablet 1  .  Multiple Vitamin (MULTIVITAMIN) capsule Take 1 capsule by mouth daily.    Marland Kitchen omeprazole (PRILOSEC) 40 MG capsule Take 1 capsule (40 mg total) by mouth daily.    . ondansetron (ZOFRAN) 8 MG tablet Take 8 mg every 8 (eight) hours as needed by mouth. NAUSEA AND VOMITING    . polyethylene glycol (MIRALAX / GLYCOLAX) packet Take 17 g daily as needed by mouth for mild constipation. 14 each 0  . prochlorperazine (COMPAZINE) 10 MG tablet Take 10 mg daily as needed by mouth. NAUSEA AND VOMITING    . simvastatin (ZOCOR) 10 MG tablet TAKE 1 TABLET AT BEDTIME 100 tablet 3  . tamsulosin (FLOMAX) 0.4 MG CAPS capsule TAKE 1 CAPSULE DAILY 100 capsule 3  . traZODone (DESYREL) 50 MG tablet Take 50 mg by mouth at bedtime.     No current facility-administered medications for this visit.     Review of Systems: Review of Systems  Constitutional: Positive for unexpected weight change. Negative for appetite change, chills, diaphoresis, fatigue and fever.  HENT:  Negative.   Eyes: Negative.   Respiratory: Positive for shortness of breath.   Cardiovascular: Negative.   Gastrointestinal: Positive for  constipation.  Endocrine: Negative.   Genitourinary: Negative.    Musculoskeletal: Negative.  Negative for gait problem.  Skin: Positive for itching.  Neurological: Positive for numbness. Negative for dizziness, extremity weakness, gait problem, headaches, light-headedness, seizures and speech difficulty.  Hematological: Negative.   Psychiatric/Behavioral: Negative.      PHYSICAL EXAMINATION Blood pressure (!) 153/83, pulse 95, temperature 98.2 F (36.8 C), temperature source Oral, resp. rate 18, height _0  (1.651 m), weight 136 lb 3.2 oz (61.8 kg), SpO2 94 %.  ECOG PERFORMANCE STATUS: 2 - Symptomatic, <50% confined to bed  Physical Exam  Constitutional: He is oriented to person, place, and time and well-developed, well-nourished, and in no distress.  HENT:  Head: Normocephalic and atraumatic.  Mouth/Throat: Oropharynx is clear and moist. No oropharyngeal exudate.  Eyes: Pupils are equal, round, and reactive to light. No scleral icterus.  Neck: Normal range of motion. No thyromegaly present.  Cardiovascular: Normal rate, regular rhythm and normal heart sounds.  No murmur heard. Pulmonary/Chest: Effort normal and breath sounds normal. No respiratory distress. He has no wheezes. He has no rales.  Abdominal: Soft. Bowel sounds are normal. He exhibits no distension and no mass. There is no tenderness. There is no rebound.  Musculoskeletal: Normal range of motion. He exhibits no edema.  Lymphadenopathy:    He has no cervical adenopathy.  Neurological: He is alert and oriented to person, place, and time. He has normal reflexes. No cranial nerve deficit.  Skin: Skin is warm and dry. No rash noted. He is not diaphoretic. No erythema.     LABORATORY DATA: I have personally reviewed the data as listed: Appointment on 07/30/2017  Component Date Value Ref Range Status  . WBC 07/30/2017 4.1  4.0 - 10.3 10e3/uL Final  . NEUT# 07/30/2017 2.7  1.5 - 6.5 10e3/uL Final  . HGB 07/30/2017 9.2*  13.0 - 17.1 g/dL Final  . HCT 07/30/2017 27.5* 38.4 - 49.9 % Final  . Platelets 07/30/2017 114* 140 - 400 10e3/uL Final  . MCV 07/30/2017 103.0* 79.3 - 98.0 fL Final  . MCH 07/30/2017 34.6* 27.2 - 33.4 pg Final  . MCHC 07/30/2017 33.6  32.0 - 36.0 g/dL Final  . RBC 07/30/2017 2.67* 4.20 - 5.82 10e6/uL Final  . RDW 07/30/2017 15.7* 11.0 - 14.6 % Final  . lymph#  07/30/2017 0.9  0.9 - 3.3 10e3/uL Final  . MONO# 07/30/2017 0.5  0.1 - 0.9 10e3/uL Final  . Eosinophils Absolute 07/30/2017 0.0  0.0 - 0.5 10e3/uL Final  . Basophils Absolute 07/30/2017 0.0  0.0 - 0.1 10e3/uL Final  . NEUT% 07/30/2017 65.7  39.0 - 75.0 % Final  . LYMPH% 07/30/2017 22.0  14.0 - 49.0 % Final  . MONO% 07/30/2017 11.6  0.0 - 14.0 % Final  . EOS% 07/30/2017 0.3  0.0 - 7.0 % Final  . BASO% 07/30/2017 0.4  0.0 - 2.0 % Final  . Sodium 07/30/2017 139  136 - 145 mEq/L Final  . Potassium 07/30/2017 3.8  3.5 - 5.1 mEq/L Final  . Chloride 07/30/2017 107  98 - 109 mEq/L Final  . CO2 07/30/2017 24  22 - 29 mEq/L Final  . Glucose 07/30/2017 163* 70 - 140 mg/dl Final   Glucose reference range is for nonfasting patients. Fasting glucose reference range is 70- 100.  Marland Kitchen BUN 07/30/2017 20.7  7.0 - 26.0 mg/dL Final  . Creatinine 07/30/2017 1.3  0.7 - 1.3 mg/dL Final  . Total Bilirubin 07/30/2017 0.88  0.20 - 1.20 mg/dL Final  . Alkaline Phosphatase 07/30/2017 46  40 - 150 U/L Final  . AST 07/30/2017 14  5 - 34 U/L Final  . ALT 07/30/2017 10  0 - 55 U/L Final  . Total Protein 07/30/2017 9.6* 6.4 - 8.3 g/dL Final  . Albumin 07/30/2017 2.9* 3.5 - 5.0 g/dL Final  . Calcium 07/30/2017 8.7  8.4 - 10.4 mg/dL Final  . Anion Gap 07/30/2017 8  3 - 11 mEq/L Final  . EGFR 07/30/2017 50* >60 ml/min/1.73 m2 Final   eGFR is calculated using the CKD-EPI Creatinine Equation (2009)  . LDH 07/30/2017 126  125 - 245 U/L Final  . Magnesium 07/30/2017 1.7  1.5 - 2.5 mg/dl Final       Ardath Sax, MD

## 2017-08-10 NOTE — Assessment & Plan Note (Signed)
83 y.o. male with initial presentation of dysproteinemia with 3.9 g/dL of monoclonal protein in peripheral blood. On presentation, patient did have mild anemia, somewhat decreased renal function, no hypercalcemia, and early neuropathy.we conducted additional assessment demonstrating rising levels of clinical protein, protein was identified as IgG lambda. Patient has depressed levels of IgA and IgM with high levels of IgG exceeding 6 g/dL. Kappa to lambda ratio is 0.03, approaching diagnostic threshold for multiple myeloma by itself. Additional evaluation with PET/CT and bone marrow biopsy confirms presence of multiple myeloma.  At this time, patient is ready to initiate systemic therapy. This is advanced Asian performance test, were limited to using two agents at this time. Our preference is to proceed with treatment combining bortezomib and low-dose dexamethasone. Purpose of the treatment, scheduling ministration, and potential toxicities have been extensively reviewed with the patient. Patient consented to proceed with the treatment is recommended. Clinical evaluation liberty permissive to start therapy.  Plan: --Proceed with c1d1 bortezomib/dexamethasone -- next disease assessment with lab work prior to initiation of the next cycle of therapy.

## 2017-08-11 ENCOUNTER — Telehealth: Payer: Self-pay | Admitting: Hematology and Oncology

## 2017-08-11 LAB — BASIC METABOLIC PANEL
BUN: 15 mg/dL (ref 6–23)
CALCIUM: 8.8 mg/dL (ref 8.4–10.5)
CO2: 28 mEq/L (ref 19–32)
Chloride: 95 mEq/L — ABNORMAL LOW (ref 96–112)
Creatinine, Ser: 1.36 mg/dL (ref 0.40–1.50)
GFR: 52.75 mL/min — AB (ref >60.00)
GLUCOSE: 120 mg/dL — AB (ref 70–99)
POTASSIUM: 3.8 meq/L (ref 3.5–5.1)
SODIUM: 130 meq/L — AB (ref 135–145)

## 2017-08-11 LAB — CBC WITH DIFFERENTIAL/PLATELET
BASOS PCT: 0.7 % (ref 0.0–3.0)
Basophils Absolute: 0 10*3/uL (ref 0.0–0.1)
EOS ABS: 0 10*3/uL (ref 0.0–0.7)
Eosinophils Relative: 0.4 % (ref 0.0–5.0)
HCT: 29.4 % — ABNORMAL LOW (ref 39.0–52.0)
Hemoglobin: 9.9 g/dL — ABNORMAL LOW (ref 13.0–17.0)
LYMPHS ABS: 0.9 10*3/uL (ref 0.7–4.0)
Lymphocytes Relative: 27.3 % (ref 12.0–46.0)
MCHC: 33.7 g/dL (ref 30.0–36.0)
MCV: 102.8 fl — ABNORMAL HIGH (ref 78.0–100.0)
MONO ABS: 0.6 10*3/uL (ref 0.1–1.0)
MONOS PCT: 18.9 % — AB (ref 3.0–12.0)
NEUTROS ABS: 1.7 10*3/uL (ref 1.4–7.7)
NEUTROS PCT: 52.7 % (ref 43.0–77.0)
PLATELETS: 188 10*3/uL (ref 150.0–400.0)
RBC: 2.86 Mil/uL — ABNORMAL LOW (ref 4.22–5.81)
RDW: 15.5 % (ref 11.5–15.5)
WBC: 3.2 10*3/uL — AB (ref 4.0–10.5)

## 2017-08-11 NOTE — Telephone Encounter (Signed)
Left message for patient regarding appt added per 11/12 sch msg. Added per Mary's OK.

## 2017-08-12 ENCOUNTER — Telehealth: Payer: Self-pay | Admitting: Hematology and Oncology

## 2017-08-12 ENCOUNTER — Emergency Department (HOSPITAL_COMMUNITY)
Admission: EM | Admit: 2017-08-12 | Discharge: 2017-08-12 | Disposition: A | Discharge status: Home / Self Care | Payer: Medicare Other | Attending: Emergency Medicine | Admitting: Emergency Medicine

## 2017-08-12 ENCOUNTER — Emergency Department (HOSPITAL_COMMUNITY): Payer: Medicare Other

## 2017-08-12 ENCOUNTER — Encounter (HOSPITAL_COMMUNITY): Payer: Self-pay | Admitting: *Deleted

## 2017-08-12 ENCOUNTER — Other Ambulatory Visit: Payer: Self-pay

## 2017-08-12 DIAGNOSIS — K5901 Slow transit constipation: Secondary | ICD-10-CM | POA: Insufficient documentation

## 2017-08-12 DIAGNOSIS — K5900 Constipation, unspecified: Secondary | ICD-10-CM | POA: Diagnosis present

## 2017-08-12 DIAGNOSIS — Z79899 Other long term (current) drug therapy: Secondary | ICD-10-CM | POA: Diagnosis not present

## 2017-08-12 DIAGNOSIS — J449 Chronic obstructive pulmonary disease, unspecified: Secondary | ICD-10-CM | POA: Diagnosis not present

## 2017-08-12 DIAGNOSIS — C9000 Multiple myeloma not having achieved remission: Secondary | ICD-10-CM | POA: Diagnosis not present

## 2017-08-12 DIAGNOSIS — Z7982 Long term (current) use of aspirin: Secondary | ICD-10-CM | POA: Insufficient documentation

## 2017-08-12 DIAGNOSIS — I129 Hypertensive chronic kidney disease with stage 1 through stage 4 chronic kidney disease, or unspecified chronic kidney disease: Secondary | ICD-10-CM | POA: Diagnosis not present

## 2017-08-12 DIAGNOSIS — N183 Chronic kidney disease, stage 3 (moderate): Secondary | ICD-10-CM | POA: Diagnosis not present

## 2017-08-12 DIAGNOSIS — K449 Diaphragmatic hernia without obstruction or gangrene: Secondary | ICD-10-CM | POA: Diagnosis not present

## 2017-08-12 LAB — BASIC METABOLIC PANEL
Anion gap: 8 (ref 5–15)
BUN: 10 mg/dL (ref 6–20)
CHLORIDE: 94 mmol/L — AB (ref 101–111)
CO2: 26 mmol/L (ref 22–32)
CREATININE: 1.25 mg/dL — AB (ref 0.61–1.24)
Calcium: 9 mg/dL (ref 8.9–10.3)
GFR calc Af Amer: 58 mL/min — ABNORMAL LOW (ref >60)
GFR calc non Af Amer: 50 mL/min — ABNORMAL LOW (ref >60)
GLUCOSE: 101 mg/dL — AB (ref 65–99)
POTASSIUM: 3.9 mmol/L (ref 3.5–5.1)
SODIUM: 128 mmol/L — AB (ref 135–145)

## 2017-08-12 LAB — CBC
HEMATOCRIT: 28.4 % — AB (ref 39.0–52.0)
Hemoglobin: 9.6 g/dL — ABNORMAL LOW (ref 13.0–17.0)
MCH: 33.9 pg (ref 26.0–34.0)
MCHC: 33.8 g/dL (ref 30.0–36.0)
MCV: 100.4 fL — AB (ref 78.0–100.0)
PLATELETS: 150 10*3/uL (ref 150–400)
RBC: 2.83 MIL/uL — ABNORMAL LOW (ref 4.22–5.81)
RDW: 14.5 % (ref 11.5–15.5)
WBC: 3.3 10*3/uL — ABNORMAL LOW (ref 4.0–10.5)

## 2017-08-12 LAB — CHROMOSOME ANALYSIS, BONE MARROW

## 2017-08-12 LAB — TISSUE HYBRIDIZATION (BONE MARROW)-NCBH

## 2017-08-12 MED ORDER — LACTULOSE 10 GM/15ML PO SOLN
10.0000 g | Freq: Once | ORAL | Status: AC
  Administered 2017-08-12: 10 g via ORAL
  Filled 2017-08-12: qty 30

## 2017-08-12 MED ORDER — LACTULOSE 10 GM/15ML PO SOLN: 10.0000 g | Freq: Two times a day (BID) | ORAL | 0 refills | Status: DC | PRN

## 2017-08-12 NOTE — ED Provider Notes (Signed)
[facility] DEPT Provider Note   CSN: [id number] Arrival date & time: 08/12/17  9518     History   Chief Complaint Chief Complaint  Patient presents with  . Constipation    HPI Peter Wolfe is a 83 y.o. male.  Pt presents to the ED today with constipation.  The pt has a hx of recently diagnosed multiple myeloma and was hospitalized from 11/5-9 with pneumonia.  Pt has been taking miralax and has had issues with chronic constipation.  The pt has not been eating well and has been eating very little fiber since he was diagnosed with multiple myeloma.  The pt did f/u with Dr. Elease Hashimoto on 11/12 with sx of constipation.   Pt did not have an impaction then.   He encouraged patient to improve his diet.  The pt said he still has been unable to have a bowel movement despite taking 2 enemas today.        Past Medical History:  Diagnosis Date  . BPH (benign prostatic hyperplasia)   . Broken rib   . COPD (chronic obstructive pulmonary disease) ([place])   . GERD (gastroesophageal reflux disease)   . GERD (gastroesophageal reflux disease)   . Hyperlipidemia   . Hypertension   . Insomnia   . Panic attack     Patient Active Problem List   Diagnosis Date Noted  . HCAP (healthcare-associated pneumonia) 08/03/2017  . Anemia 08/03/2017  . Pneumonia 08/03/2017  . Multiple myeloma ([place]) 06/25/2017  . Vertebral compression fracture ([place]) 03/24/2017  . BPH (benign prostatic hyperplasia) 02/23/2017  . Anxiety 01/10/2017  . Hyponatremia 12/31/2016  . Hypokalemia 12/31/2016  . Fall 12/29/2016  . Closed fracture of posterior thoracic vertebral body, with routine healing, subsequent encounter 12/29/2016  . Back pain 12/29/2016  . CKD (chronic kidney disease), stage Wolfe ([place]) 12/29/2016  . Depression 06/03/2016  . History of fractured rib 11/28/2015  . History of peripheral edema 11/28/2015  . Seborrheic keratosis, inflamed 08/13/2015  . Actinic  keratosis 08/13/2015  . Essential hypertension, benign 07/31/2015  . URTICARIA 11/13/2007  . Hyperlipidemia 06/08/2007  . GERD 06/08/2007  . PANIC ATTACK 05/07/2007  . COPD 05/07/2007    Past Surgical History:  Procedure Laterality Date  . CHOLECYSTECTOMY         Home Medications    Prior to Admission medications   Medication Sig Start Date End Date Taking? Authorizing Provider  acyclovir (ZOVIRAX) 400 MG tablet Take 400 mg 2 (two) times daily by mouth. 07/28/17  Yes [provider]  aspirin 325 MG tablet Take 650 mg daily as needed by mouth for mild pain (("BREATHING")).    Yes [provider]  busPIRone (BUSPAR) 30 MG tablet TAKE 1 TABLET DAILY 07/29/16  Yes Dorena Cookey, MD  cefUROXime (CEFTIN) 500 MG tablet Take 500 mg 2 (two) times daily by mouth. Started 11/10 for 5 days 08/07/17  Yes [provider]  cloNIDine (CATAPRES) 0.1 MG tablet TAKE 1 TABLET DAILY 07/29/16  Yes Dorena Cookey, MD  FLUoxetine (PROZAC) 40 MG capsule TAKE ONE CAPSULE BY MOUTH ONCE DAILY 04/14/17  Yes Nafziger, Tommi Rumps, NP  furosemide (LASIX) 20 MG tablet Take 1 tablet (20 mg total) daily for 10 days by mouth. 08/07/17 08/17/17 Yes Aline August, MD  guaiFENesin (MUCINEX) 600 MG 12 hr tablet Take 1 tablet (600 mg total) 2 (two) times daily for 10 days by mouth. 08/07/17 08/17/17 Yes Aline August, MD  mirtazapine (REMERON) 45 MG tablet  TAKE ONE TABLET BY MOUTH AT BEDTIME 05/12/17  Yes Nafziger, Tommi Rumps, NP  Multiple Vitamin (MULTIVITAMIN) capsule Take 1 capsule by mouth daily.   Yes [provider]  omeprazole (PRILOSEC) 40 MG capsule Take 1 capsule (40 mg total) by mouth daily. 01/05/17  Yes Domenic Polite, MD  ondansetron (ZOFRAN) 8 MG tablet Take 8 mg every 8 (eight) hours as needed by mouth for nausea or vomiting.  07/13/17  Yes [provider]  polyethylene glycol (MIRALAX / GLYCOLAX) packet Take 17 g daily as needed by mouth for mild constipation. 08/07/17  Yes  Aline August, MD  prochlorperazine (COMPAZINE) 10 MG tablet Take 10 mg daily as needed by mouth for nausea or vomiting.  07/13/17  Yes [provider]  simvastatin (ZOCOR) 10 MG tablet TAKE 1 TABLET AT BEDTIME 07/29/16  Yes Dorena Cookey, MD  tamsulosin (FLOMAX) 0.4 MG CAPS capsule TAKE 1 CAPSULE DAILY 07/29/16  Yes Dorena Cookey, MD  traZODone (DESYREL) 50 MG tablet Take 50 mg by mouth at bedtime.   Yes [provider]  lactulose (CHRONULAC) 10 GM/15ML solution Take 15 mLs (10 g total) 2 (two) times daily as needed by mouth for mild constipation. 08/12/17   Isla Pence, MD    Family History Family History  Problem Relation Age of Onset  . Emphysema Father   . Alcohol abuse Father   . Diabetes Sister   . Alzheimer's disease Sister     Social History Social History   Tobacco Use  . Smoking status: Never Smoker  . Smokeless tobacco: Never Used  Substance Use Topics  . Alcohol use: Yes    Alcohol/week: 0.0 oz    Comment: occasional  . Drug use: No     Allergies   Influenza virus vacc split pf; Doxycycline; and Eggs or egg-derived products   Review of Systems Review of Systems  Gastrointestinal: Positive for constipation.  All other systems reviewed and are negative.    Physical Exam Updated Vital Signs BP (!) 172/91 (BP Location: Right Arm)   Pulse 75   Temp 98.4 F (36.9 C) (Oral)   Resp 16   SpO2 97%   Physical Exam  Constitutional: He is oriented to person, place, and time. He appears well-developed and well-nourished.  HENT:  Head: Normocephalic and atraumatic.  Right Ear: External ear normal.  Left Ear: External ear normal.  Nose: Nose normal.  Mouth/Throat: Oropharynx is clear and moist.  Eyes: Conjunctivae and EOM are normal. Pupils are equal, round, and reactive to light.  Neck: Normal range of motion. Neck supple.  Cardiovascular: Normal rate, regular rhythm, normal heart sounds and intact distal pulses.  Pulmonary/Chest:  Effort normal and breath sounds normal.  Abdominal: Soft. Bowel sounds are normal.  Musculoskeletal: Normal range of motion.  Neurological: He is alert and oriented to person, place, and time.  Skin: Skin is warm and dry.  Psychiatric: He has a normal mood and affect. His behavior is normal. Judgment and thought content normal.  Nursing note and vitals reviewed.    ED Treatments / Results  Labs (all labs ordered are listed, but only abnormal results are displayed) Labs Reviewed  BASIC METABOLIC PANEL - Abnormal; Notable for the following components:      Result Value   Sodium 128 (*)    Chloride 94 (*)    Glucose, Bld 101 (*)    Creatinine, Ser 1.25 (*)    GFR calc non Af Amer 50 (*)    GFR calc  Af Amer 58 (*)    All other components within normal limits  CBC - Abnormal; Notable for the following components:   WBC 3.3 (*)    RBC 2.83 (*)    Hemoglobin 9.6 (*)    HCT 28.4 (*)    MCV 100.4 (*)    All other components within normal limits    EKG  EKG Interpretation None       Radiology Dg Abdomen Acute W/chest  Result Date: 08/12/2017 CLINICAL DATA:  No bowel movement for several days without relief following enemas EXAM: DG ABDOMEN ACUTE W/ 1V CHEST COMPARISON:  08/06/2017 FINDINGS: Cardiac shadow is stable. Large hiatal hernia is noted. The lungs are well aerated bilaterally without focal infiltrate or sizable effusion. Scattered large and small bowel gas is noted. Some mild retained fecal material is noted within the right colon although no obstructive changes are seen. Diffuse vascular calcifications are noted. Degenerative changes of lumbar spine are seen as well as postsurgical changes. IMPRESSION: Mild retained fecal material within the right colon although no significant constipation is noted. No obstructive changes are seen. Large hiatal hernia. Electronically Signed   By: Inez Catalina M.D.   On: 08/12/2017 08:54    Procedures Procedures (including critical care  time)  Medications Ordered in ED Medications  lactulose (CHRONULAC) 10 GM/15ML solution 10 g (10 g Oral Given 08/12/17 0941)     Initial Impression / Assessment and Plan / ED Course  I have reviewed the triage vital signs and the nursing notes.  Pertinent labs & imaging results that were available during my care of the patient were reviewed by me and considered in my medical decision making (see chart for details).   Stool is in the RUQ, so I don't think another enema will help.  I will give pt a dose of lactulose here and will d/c home with lactulose.  The pt is told to take that instead of miralax.  He is told to continue drinking water and increasing fiber in diet.  Potassium is better now.  Pt is stable for d/c.  He knows to return if worse.  Final Clinical Impressions(s) / ED Diagnoses   Final diagnoses:  Slow transit constipation    ED Discharge Orders        Ordered    lactulose (CHRONULAC) 10 GM/15ML solution  2 times daily PRN     08/12/17 [id number]       Isla Pence, MD 08/12/17 0945

## 2017-08-12 NOTE — ED Triage Notes (Signed)
Pt complains of constipation, states it has been 10 days since he had normal bowel movement. Pt states he tried 2 enemas this morning with no relief. Pt states he has hx of chronic constipation. Pt usually has BM every 3-4 days with use of suppositories. Pt denies pain at this time. Pt was recently diagnosed with multiple myeloma, last had chemo 2 weeks ago.

## 2017-08-12 NOTE — Telephone Encounter (Signed)
Spoke with patient's niece regarding appointments that were added per 11/14 sch msg.

## 2017-08-13 ENCOUNTER — Ambulatory Visit: Payer: Medicare Other

## 2017-08-13 ENCOUNTER — Ambulatory Visit: Payer: Medicare Other | Admitting: Hematology and Oncology

## 2017-08-13 ENCOUNTER — Other Ambulatory Visit: Payer: Medicare Other

## 2017-08-17 ENCOUNTER — Other Ambulatory Visit (HOSPITAL_BASED_OUTPATIENT_CLINIC_OR_DEPARTMENT_OTHER): Payer: Medicare Other

## 2017-08-17 DIAGNOSIS — C9000 Multiple myeloma not having achieved remission: Secondary | ICD-10-CM

## 2017-08-17 LAB — CBC WITH DIFFERENTIAL/PLATELET
BASO%: 0.3 % (ref 0.0–2.0)
Basophils Absolute: 0 10*3/uL (ref 0.0–0.1)
EOS ABS: 0 10*3/uL (ref 0.0–0.5)
EOS%: 0.6 % (ref 0.0–7.0)
HEMATOCRIT: 27.7 % — AB (ref 38.4–49.9)
HEMOGLOBIN: 9.5 g/dL — AB (ref 13.0–17.1)
LYMPH%: 36.7 % (ref 14.0–49.0)
MCH: 34.6 pg — ABNORMAL HIGH (ref 27.2–33.4)
MCHC: 34.2 g/dL (ref 32.0–36.0)
MCV: 101.3 fL — AB (ref 79.3–98.0)
MONO#: 0.4 10*3/uL (ref 0.1–0.9)
MONO%: 13.4 % (ref 0.0–14.0)
NEUT%: 49 % (ref 39.0–75.0)
NEUTROS ABS: 1.5 10*3/uL (ref 1.5–6.5)
PLATELETS: 153 10*3/uL (ref 140–400)
RBC: 2.74 10*6/uL — ABNORMAL LOW (ref 4.20–5.82)
RDW: 16 % — AB (ref 11.0–14.6)
WBC: 3.1 10*3/uL — AB (ref 4.0–10.3)
lymph#: 1.1 10*3/uL (ref 0.9–3.3)

## 2017-08-17 LAB — COMPREHENSIVE METABOLIC PANEL
ALK PHOS: 42 U/L (ref 40–150)
ALT: 16 U/L (ref 0–55)
AST: 16 U/L (ref 5–34)
Albumin: 3 g/dL — ABNORMAL LOW (ref 3.5–5.0)
Anion Gap: 8 mEq/L (ref 3–11)
BUN: 9.5 mg/dL (ref 7.0–26.0)
CHLORIDE: 100 meq/L (ref 98–109)
CO2: 26 meq/L (ref 22–29)
Calcium: 9.1 mg/dL (ref 8.4–10.4)
Creatinine: 1.3 mg/dL (ref 0.7–1.3)
EGFR: 48 mL/min/{1.73_m2} — ABNORMAL LOW (ref >60)
GLUCOSE: 105 mg/dL (ref 70–140)
Potassium: 3.9 mEq/L (ref 3.5–5.1)
SODIUM: 134 meq/L — AB (ref 136–145)
Total Bilirubin: 0.51 mg/dL (ref 0.20–1.20)
Total Protein: 10 g/dL — ABNORMAL HIGH (ref 6.4–8.3)

## 2017-08-17 LAB — MAGNESIUM: Magnesium: 1.7 mg/dl (ref 1.5–2.5)

## 2017-08-18 LAB — PHOSPHORUS: PHOSPHORUS: 4 mg/dL (ref 2.5–4.5)

## 2017-08-26 ENCOUNTER — Ambulatory Visit (HOSPITAL_BASED_OUTPATIENT_CLINIC_OR_DEPARTMENT_OTHER): Payer: Medicare Other

## 2017-08-26 ENCOUNTER — Encounter: Payer: Self-pay | Admitting: Hematology and Oncology

## 2017-08-26 ENCOUNTER — Ambulatory Visit (HOSPITAL_BASED_OUTPATIENT_CLINIC_OR_DEPARTMENT_OTHER): Payer: Medicare Other | Admitting: Hematology and Oncology

## 2017-08-26 ENCOUNTER — Other Ambulatory Visit: Payer: Self-pay

## 2017-08-26 VITALS — BP 164/84 | HR 92 | Temp 98.6°F | Resp 17 | Ht 65.0 in | Wt 133.4 lb

## 2017-08-26 DIAGNOSIS — C9000 Multiple myeloma not having achieved remission: Secondary | ICD-10-CM

## 2017-08-26 DIAGNOSIS — Z5111 Encounter for antineoplastic chemotherapy: Secondary | ICD-10-CM

## 2017-08-26 DIAGNOSIS — Z5112 Encounter for antineoplastic immunotherapy: Secondary | ICD-10-CM

## 2017-08-26 MED ORDER — PROCHLORPERAZINE MALEATE 10 MG PO TABS
10.0000 mg | ORAL_TABLET | Freq: Once | ORAL | Status: AC
  Administered 2017-08-26: 10 mg via ORAL

## 2017-08-26 MED ORDER — BORTEZOMIB CHEMO SQ INJECTION 3.5 MG (2.5MG/ML)
1.3000 mg/m2 | Freq: Once | INTRAMUSCULAR | Status: AC
  Administered 2017-08-26: 2.25 mg via SUBCUTANEOUS
  Filled 2017-08-26: qty 2.25

## 2017-08-26 MED ORDER — PROCHLORPERAZINE MALEATE 10 MG PO TABS
ORAL_TABLET | ORAL | Status: AC
  Filled 2017-08-26: qty 1

## 2017-08-26 NOTE — Progress Notes (Signed)
Ok to treat with labs from the 19th per Dr. Lebron Conners.  Cyndia Bent RN

## 2017-08-26 NOTE — Patient Instructions (Signed)
[facility] Discharge Instructions for Patients Receiving Chemotherapy  Today you received the following chemotherapy agents Velcade To help prevent nausea and vomiting after your treatment, we encourage you to take your nausea medication as prescribed.   If you develop nausea and vomiting that is not controlled by your nausea medication, call the clinic.   BELOW ARE SYMPTOMS THAT SHOULD BE REPORTED IMMEDIATELY:  *FEVER GREATER THAN 100.5 F  *CHILLS WITH OR WITHOUT FEVER  NAUSEA AND VOMITING THAT IS NOT CONTROLLED WITH YOUR NAUSEA MEDICATION  *UNUSUAL SHORTNESS OF BREATH  *UNUSUAL BRUISING OR BLEEDING  TENDERNESS IN MOUTH AND THROAT WITH OR WITHOUT PRESENCE OF ULCERS  *URINARY PROBLEMS  *BOWEL PROBLEMS  UNUSUAL RASH Items with * indicate a potential emergency and should be followed up as soon as possible.  Feel free to call the clinic should you have any questions or concerns. The clinic phone number is [phone].  Please show the CHEMO ALERT CARD at check-in to the Emergency Department and triage nurse.   

## 2017-08-27 ENCOUNTER — Other Ambulatory Visit: Payer: Self-pay

## 2017-08-27 ENCOUNTER — Other Ambulatory Visit: Payer: Self-pay | Admitting: Hematology and Oncology

## 2017-08-27 ENCOUNTER — Telehealth: Payer: Self-pay | Admitting: Hematology and Oncology

## 2017-08-27 MED ORDER — LACTULOSE 10 GM/15ML PO SOLN: 10.0000 g | Freq: Two times a day (BID) | ORAL | 3 refills | Status: AC | PRN

## 2017-08-27 NOTE — Telephone Encounter (Signed)
Unable to scheduled 11/28 los - due to availability in the treatment area - message sent to [facility]

## 2017-08-27 NOTE — Telephone Encounter (Signed)
Per 11/28 sch msg - spoke with patient's niece regarding appts.

## 2017-08-28 ENCOUNTER — Telehealth: Payer: Self-pay | Admitting: Hematology and Oncology

## 2017-08-28 NOTE — Telephone Encounter (Signed)
Scheduled appt per 11/28 sch msg - spoke with niece regarding appt that was added OK per Dr. Lebron Conners.

## 2017-09-03 ENCOUNTER — Other Ambulatory Visit (HOSPITAL_BASED_OUTPATIENT_CLINIC_OR_DEPARTMENT_OTHER): Payer: Medicare Other

## 2017-09-03 ENCOUNTER — Other Ambulatory Visit: Payer: Self-pay

## 2017-09-03 ENCOUNTER — Ambulatory Visit: Payer: Medicare Other

## 2017-09-03 ENCOUNTER — Encounter: Payer: Medicare Other | Admitting: Nutrition

## 2017-09-03 ENCOUNTER — Ambulatory Visit (HOSPITAL_BASED_OUTPATIENT_CLINIC_OR_DEPARTMENT_OTHER): Payer: Medicare Other | Admitting: Hematology and Oncology

## 2017-09-03 ENCOUNTER — Telehealth: Payer: Self-pay | Admitting: Hematology and Oncology

## 2017-09-03 ENCOUNTER — Encounter: Payer: Self-pay | Admitting: Hematology and Oncology

## 2017-09-03 VITALS — BP 165/86 | HR 73 | Temp 98.6°F | Resp 17 | Ht 65.0 in | Wt 135.0 lb

## 2017-09-03 DIAGNOSIS — Z5111 Encounter for antineoplastic chemotherapy: Secondary | ICD-10-CM

## 2017-09-03 DIAGNOSIS — C9000 Multiple myeloma not having achieved remission: Secondary | ICD-10-CM | POA: Diagnosis not present

## 2017-09-03 DIAGNOSIS — D649 Anemia, unspecified: Secondary | ICD-10-CM

## 2017-09-03 LAB — CBC WITH DIFFERENTIAL/PLATELET
BASO%: 0 % (ref 0.0–2.0)
BASOS ABS: 0 10*3/uL (ref 0.0–0.1)
EOS ABS: 0 10*3/uL (ref 0.0–0.5)
EOS%: 0.9 % (ref 0.0–7.0)
HEMATOCRIT: 29.2 % — AB (ref 38.4–49.9)
HEMOGLOBIN: 9.8 g/dL — AB (ref 13.0–17.1)
LYMPH#: 1.2 10*3/uL (ref 0.9–3.3)
LYMPH%: 51.5 % — ABNORMAL HIGH (ref 14.0–49.0)
MCH: 34.5 pg — AB (ref 27.2–33.4)
MCHC: 33.6 g/dL (ref 32.0–36.0)
MCV: 102.8 fL — AB (ref 79.3–98.0)
MONO#: 0.3 10*3/uL (ref 0.1–0.9)
MONO%: 13 % (ref 0.0–14.0)
NEUT%: 34.6 % — ABNORMAL LOW (ref 39.0–75.0)
NEUTROS ABS: 0.8 10*3/uL — AB (ref 1.5–6.5)
Platelets: 70 10*3/uL — ABNORMAL LOW (ref 140–400)
RBC: 2.84 10*6/uL — ABNORMAL LOW (ref 4.20–5.82)
RDW: 15.6 % — AB (ref 11.0–14.6)
WBC: 2.3 10*3/uL — AB (ref 4.0–10.3)

## 2017-09-03 LAB — COMPREHENSIVE METABOLIC PANEL
ALT: 9 U/L (ref 0–55)
ANION GAP: 10 meq/L (ref 3–11)
AST: 14 U/L (ref 5–34)
Albumin: 3 g/dL — ABNORMAL LOW (ref 3.5–5.0)
Alkaline Phosphatase: 39 U/L — ABNORMAL LOW (ref 40–150)
BUN: 11.6 mg/dL (ref 7.0–26.0)
CHLORIDE: 103 meq/L (ref 98–109)
CO2: 22 meq/L (ref 22–29)
Calcium: 8.7 mg/dL (ref 8.4–10.4)
Creatinine: 1.3 mg/dL (ref 0.7–1.3)
EGFR: 50 mL/min/{1.73_m2} — AB (ref >60)
Glucose: 92 mg/dl (ref 70–140)
POTASSIUM: 4 meq/L (ref 3.5–5.1)
Sodium: 135 mEq/L — ABNORMAL LOW (ref 136–145)
Total Bilirubin: 0.53 mg/dL (ref 0.20–1.20)
Total Protein: 9.6 g/dL — ABNORMAL HIGH (ref 6.4–8.3)

## 2017-09-03 MED ORDER — FUROSEMIDE 20 MG PO TABS: 20.0000 mg | ORAL_TABLET | Freq: Every day | ORAL | 0 refills | Status: AC

## 2017-09-03 NOTE — Telephone Encounter (Signed)
Gave avs and calendar for December  °

## 2017-09-08 ENCOUNTER — Telehealth: Payer: Self-pay

## 2017-09-08 NOTE — Telephone Encounter (Signed)
Daughter called to let staff know that pt is being moved to [place] after his treatment tomorrow. She is wanting to get a disk of radiology and any pertinent chart paperwork. Dr Lebron Conners may have already gotten this on a disk. She will be here 12/12 to pick up.

## 2017-09-08 NOTE — Telephone Encounter (Signed)
Maylon Cos in the radiology reading room at [phone] to request a disk of radiology test for patient pick-up tomorrow 09/09/17. Tedra Coupe stated she will call me when disk is ready for pick-up. Pertinent chart paperwork printed and placed in an envelope for patient pick-up.

## 2017-09-09 ENCOUNTER — Encounter: Payer: Self-pay | Admitting: *Deleted

## 2017-09-09 ENCOUNTER — Telehealth: Payer: Self-pay | Admitting: *Deleted

## 2017-09-09 ENCOUNTER — Other Ambulatory Visit: Payer: Self-pay

## 2017-09-09 ENCOUNTER — Ambulatory Visit (HOSPITAL_BASED_OUTPATIENT_CLINIC_OR_DEPARTMENT_OTHER): Payer: Medicare Other

## 2017-09-09 ENCOUNTER — Other Ambulatory Visit (HOSPITAL_BASED_OUTPATIENT_CLINIC_OR_DEPARTMENT_OTHER): Payer: Medicare Other

## 2017-09-09 VITALS — BP 165/86 | HR 89 | Temp 98.0°F | Resp 18

## 2017-09-09 DIAGNOSIS — Z5112 Encounter for antineoplastic immunotherapy: Secondary | ICD-10-CM | POA: Diagnosis present

## 2017-09-09 DIAGNOSIS — C9000 Multiple myeloma not having achieved remission: Secondary | ICD-10-CM

## 2017-09-09 LAB — CBC WITH DIFFERENTIAL/PLATELET
BASO%: 0 % (ref 0.0–2.0)
BASOS ABS: 0 10*3/uL (ref 0.0–0.1)
EOS ABS: 0 10*3/uL (ref 0.0–0.5)
EOS%: 0 % (ref 0.0–7.0)
HCT: 30.4 % — ABNORMAL LOW (ref 38.4–49.9)
HGB: 10.1 g/dL — ABNORMAL LOW (ref 13.0–17.1)
LYMPH%: 12.5 % — AB (ref 14.0–49.0)
MCH: 33.9 pg — AB (ref 27.2–33.4)
MCHC: 33.2 g/dL (ref 32.0–36.0)
MCV: 102 fL — AB (ref 79.3–98.0)
MONO#: 0.5 10*3/uL (ref 0.1–0.9)
MONO%: 11 % (ref 0.0–14.0)
NEUT%: 76.5 % — AB (ref 39.0–75.0)
NEUTROS ABS: 3.6 10*3/uL (ref 1.5–6.5)
PLATELETS: 92 10*3/uL — AB (ref 140–400)
RBC: 2.98 10*6/uL — AB (ref 4.20–5.82)
RDW: 16.1 % — ABNORMAL HIGH (ref 11.0–14.6)
WBC: 4.7 10*3/uL (ref 4.0–10.3)
lymph#: 0.6 10*3/uL — ABNORMAL LOW (ref 0.9–3.3)

## 2017-09-09 LAB — COMPREHENSIVE METABOLIC PANEL
ALBUMIN: 3.1 g/dL — AB (ref 3.5–5.0)
ALT: 10 U/L (ref 0–55)
AST: 13 U/L (ref 5–34)
Alkaline Phosphatase: 40 U/L (ref 40–150)
Anion Gap: 11 mEq/L (ref 3–11)
BUN: 16.7 mg/dL (ref 7.0–26.0)
CHLORIDE: 100 meq/L (ref 98–109)
CO2: 22 meq/L (ref 22–29)
CREATININE: 1.2 mg/dL (ref 0.7–1.3)
Calcium: 9 mg/dL (ref 8.4–10.4)
EGFR: 56 mL/min/{1.73_m2} — ABNORMAL LOW (ref >60)
GLUCOSE: 138 mg/dL (ref 70–140)
POTASSIUM: 4.1 meq/L (ref 3.5–5.1)
SODIUM: 133 meq/L — AB (ref 136–145)
Total Bilirubin: 0.67 mg/dL (ref 0.20–1.20)
Total Protein: 10.1 g/dL — ABNORMAL HIGH (ref 6.4–8.3)

## 2017-09-09 LAB — MAGNESIUM: MAGNESIUM: 1.9 mg/dL (ref 1.5–2.5)

## 2017-09-09 MED ORDER — DEXAMETHASONE 4 MG PO TABS: ORAL_TABLET | ORAL | Status: AC

## 2017-09-09 MED ORDER — PROCHLORPERAZINE MALEATE 10 MG PO TABS
ORAL_TABLET | ORAL | Status: AC
  Filled 2017-09-09: qty 1

## 2017-09-09 MED ORDER — BORTEZOMIB CHEMO SQ INJECTION 3.5 MG (2.5MG/ML)
1.3000 mg/m2 | Freq: Once | INTRAMUSCULAR | Status: AC
  Administered 2017-09-09: 2.25 mg via SUBCUTANEOUS
  Filled 2017-09-09: qty 2.25

## 2017-09-09 MED ORDER — PROCHLORPERAZINE MALEATE 10 MG PO TABS
10.0000 mg | ORAL_TABLET | Freq: Once | ORAL | Status: AC
  Administered 2017-09-09: 10 mg via ORAL

## 2017-09-09 NOTE — Progress Notes (Signed)
Patient reporting that he is taking Decadron PO this am per Dr Lebron Conners direction.  I instructed patient to call us back this afternoon so we can add it to his med list.

## 2017-09-09 NOTE — Telephone Encounter (Signed)
"  I'm calling to report my uncles medication.  Nurse concerned it is not on his medication list.  Today he started using the dexamethasone 4 mg tablets (10 mg) total every Tuesday and Friday.  No date on the bottle but believes it was ordered in November.  He's actually moving to [place] with me tomorrow.  Is he to continue this?  What is this for? Dexamethasone is part of the treatment regimen used with Velcade.  Helps Velcade, use, nausea and swelling.   No further questions or needs at this time.Marland Kitchen

## 2017-09-09 NOTE — Patient Instructions (Signed)
[facility] Discharge Instructions for Patients Receiving Chemotherapy  Today you received the following chemotherapy agents Velcade.  To help prevent nausea and vomiting after your treatment, we encourage you to take your nausea medication as directed.  If you develop nausea and vomiting that is not controlled by your nausea medication, call the clinic.   BELOW ARE SYMPTOMS THAT SHOULD BE REPORTED IMMEDIATELY:  *FEVER GREATER THAN 100.5 F  *CHILLS WITH OR WITHOUT FEVER  NAUSEA AND VOMITING THAT IS NOT CONTROLLED WITH YOUR NAUSEA MEDICATION  *UNUSUAL SHORTNESS OF BREATH  *UNUSUAL BRUISING OR BLEEDING  TENDERNESS IN MOUTH AND THROAT WITH OR WITHOUT PRESENCE OF ULCERS  *URINARY PROBLEMS  *BOWEL PROBLEMS  UNUSUAL RASH Items with * indicate a potential emergency and should be followed up as soon as possible.  Feel free to call the clinic should you have any questions or concerns. The clinic phone number is [phone].  Please show the CHEMO ALERT CARD at check-in to the Emergency Department and triage nurse.   

## 2017-09-09 NOTE — Progress Notes (Signed)
Per Dr. Irene Limbo (covering for Dr. Lebron Conners), okay to treat pt with plt of 92.

## 2017-09-09 NOTE — Progress Notes (Signed)
Ok to treat with platelet 92 per Dr. Irene Limbo

## 2017-09-11 ENCOUNTER — Other Ambulatory Visit: Payer: Self-pay | Admitting: Family Medicine

## 2017-09-11 DIAGNOSIS — G479 Sleep disorder, unspecified: Secondary | ICD-10-CM

## 2017-09-11 DIAGNOSIS — R634 Abnormal weight loss: Secondary | ICD-10-CM

## 2017-09-11 DIAGNOSIS — F331 Major depressive disorder, recurrent, moderate: Secondary | ICD-10-CM

## 2017-09-11 MED ORDER — MIRTAZAPINE 45 MG PO TABS: 45.0000 mg | ORAL_TABLET | Freq: Every day | ORAL | 1 refills | Status: AC

## 2017-09-11 NOTE — Telephone Encounter (Signed)
Ok to refill 

## 2017-09-11 NOTE — Telephone Encounter (Signed)
Sent to the pharmacy by e-scribe as instructed. 

## 2017-09-16 ENCOUNTER — Telehealth: Payer: Self-pay

## 2017-09-16 NOTE — Telephone Encounter (Signed)
Spoke with representative from [place] regarding request to refill prescriptions for Trazadone, Omeprazole, Buspirone, Clonidine, Simvastatin, and Tamsulosin for patient. Informed representative that we will not be able to refill those prescriptions because they are not directly related to patient oncology diagnosis, per Dr. Irene Limbo. Informed that they can reach out to patient's PCP. Provided PCP name and office number. Representative agreeable and voiced understanding.

## 2017-09-28 NOTE — Progress Notes (Signed)
[facility] Follow-up Visit:  Assessment: Multiple myeloma not having achieved remission [facility]) 83 y.o. male with initial presentation of dysproteinemia with 3.9 g/dL of monoclonal protein in peripheral blood. On presentation, patient did have mild anemia, somewhat decreased renal function, no hypercalcemia, and early neuropathy.we conducted additional assessment demonstrating rising levels of clinical protein, protein was identified as IgG lambda. Patient has depressed levels of IgA and IgM with high levels of IgG exceeding 6 g/dL. Kappa to lambda ratio is 0.03, approaching diagnostic threshold for multiple myeloma by itself. Additional evaluation with PET/CT and bone marrow biopsy confirms presence of multiple myeloma.  First cycle of chemotherapy was complicated by admission pneumonia and cardiac arrhythmia. Patient has fully recovered from that, but treatment was interrupted after patient has received but the single dose of therapy. Initially, patient developed significant symptomatic constipation that now has improved significantly after initiation of lactulose therapy.  Plan: --Proceed with c2d1 bortezomib/dexamethasone --patient plans on relocating to [facility] over the next several weeks. Will administer as many doses here as possible, patient will resume therapy locally once he relocates to [place]..   Voice recognition software was used and creation of this note. Despite my best effort at editing the text, some misspelling/errors may have occurred.   No orders of the defined types were placed in this encounter.   Cancer Staging No matching staging information was found for the patient.  All questions were answered.  . The patient knows to call the clinic with any problems, questions or concerns.  This note was electronically signed.    History of Presenting Illness Peter Wolfe is an 83 y.o. male followed in the [facility] for multiple myeloma.  Patient was found with total protein number on routine lab work and was referred for our evaluation after for pheresis demonstrated presence of monoclonal spike. Overall, patient reports feeling well. He denies any fevers, chills, night sweats. As any skeletal pain, but acknowledges some weight loss losing about 15 pounds from his baseline over the past year. He does have good appetite and reports eating abundant ice cream, without nausea, vomiting, abdominal pain, early satiety, diarrhea, but with some history of constipation. He denies any chest pain, shortness of breath or cough. Denies any urinary symptoms. He does acknowledge that he has been having difficulties with numbness and tingling in bilateral feet which is new for him. He has a history of years of [employer] service including working in [place] with possible [employer] exposure.  Since last visit to the clinic, patient has presented the emergency room with constipation. With adjustments to the treatment regimen, now moving bowels regularly every three days without significant pain. Complaining of right posterior rib pain doll and character. No other new complaints. Denies any recurrence of the fever or shortness of breath.  Oncological/hematological History:    Multiple myeloma not having achieved remission ([facility])   03/24/2017 Tumor Marker    tProt 10.0, Alb 3.7, Ca 9.9, Cr 1.3, AP 42; SPEP -- M-Spike 3.9g/dL; WBC 4.4, Hgb 10.2, Plt 158      06/18/2017 Tumor Marker    tProt 11.5, Alb 3.7, Ca 9.5, Cr 1.5, AP 45; LDH 127;  SPEP -- M-spike 4.1g/dL, IgG lambda by SIFE; IgG 6524, IgA 6, IgM 7; kappa 4.4, lambda 171, KLR 0.03; UPEP -- 65m/24hrs  WBC 3.7, Hgb 11.2, Plt 128; Viscosity 2.3;       06/25/2017 Initial Diagnosis    [place]: --Cytogen, 07/21/17: normal cytogenetic analysis, 46,  XY --FISH: abnormal, 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 BPH (benign prostatic hyperplasia)   . Broken rib   . COPD (chronic obstructive pulmonary disease) ([facility]  . GERD (gastroesophageal reflux disease)   . GERD (gastroesophageal reflux disease)   . Hyperlipidemia   . Hypertension   . Insomnia   . Panic attack     Surgical History: Past Surgical History:  Procedure Laterality Date  . CHOLECYSTECTOMY      Family History: Family History  Problem Relation Age of Onset  . Emphysema Father   . Alcohol abuse Father   . Diabetes Sister   . Alzheimer's disease Sister     Social History: Social History   Socioeconomic History  . Marital status: Single    Spouse name: Not on file  . Number of children: Not on file  . Years of education: Not on file  . Highest education level: Not on file  Social Needs  . Financial resource strain: Not on file  . Food insecurity - worry: Not on file  . Food insecurity - inability: Not on file  . Transportation needs - medical: Not on file  . Transportation needs - non-medical: Not on file  Occupational History  . Not on file  Tobacco Use  . Smoking status: Never Smoker  . Smokeless tobacco: Never Used  Substance and Sexual Activity  . Alcohol use: Yes    Alcohol/week: 0.0 oz    Comment: occasional  . Drug use: No  . Sexual activity: Not on file  Other Topics Concern  . Not on file  Social History Narrative   Retired - Sgt, [employer]    Not married           Allergies: Allergies  Allergen Reactions  . Influenza Virus Vacc Split Pf Other (See Comments)    Allergic to eggs  .  Doxycycline Rash  . Eggs Or Egg-Derived Products Rash    Medications:  Current Outpatient Medications  Medication Sig Dispense Refill  . acyclovir (ZOVIRAX) 400 MG tablet Take 400 mg 2 (two) times daily by mouth.    . Marland Kitchenspirin 325 MG tablet Take 650 mg daily as needed by mouth for mild pain (("BREATHING")).     . busPIRone (BUSPAR) 30 MG tablet TAKE 1 TABLET DAILY 100 tablet 3  . cefUROXime (CEFTIN) 500 MG tablet Take 500 mg 2 (two) times daily by mouth. Started 11/10 for 5 days    . cloNIDine (CATAPRES) 0.1 MG tablet TAKE 1 TABLET DAILY 100 tablet 3  . dexamethasone (DECADRON) 4 MG tablet Take 2.5 tablets every Tuesday and Friday. 30 tablet   . FLUoxetine (PROZAC) 40 MG capsule TAKE ONE CAPSULE BY MOUTH ONCE DAILY 90 capsule 1  . furosemide (LASIX) 20 MG tablet Take 1 tablet (20 mg total) by mouth daily for 10 days. 10 tablet 0  . lactulose (CHRONULAC) 10 GM/15ML solution Take 15 mLs (10 g total) by mouth 2 (two) times daily as  needed for mild constipation. 240 mL 3  . mirtazapine (REMERON) 45 MG tablet Take 1 tablet (45 mg total) by mouth at bedtime. 90 tablet 1  . Multiple Vitamin (MULTIVITAMIN) capsule Take 1 capsule by mouth daily.    Marland Kitchen omeprazole (PRILOSEC) 40 MG capsule Take 1 capsule (40 mg total) by mouth daily.    . ondansetron (ZOFRAN) 8 MG tablet Take 8 mg every 8 (eight) hours as needed by mouth for nausea or vomiting.     . polyethylene glycol (MIRALAX / GLYCOLAX) packet Take 17 g daily as needed by mouth for mild constipation. 14 each 0  . prochlorperazine (COMPAZINE) 10 MG tablet Take 10 mg daily as needed by mouth for nausea or vomiting.     . simvastatin (ZOCOR) 10 MG tablet TAKE 1 TABLET AT BEDTIME 100 tablet 3  . tamsulosin (FLOMAX) 0.4 MG CAPS capsule TAKE 1 CAPSULE DAILY 100 capsule 3  . traZODone (DESYREL) 50 MG tablet Take 50 mg by mouth at bedtime.     No current facility-administered medications for this visit.     Review of Systems: Review of Systems   Constitutional: Positive for unexpected weight change. Negative for appetite change, chills, diaphoresis, fatigue and fever.  HENT:  Negative.   Eyes: Negative.   Respiratory: Positive for shortness of breath.   Cardiovascular: Negative.   Gastrointestinal: Positive for constipation.  Endocrine: Negative.   Genitourinary: Negative.    Musculoskeletal: Negative.  Negative for gait problem.  Skin: Positive for itching.  Neurological: Positive for numbness. Negative for dizziness, extremity weakness, gait problem, headaches, light-headedness, seizures and speech difficulty.  Hematological: Negative.   Psychiatric/Behavioral: Negative.      PHYSICAL EXAMINATION Blood pressure (!) 164/84, pulse 92, temperature 98.6 F (37 C), temperature source Oral, resp. rate 17, height 5' 5" (1.651 m), weight 133 lb 6.4 oz (60.5 kg), SpO2 96 %.  ECOG PERFORMANCE STATUS: 2 - Symptomatic, <50% confined to bed  Physical Exam  Constitutional: He is oriented to person, place, and time and well-developed, well-nourished, and in no distress.  HENT:  Head: Normocephalic and atraumatic.  Mouth/Throat: Oropharynx is clear and moist. No oropharyngeal exudate.  Eyes: Pupils are equal, round, and reactive to light. No scleral icterus.  Neck: Normal range of motion. No thyromegaly present.  Cardiovascular: Normal rate, regular rhythm and normal heart sounds.  No murmur heard. Pulmonary/Chest: Effort normal and breath sounds normal. No respiratory distress. He has no wheezes. He has no rales.  Abdominal: Soft. Bowel sounds are normal. He exhibits no distension and no mass. There is no tenderness. There is no rebound.  Musculoskeletal: Normal range of motion. He exhibits no edema.  Lymphadenopathy:    He has no cervical adenopathy.  Neurological: He is alert and oriented to person, place, and time. He has normal reflexes. No cranial nerve deficit.  Skin: Skin is warm and dry. No rash noted. He is not  diaphoretic. No erythema.     LABORATORY DATA: I have personally reviewed the data as listed: No visits with results within 1 Week(s) from this visit.  Latest known visit with results is:  Appointment on 08/17/2017  Component Date Value Ref Range Status  . WBC 08/17/2017 3.1* 4.0 - 10.3 10e3/uL Final  . NEUT# 08/17/2017 1.5  1.5 - 6.5 10e3/uL Final  . HGB 08/17/2017 9.5* 13.0 - 17.1 g/dL Final  . HCT 08/17/2017 27.7* 38.4 - 49.9 % Final  . Platelets 08/17/2017 153  140 - 400 10e3/uL Final  . MCV  08/17/2017 101.3* 79.3 - 98.0 fL Final  . MCH 08/17/2017 34.6* 27.2 - 33.4 pg Final  . MCHC 08/17/2017 34.2  32.0 - 36.0 g/dL Final  . RBC 08/17/2017 2.74* 4.20 - 5.82 10e6/uL Final  . RDW 08/17/2017 16.0* 11.0 - 14.6 % Final  . lymph# 08/17/2017 1.1  0.9 - 3.3 10e3/uL Final  . MONO# 08/17/2017 0.4  0.1 - 0.9 10e3/uL Final  . Eosinophils Absolute 08/17/2017 0.0  0.0 - 0.5 10e3/uL Final  . Basophils Absolute 08/17/2017 0.0  0.0 - 0.1 10e3/uL Final  . NEUT% 08/17/2017 49.0  39.0 - 75.0 % Final  . LYMPH% 08/17/2017 36.7  14.0 - 49.0 % Final  . MONO% 08/17/2017 13.4  0.0 - 14.0 % Final  . EOS% 08/17/2017 0.6  0.0 - 7.0 % Final  . BASO% 08/17/2017 0.3  0.0 - 2.0 % Final  . Sodium 08/17/2017 134* 136 - 145 mEq/L Final  . Potassium 08/17/2017 3.9  3.5 - 5.1 mEq/L Final  . Chloride 08/17/2017 100  98 - 109 mEq/L Final  . CO2 08/17/2017 26  22 - 29 mEq/L Final  . Glucose 08/17/2017 105  70 - 140 mg/dl Final   Glucose reference range is for nonfasting patients. Fasting glucose reference range is 70- 100.  Marland Kitchen BUN 08/17/2017 9.5  7.0 - 26.0 mg/dL Final  . Creatinine 08/17/2017 1.3  0.7 - 1.3 mg/dL Final  . Total Bilirubin 08/17/2017 0.51  0.20 - 1.20 mg/dL Final  . Alkaline Phosphatase 08/17/2017 42  40 - 150 U/L Final  . AST 08/17/2017 16  5 - 34 U/L Final  . ALT 08/17/2017 16  0 - 55 U/L Final  . Total Protein 08/17/2017 10.0* 6.4 - 8.3 g/dL Final  . Albumin 08/17/2017 3.0* 3.5 - 5.0 g/dL Final   . Calcium 08/17/2017 9.1  8.4 - 10.4 mg/dL Final  . Anion Gap 08/17/2017 8  3 - 11 mEq/L Final  . EGFR 08/17/2017 48* >60 ml/min/1.73 m2 Final   eGFR is calculated using the CKD-EPI Creatinine Equation (2009)  . Magnesium 08/17/2017 1.7  1.5 - 2.5 mg/dl Final  . Phosphorus, Ser 08/17/2017 4.0  2.5 - 4.5 mg/dL Final       Ardath Sax, MD

## 2017-09-28 NOTE — Assessment & Plan Note (Signed)
83 y.o. male with initial presentation of dysproteinemia with 3.9 g/dL of monoclonal protein in peripheral blood. On presentation, patient did have mild anemia, somewhat decreased renal function, no hypercalcemia, and early neuropathy.we conducted additional assessment demonstrating rising levels of clinical protein, protein was identified as IgG lambda. Patient has depressed levels of IgA and IgM with high levels of IgG exceeding 6 g/dL. Kappa to lambda ratio is 0.03, approaching diagnostic threshold for multiple myeloma by itself. Additional evaluation with PET/CT and bone marrow biopsy confirms presence of multiple myeloma.  First cycle of chemotherapy was complicated by admission pneumonia and cardiac arrhythmia. Patient has fully recovered from that, but treatment was interrupted after patient has received but the single dose of therapy. Initially, patient developed significant symptomatic constipation that now has improved significantly after initiation of lactulose therapy.  Plan: --Proceed with c2d1 bortezomib/dexamethasone --patient plans on relocating to [facility] [place] over the next several weeks. Will administer as many doses here as possible, patient will resume therapy locally once he relocates to [place].Marland Kitchen

## 2017-10-03 NOTE — Progress Notes (Signed)
[facility] Follow-up Visit:  Assessment: Multiple myeloma not having achieved remission [facility]) 84 y.o. male with initial presentation of dysproteinemia with 3.9 g/dL of monoclonal protein in peripheral blood. On presentation, patient did have mild anemia, somewhat decreased renal function, no hypercalcemia, and early neuropathy.we conducted additional assessment demonstrating rising levels of clinical protein, protein was identified as IgG lambda. Patient has depressed levels of IgA and IgM with high levels of IgG exceeding 6 g/dL. Kappa to lambda ratio is 0.03, approaching diagnostic threshold for multiple myeloma by itself. Additional evaluation with PET/CT and bone marrow biopsy confirms presence of multiple myeloma.  First cycle of chemotherapy was complicated by admission pneumonia and cardiac arrhythmia. Patient has fully recovered from that, but treatment was interrupted after patient has received but the single dose of therapy. Initially, patient developed significant symptomatic constipation that now has improved significantly after initiation of lactulose therapy.  Plan: --Proceed with c2d8 bortezomib/dexamethasone --patient plans on relocating to [facility] soon. Will administer as many doses here as possible, patient will resume therapy locally once he relocates to [place]..   Voice recognition software was used and creation of this note. Despite my best effort at editing the text, some misspelling/errors may have occurred.   No orders of the defined types were placed in this encounter.   Cancer Staging No matching staging information was found for the patient.  All questions were answered.  . The patient knows to call the clinic with any problems, questions or concerns.  This note was electronically signed.    History of Presenting Illness Peter Wolfe is an 84 y.o. male followed in the [place] for multiple myeloma. Patient was found with  total protein number on routine lab work and was referred for our evaluation after for pheresis demonstrated presence of monoclonal spike. Overall, patient reports feeling well. He denies any fevers, chills, night sweats. As any skeletal pain, but acknowledges some weight loss losing about 15 pounds from his baseline over the past year. He does have good appetite and reports eating abundant ice cream, without nausea, vomiting, abdominal pain, early satiety, diarrhea, but with some history of constipation. He denies any chest pain, shortness of breath or cough. Denies any urinary symptoms. He does acknowledge that he has been having difficulties with numbness and tingling in bilateral feet which is new for him. He has a history of years of [employer] service including working in [place] with possible [employer] exposure.  Since last visit to the clinic, patient has presented the emergency room with constipation. With adjustments to the treatment regimen, now moving bowels regularly every three days without significant pain. Complaining of right posterior rib pain doll and character. No other new complaints. Denies any recurrence of the fever or shortness of breath.  Oncological/hematological History:    Multiple myeloma not having achieved remission ([place])   03/24/2017 Tumor Marker    tProt 10.0, Alb 3.7, Ca 9.9, Cr 1.3, AP 42; SPEP -- M-Spike 3.9g/dL; WBC 4.4, Hgb 10.2, Plt 158      06/18/2017 Tumor Marker    tProt 11.5, Alb 3.7, Ca 9.5, Cr 1.5, AP 45; LDH 127;  SPEP -- M-spike 4.1g/dL, IgG lambda by SIFE; IgG 6524, IgA 6, IgM 7; kappa 4.4, lambda 171, KLR 0.03; UPEP -- 48m/24hrs  WBC 3.7, Hgb 11.2, Plt 128; Viscosity 2.3;       06/25/2017 Initial Diagnosis    [place] --Cytogen, 07/21/17: normal cytogenetic analysis, 46, XY --FISH: abnormal, significant  for 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 BPH (benign prostatic hyperplasia)   . Broken rib   . [place] disease[place]  . GERD (gastroesophageal reflux disease)   . GERD (gastroesophageal reflux disease)   . Hyperlipidemia   . Hypertension   . Insomnia   . Panic attack     Surgical History: Past Surgical History:  Procedure Laterality Date  . CHOLECYSTECTOMY      Family History: Family History  Problem Relation Age of Onset  . Emphysema Father   . Alcohol abuse Father   . Diabetes Sister   . Alzheimer's disease Sister     Social History: Social History   Socioeconomic History  . Marital status: Single    Spouse name: Not on file  . Number of children: Not on file  . Years of education: Not on file  . Highest education level: Not on file  Social Needs  . Financial resource strain: Not on file  . Food insecurity - worry: Not on file  . Food insecurity - inability: Not on file  . Transportation needs - medical: Not on file  . Transportation needs - non-medical: Not on file  Occupational History  . Not on file  Tobacco Use  . Smoking status: Never Smoker  . Smokeless tobacco: Never Used  Substance and Sexual Activity  . Alcohol use: Yes    Alcohol/week: 0.0 oz    Comment: occasional  . Drug use: No  . Sexual activity: Not on file  Other Topics Concern  . Not on file  Social History Narrative   Retired - Sgt, [employer]    Not married           Allergies: Allergies  Allergen Reactions  . Influenza Virus Vacc Split Pf Other (See Comments)    Allergic to eggs  . Doxycycline Rash  . Eggs  Or Egg-Derived Products Rash    Medications:  Current Outpatient Medications  Medication Sig Dispense Refill  . acyclovir (ZOVIRAX) 400 MG tablet Take 400 mg 2 (two) times daily by mouth.    . Marland Kitchenspirin 325 MG tablet Take 650 mg daily as needed by mouth for mild pain (("BREATHING")).     . busPIRone (BUSPAR) 30 MG tablet TAKE 1 TABLET DAILY 100 tablet 3  . cefUROXime (CEFTIN) 500 MG tablet Take 500 mg 2 (two) times daily by mouth. Started 11/10 for 5 days    . cloNIDine (CATAPRES) 0.1 MG tablet TAKE 1 TABLET DAILY 100 tablet 3  . FLUoxetine (PROZAC) 40 MG capsule TAKE ONE CAPSULE BY MOUTH ONCE DAILY 90 capsule 1  . lactulose (CHRONULAC) 10 GM/15ML solution Take 15 mLs (10 g total) by mouth 2 (two) times daily as needed for mild constipation. 240 mL 3  . Multiple Vitamin (MULTIVITAMIN) capsule Take 1 capsule by mouth daily.    . Marland Kitchenmeprazole (PRILOSEC) 40 MG capsule Take 1 capsule (40 mg total) by mouth daily.    . ondansetron (ZOFRAN)  8 MG tablet Take 8 mg every 8 (eight) hours as needed by mouth for nausea or vomiting.     . polyethylene glycol (MIRALAX / GLYCOLAX) packet Take 17 g daily as needed by mouth for mild constipation. 14 each 0  . prochlorperazine (COMPAZINE) 10 MG tablet Take 10 mg daily as needed by mouth for nausea or vomiting.     . simvastatin (ZOCOR) 10 MG tablet TAKE 1 TABLET AT BEDTIME 100 tablet 3  . tamsulosin (FLOMAX) 0.4 MG CAPS capsule TAKE 1 CAPSULE DAILY 100 capsule 3  . traZODone (DESYREL) 50 MG tablet Take 50 mg by mouth at bedtime.    Marland Kitchen dexamethasone (DECADRON) 4 MG tablet Take 2.5 tablets every Tuesday and Friday. 30 tablet   . furosemide (LASIX) 20 MG tablet Take 1 tablet (20 mg total) by mouth daily for 10 days. 10 tablet 0  . mirtazapine (REMERON) 45 MG tablet Take 1 tablet (45 mg total) by mouth at bedtime. 90 tablet 1   No current facility-administered medications for this visit.     Review of Systems: Review of Systems  Constitutional: Positive for  unexpected weight change. Negative for appetite change, chills, diaphoresis, fatigue and fever.  HENT:  Negative.   Eyes: Negative.   Respiratory: Positive for shortness of breath.   Cardiovascular: Negative.   Gastrointestinal: Positive for constipation.  Endocrine: Negative.   Genitourinary: Negative.    Musculoskeletal: Negative.  Negative for gait problem.  Skin: Positive for itching.  Neurological: Positive for numbness. Negative for dizziness, extremity weakness, gait problem, headaches, light-headedness, seizures and speech difficulty.  Hematological: Negative.   Psychiatric/Behavioral: Negative.      PHYSICAL EXAMINATION Blood pressure (!) 165/86, pulse 73, temperature 98.6 F (37 C), temperature source Oral, resp. rate 17, height 5' 5"  (1.651 m), weight 135 lb (61.2 kg), SpO2 97 %.  ECOG PERFORMANCE STATUS: 2 - Symptomatic, <50% confined to bed  Physical Exam  Constitutional: He is oriented to person, place, and time and well-developed, well-nourished, and in no distress.  HENT:  Head: Normocephalic and atraumatic.  Mouth/Throat: Oropharynx is clear and moist. No oropharyngeal exudate.  Eyes: Pupils are equal, round, and reactive to light. No scleral icterus.  Neck: Normal range of motion. No thyromegaly present.  Cardiovascular: Normal rate, regular rhythm and normal heart sounds.  No murmur heard. Pulmonary/Chest: Effort normal and breath sounds normal. No respiratory distress. He has no wheezes. He has no rales.  Abdominal: Soft. Bowel sounds are normal. He exhibits no distension and no mass. There is no tenderness. There is no rebound.  Musculoskeletal: Normal range of motion. He exhibits no edema.  Lymphadenopathy:    He has no cervical adenopathy.  Neurological: He is alert and oriented to person, place, and time. He has normal reflexes. No cranial nerve deficit.  Skin: Skin is warm and dry. No rash noted. He is not diaphoretic. No erythema.     LABORATORY  DATA: I have personally reviewed the data as listed: Appointment on 09/03/2017  Component Date Value Ref Range Status  . Sodium 09/03/2017 135* 136 - 145 mEq/L Final  . Potassium 09/03/2017 4.0  3.5 - 5.1 mEq/L Final  . Chloride 09/03/2017 103  98 - 109 mEq/L Final  . CO2 09/03/2017 22  22 - 29 mEq/L Final  . Glucose 09/03/2017 92  70 - 140 mg/dl Final   Glucose reference range is for nonfasting patients. Fasting glucose reference range is 70- 100.  Marland Kitchen BUN 09/03/2017 11.6  7.0 - 26.0  mg/dL Final  . Creatinine 09/03/2017 1.3  0.7 - 1.3 mg/dL Final  . Total Bilirubin 09/03/2017 0.53  0.20 - 1.20 mg/dL Final  . Alkaline Phosphatase 09/03/2017 39* 40 - 150 U/L Final  . AST 09/03/2017 14  5 - 34 U/L Final  . ALT 09/03/2017 9  0 - 55 U/L Final  . Total Protein 09/03/2017 9.6* 6.4 - 8.3 g/dL Final  . Albumin 09/03/2017 3.0* 3.5 - 5.0 g/dL Final  . Calcium 09/03/2017 8.7  8.4 - 10.4 mg/dL Final  . Anion Gap 09/03/2017 10  3 - 11 mEq/L Final  . EGFR 09/03/2017 50* >60 ml/min/1.73 m2 Final   eGFR is calculated using the CKD-EPI Creatinine Equation (2009)  . WBC 09/03/2017 2.3* 4.0 - 10.3 10e3/uL Final  . NEUT# 09/03/2017 0.8* 1.5 - 6.5 10e3/uL Final  . HGB 09/03/2017 9.8* 13.0 - 17.1 g/dL Final  . HCT 09/03/2017 29.2* 38.4 - 49.9 % Final  . Platelets 09/03/2017 70* 140 - 400 10e3/uL Final  . MCV 09/03/2017 102.8* 79.3 - 98.0 fL Final  . MCH 09/03/2017 34.5* 27.2 - 33.4 pg Final  . MCHC 09/03/2017 33.6  32.0 - 36.0 g/dL Final  . RBC 09/03/2017 2.84* 4.20 - 5.82 10e6/uL Final  . RDW 09/03/2017 15.6* 11.0 - 14.6 % Final  . lymph# 09/03/2017 1.2  0.9 - 3.3 10e3/uL Final  . MONO# 09/03/2017 0.3  0.1 - 0.9 10e3/uL Final  . Eosinophils Absolute 09/03/2017 0.0  0.0 - 0.5 10e3/uL Final  . Basophils Absolute 09/03/2017 0.0  0.0 - 0.1 10e3/uL Final  . NEUT% 09/03/2017 34.6* 39.0 - 75.0 % Final  . LYMPH% 09/03/2017 51.5* 14.0 - 49.0 % Final  . MONO% 09/03/2017 13.0  0.0 - 14.0 % Final  . EOS%  09/03/2017 0.9  0.0 - 7.0 % Final  . BASO% 09/03/2017 0.0  0.0 - 2.0 % Final       Ardath Sax, MD

## 2017-10-03 NOTE — Assessment & Plan Note (Signed)
84 y.o. male with initial presentation of dysproteinemia with 3.9 g/dL of monoclonal protein in peripheral blood. On presentation, patient did have mild anemia, somewhat decreased renal function, no hypercalcemia, and early neuropathy.we conducted additional assessment demonstrating rising levels of clinical protein, protein was identified as IgG lambda. Patient has depressed levels of IgA and IgM with high levels of IgG exceeding 6 g/dL. Kappa to lambda ratio is 0.03, approaching diagnostic threshold for multiple myeloma by itself. Additional evaluation with PET/CT and bone marrow biopsy confirms presence of multiple myeloma.  First cycle of chemotherapy was complicated by admission pneumonia and cardiac arrhythmia. Patient has fully recovered from that, but treatment was interrupted after patient has received but the single dose of therapy. Initially, patient developed significant symptomatic constipation that now has improved significantly after initiation of lactulose therapy.  Plan: --Proceed with c2d8 bortezomib/dexamethasone --patient plans on relocating to [facility] soon. Will administer as many doses here as possible, patient will resume therapy locally once he relocates to [place].Marland Kitchen

## 2017-11-10 DIAGNOSIS — Z8579 Personal history of other malignant neoplasms of lymphoid, hematopoietic and related tissues: Secondary | ICD-10-CM | POA: Diagnosis not present

## 2017-11-10 DIAGNOSIS — R110 Nausea: Secondary | ICD-10-CM | POA: Diagnosis not present

## 2017-11-10 DIAGNOSIS — R0602 Shortness of breath: Secondary | ICD-10-CM | POA: Diagnosis not present

## 2017-11-12 DIAGNOSIS — R0600 Dyspnea, unspecified: Secondary | ICD-10-CM | POA: Diagnosis not present

## 2017-11-13 DIAGNOSIS — L0390 Cellulitis, unspecified: Secondary | ICD-10-CM | POA: Diagnosis not present

## 2017-11-13 DIAGNOSIS — M6281 Muscle weakness (generalized): Secondary | ICD-10-CM | POA: Diagnosis not present

## 2018-02-16 DIAGNOSIS — M79671 Pain in right foot: Secondary | ICD-10-CM | POA: Diagnosis not present

## 2018-02-16 DIAGNOSIS — M79672 Pain in left foot: Secondary | ICD-10-CM | POA: Diagnosis not present

## 2018-02-16 DIAGNOSIS — B351 Tinea unguium: Secondary | ICD-10-CM | POA: Diagnosis not present

## 2018-03-26 DIAGNOSIS — R05 Cough: Secondary | ICD-10-CM | POA: Diagnosis not present

## 2018-03-26 DIAGNOSIS — R1084 Generalized abdominal pain: Secondary | ICD-10-CM | POA: Diagnosis not present

## 2018-03-26 DIAGNOSIS — C9000 Multiple myeloma not having achieved remission: Secondary | ICD-10-CM | POA: Diagnosis not present

## 2018-03-26 DIAGNOSIS — Z79899 Other long term (current) drug therapy: Secondary | ICD-10-CM | POA: Diagnosis not present

## 2018-03-26 DIAGNOSIS — R1012 Left upper quadrant pain: Secondary | ICD-10-CM | POA: Diagnosis not present

## 2018-03-26 DIAGNOSIS — J449 Chronic obstructive pulmonary disease, unspecified: Secondary | ICD-10-CM | POA: Diagnosis not present

## 2018-03-26 DIAGNOSIS — J9801 Acute bronchospasm: Secondary | ICD-10-CM | POA: Diagnosis not present

## 2018-03-26 DIAGNOSIS — K5900 Constipation, unspecified: Secondary | ICD-10-CM | POA: Diagnosis not present

## 2018-03-26 DIAGNOSIS — R0602 Shortness of breath: Secondary | ICD-10-CM | POA: Diagnosis not present

## 2018-03-26 DIAGNOSIS — R0902 Hypoxemia: Secondary | ICD-10-CM | POA: Diagnosis not present

## 2018-04-19 IMAGING — CT CT L SPINE W/O CM
3 of 6 series · 12 of 33 positions shown, 14 images · non-contrast
Comparison: Same day radiographs of the thoracic spine from
12/28/2016. Lateral CXR 08/05/2016

CLINICAL DATA: T9 fracture

EXAM:
CT THORACIC AND LUMBAR SPINE WITHOUT CONTRAST
TECHNIQUE: Multidetector CT imaging of the thoracic and lumbar spine was
performed without contrast. Multiplanar CT image reconstructions
were also generated.

[Series 3: t-spine st · axial · 0.40mm/px · z∈[-7,+221]mm · 6 of 160 slices shown, 8 images]
[im 23/160  soft-tissue]
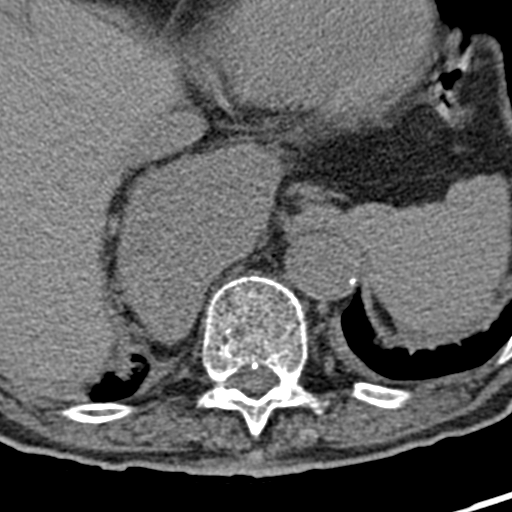
[im 23/160  bone]
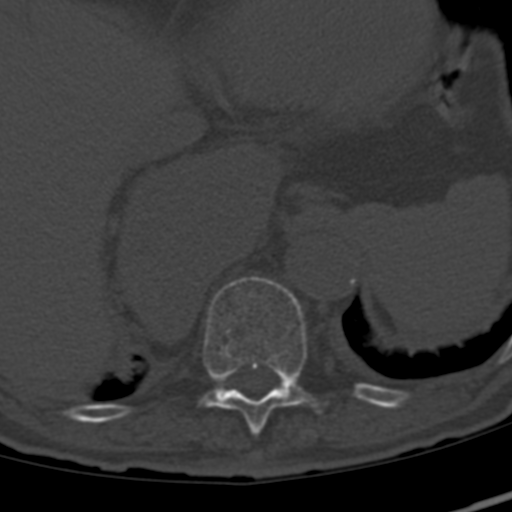
[im 46/160  bone]
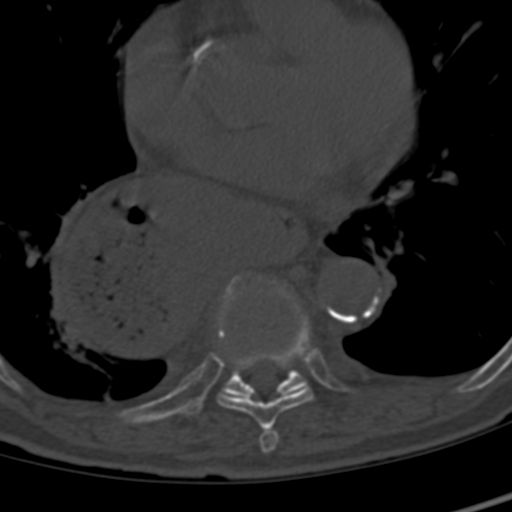
[im 69/160  bone]
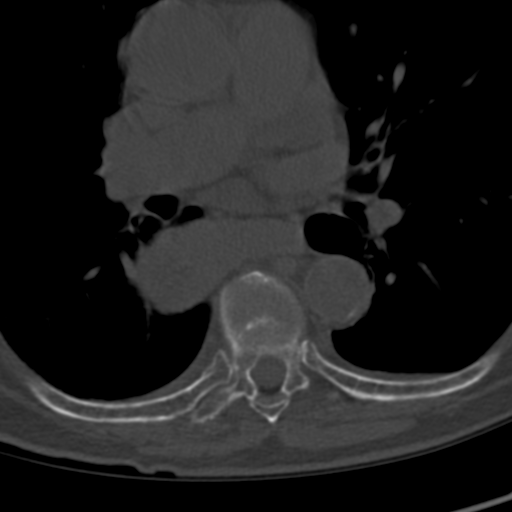
[im 91/160  bone]
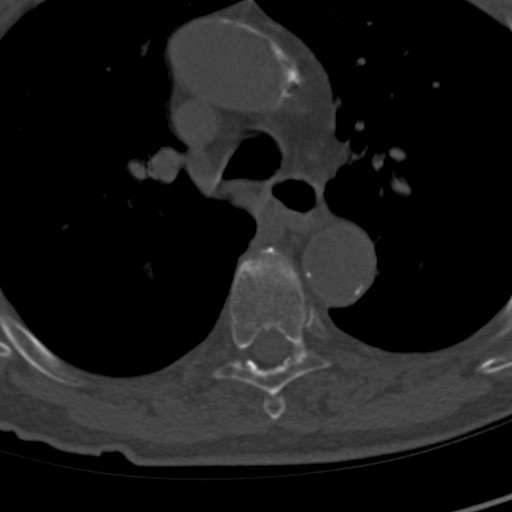
[im 114/160  soft-tissue]
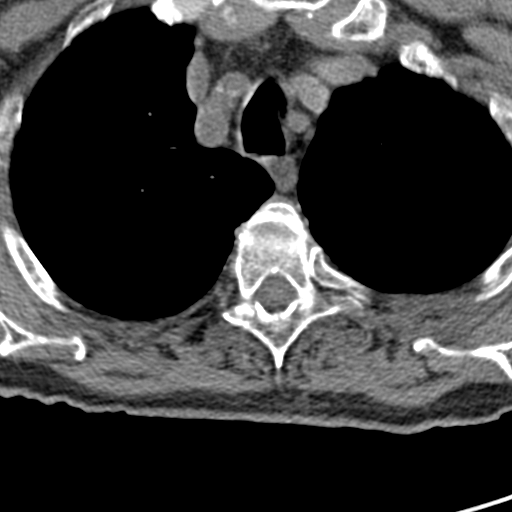
[im 114/160  bone]
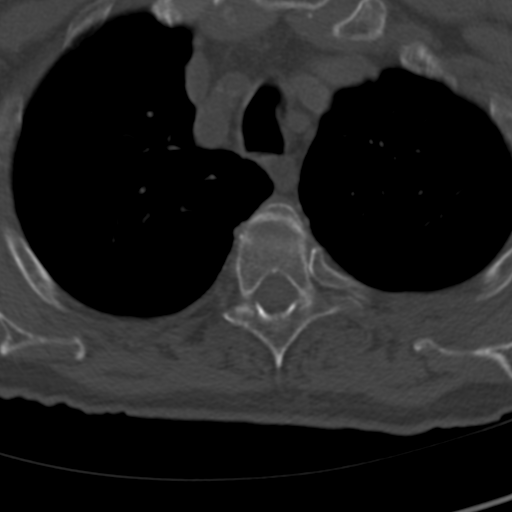
[im 137/160  bone]
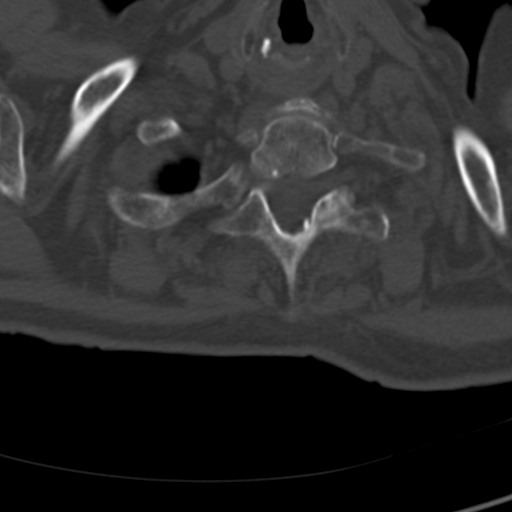

[Series 5: coronal · coronal · 0.24mm/px · 1 of 81 slices shown]
[im 41/81  bone]
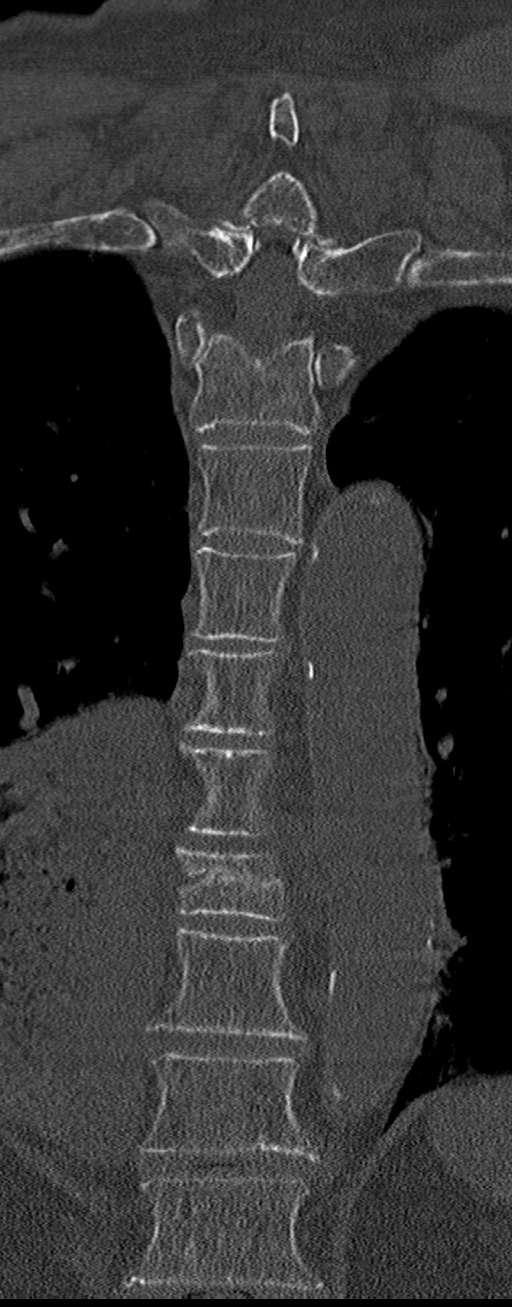

[Series 12: sagittal st · sagittal · 0.27mm/px · 5 of 63 slices shown]
[im 11/63  bone]
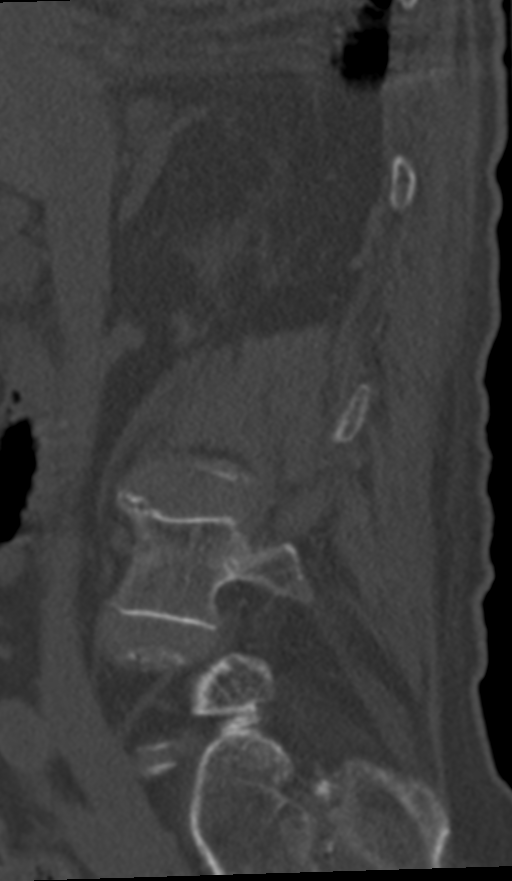
[im 21/63  bone]
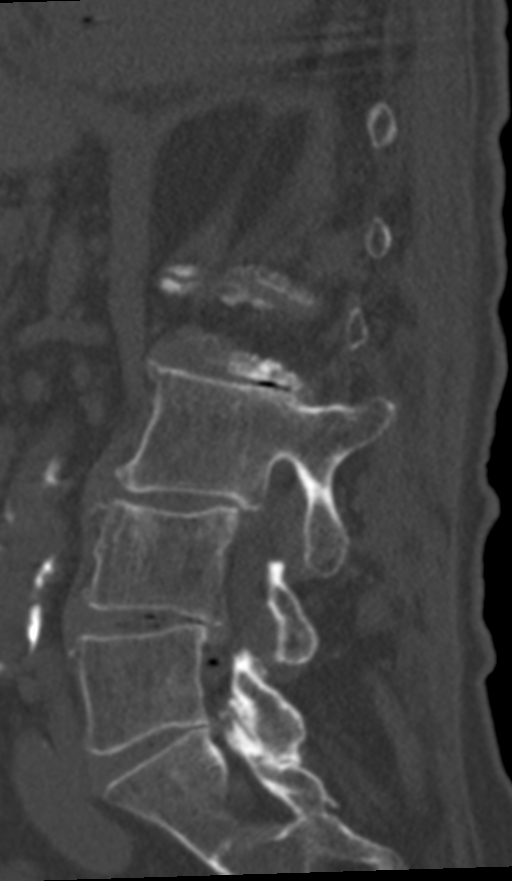
[im 32/63  bone]
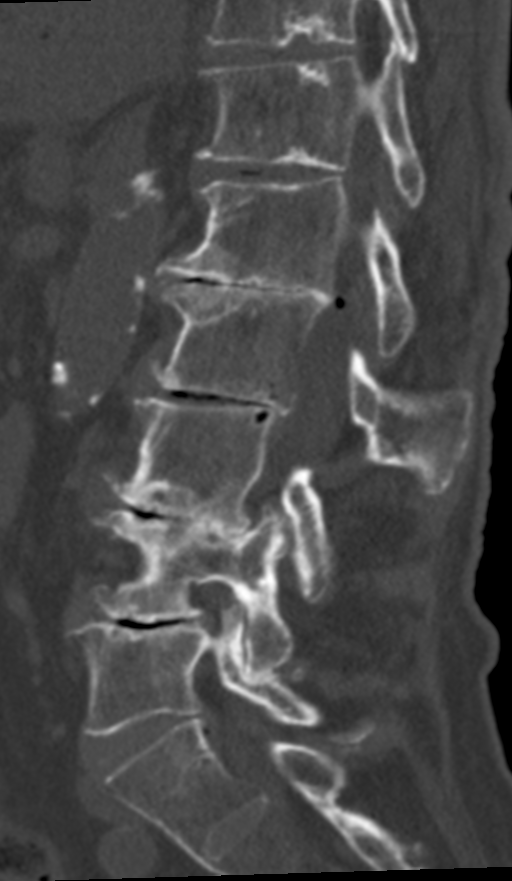
[im 42/63  bone]
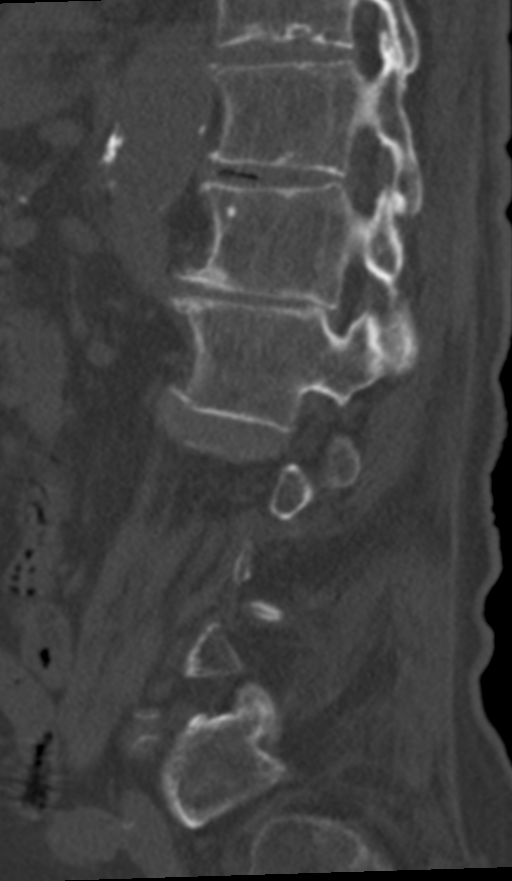
[im 52/63  bone]
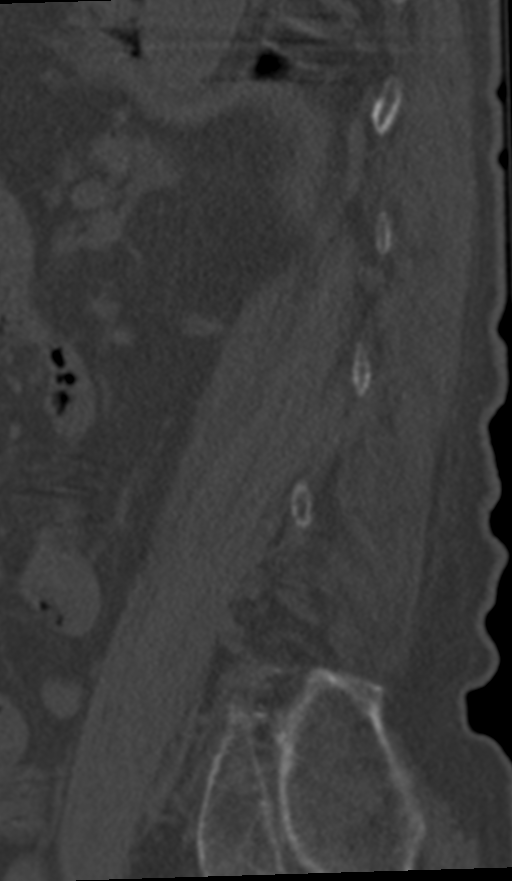

[12 of 33 positions shown; findings below may reference images not displayed]

FINDINGS: CT THORACIC SPINE FINDINGS

Alignment: Maintained thoracic curvature.

Vertebrae: Burst fracture with 50% height loss of the T9 vertebral
body with 3 mm of retropulsion of the posterior cortex. Eccentric
vertical striations noted of the T5 through T11 vertebral bodies
consistent with osteopenia. Schmorl's nodes noted along the inferior
endplate of T8, T9 and T10

Paraspinal and other soft tissues: Mild paraspinal soft tissue edema
about the T9 fracture. No focal paraspinal hematoma. Findings would
suggest a recent fracture. Moderate-sized hiatal hernia. Coronary
arteriosclerosis and aortic atherosclerosis.

Disc levels: Degenerative disc disease with disc space narrowing of
the included lower cervical spine from C4-5, C5-6 and C6-7. No focal
thoracic spine disc herniations. Schmorl's nodes along the inferior
endplates of T7, T8, T10 and T11.

CT LUMBAR SPINE FINDINGS

Segmentation: 5 lumbar type vertebrae.

Alignment: Dextroscoliosis of the lumbar spine with apex at L3.

Vertebrae: No acute fracture.

Paraspinal and other soft tissues: No paraspinal soft tissue
hematoma or mass. No adenopathy.

Disc levels: Schmorl's nodes noted at T11 and T12. There is
degenerative disc disease at all levels of the lumbar spine with
associated facet arthropathy. Left lateral disc bulge at L2-3. There
is grade 1 retrolisthesis of L2 on L3, L3 on L4 and L4 and L5.
IMPRESSION: CT THORACIC SPINE IMPRESSION

Burst type fracture with 50% height loss of the T9 vertebral body
with 3 mm retropulsion causing mild bony central canal stenosis.

CT LUMBAR SPINE IMPRESSION

Dextroscoliosis with lumbar spondylosis. Retrolisthesis of L2 on L3,
L3 on L4 and L4 on L5 grade 1. No acute lumbar spine fracture.

## 2018-04-20 DIAGNOSIS — W57XXXA Bitten or stung by nonvenomous insect and other nonvenomous arthropods, initial encounter: Secondary | ICD-10-CM | POA: Diagnosis not present

## 2018-04-20 DIAGNOSIS — L309 Dermatitis, unspecified: Secondary | ICD-10-CM | POA: Diagnosis not present

## 2018-04-21 IMAGING — DX DG ABDOMEN ACUTE W/ 1V CHEST
3 series · 3 of 3 positions shown · non-contrast
Comparison: CT abdomen and pelvis 09/19/2004. Chest x-rays
08/05/2016, 04/14/2016 and earlier.

CLINICAL DATA: 87-year-old presenting with generalized weakness and
constipation.

EXAM:
DG ABDOMEN ACUTE W/ 1V CHEST

[x chest ap]
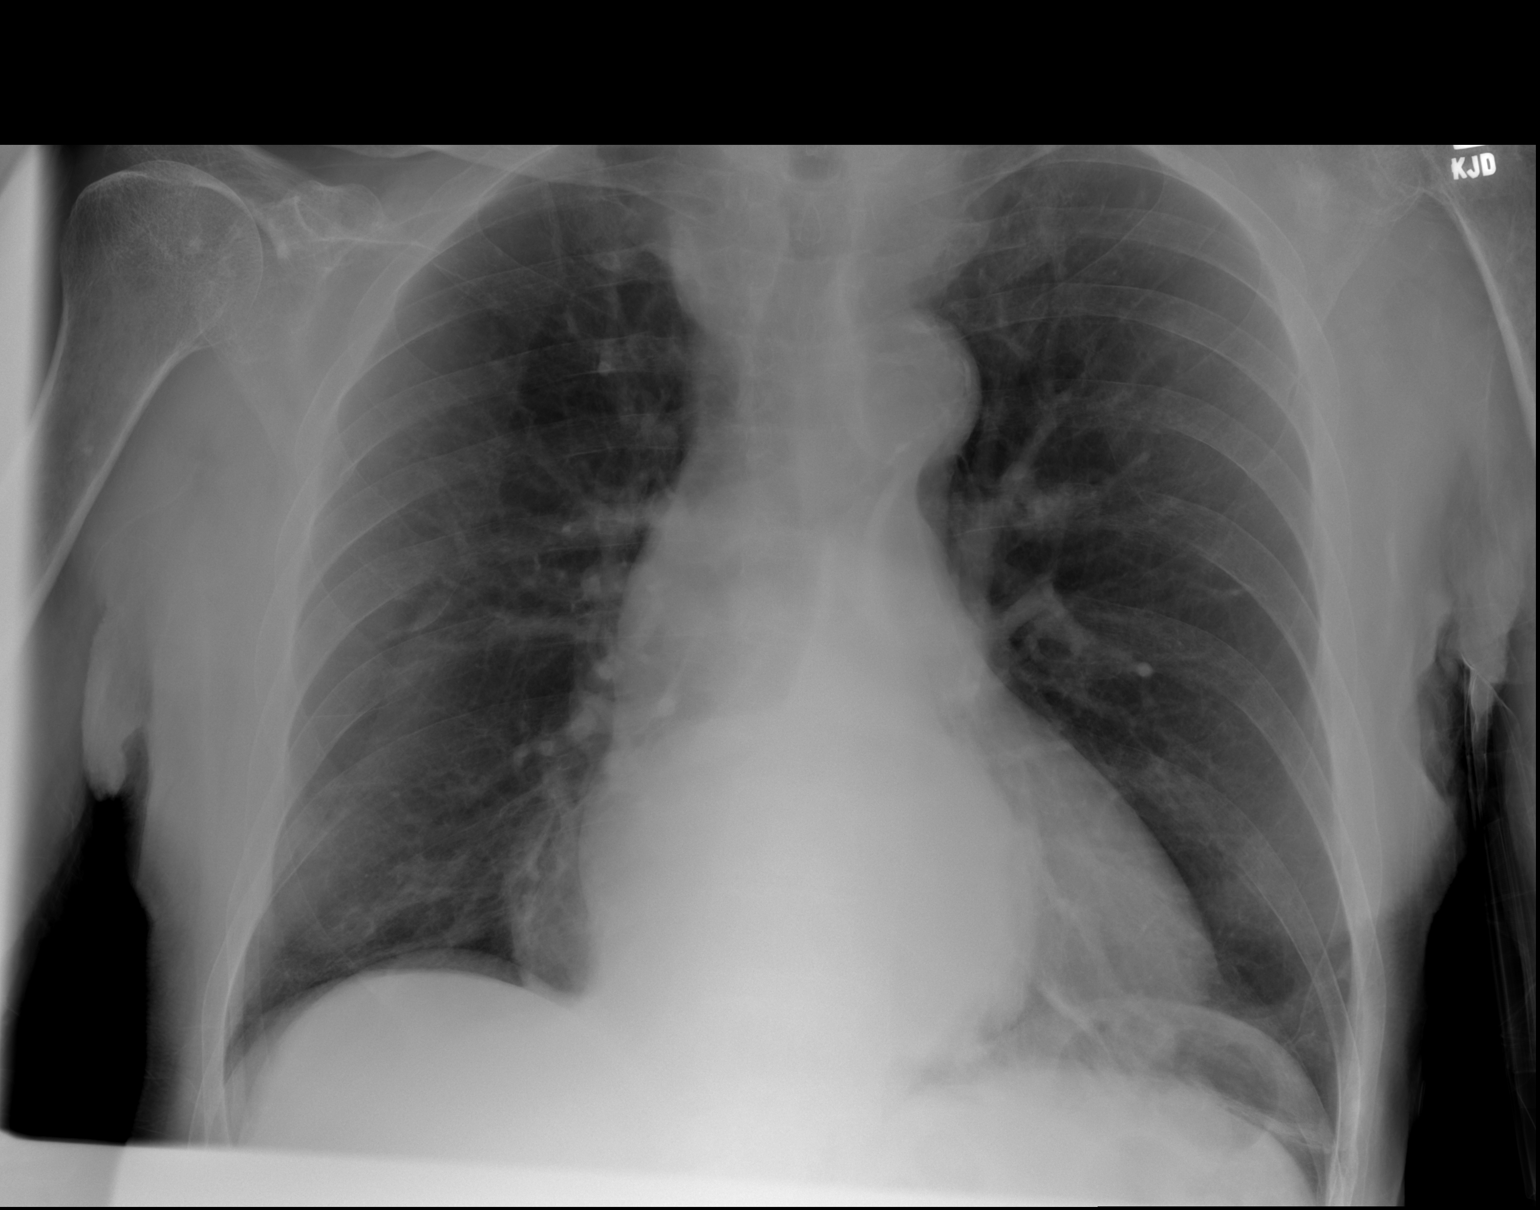

[x abdomen supine (1 of 2)]
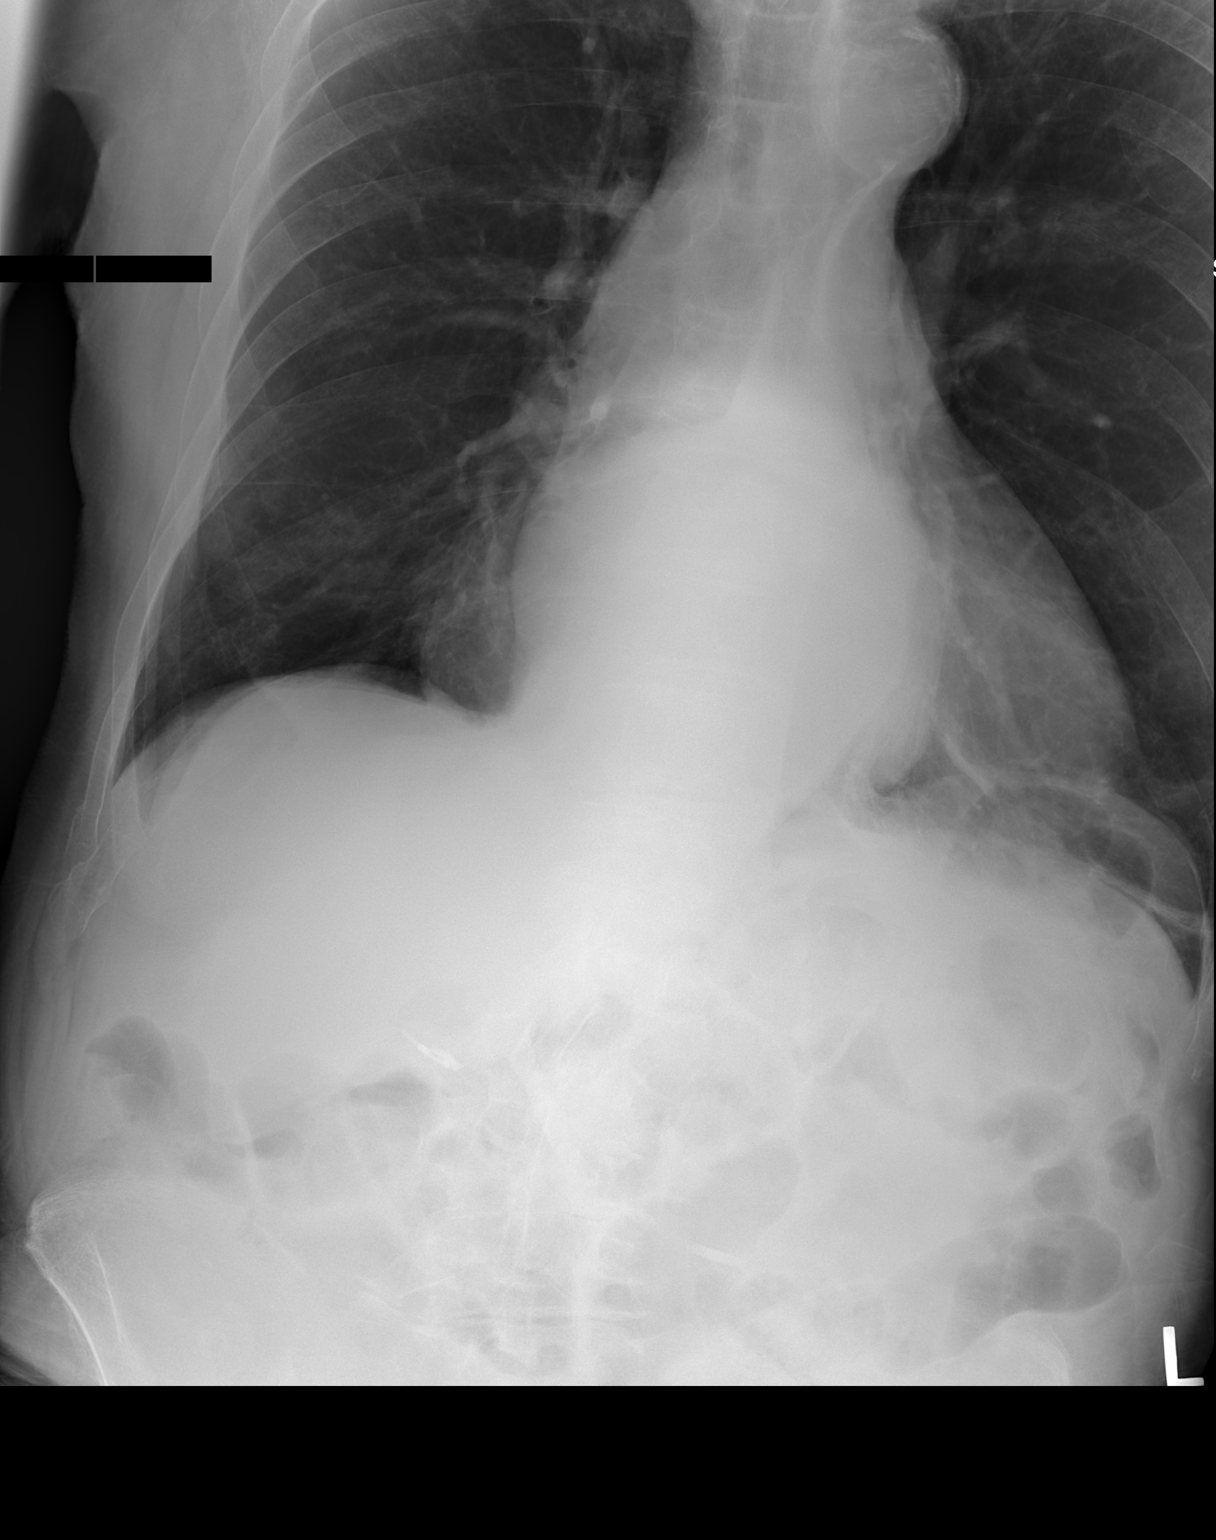

[x abdomen supine (2 of 2)]
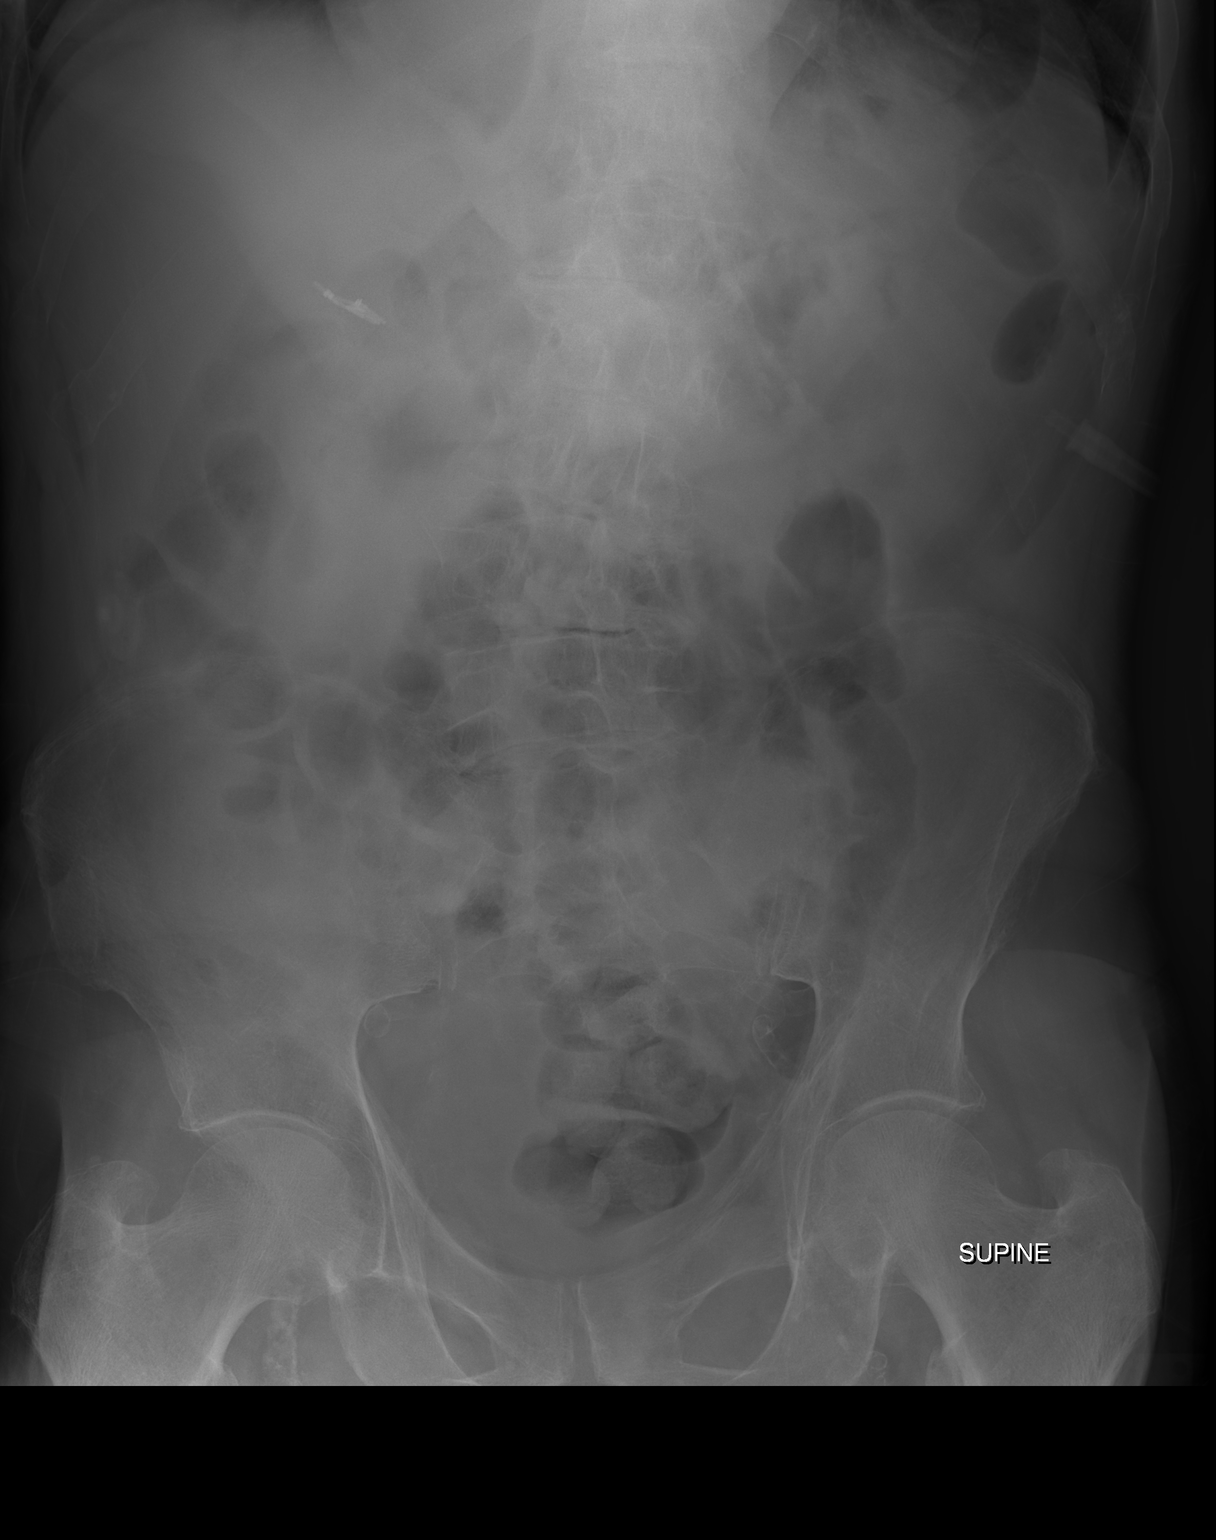

[3 of 3 positions shown; findings below may reference images not displayed]

FINDINGS: Gas within normal-caliber loops of small bowel throughout the
abdomen and pelvis. Gas and expected stool burden throughout normal
caliber colon. No visible opaque urinary tract calculi. Surgical
clips in the right upper quadrant from prior cholecystectomy. No
evidence of free intraperitoneal air or significant air-fluid levels
on the erect image. Aortoiliofemoral atherosclerosis. Lumbar
dextroscoliosis. Severe degenerative changes throughout the lumbar
spine. Severe osseous demineralization.

Cardiac silhouette moderately enlarged for AP technique, increased
in size since 6914. Thoracic aorta atherosclerotic, unchanged. Large
hiatal hernia, increased in size. Paratracheal opacity in the
superior mediastinum is likely related to the combination of thyroid
gland enlargement and mediastinal fat better seen on the AP image.
Lungs clear. Bronchovascular markings normal. Pulmonary vascularity
normal. No visible pleural effusions. No pneumothorax.
IMPRESSION: 1. No acute abdominal abnormality.
2. Moderate cardiomegaly, with interval increase in heart size since
August 2016.  No acute cardiopulmonary disease.
4. Large hiatal hernia.

## 2018-04-22 ENCOUNTER — Ambulatory Visit: Payer: Medicare Other

## 2018-05-01 DIAGNOSIS — K5900 Constipation, unspecified: Secondary | ICD-10-CM | POA: Diagnosis not present

## 2018-06-03 DIAGNOSIS — N3001 Acute cystitis with hematuria: Secondary | ICD-10-CM | POA: Diagnosis not present

## 2018-06-03 DIAGNOSIS — N3000 Acute cystitis without hematuria: Secondary | ICD-10-CM | POA: Diagnosis not present

## 2018-06-08 DIAGNOSIS — M79672 Pain in left foot: Secondary | ICD-10-CM | POA: Diagnosis not present

## 2018-06-08 DIAGNOSIS — B351 Tinea unguium: Secondary | ICD-10-CM | POA: Diagnosis not present

## 2018-06-08 DIAGNOSIS — M79671 Pain in right foot: Secondary | ICD-10-CM | POA: Diagnosis not present

## 2018-07-10 DIAGNOSIS — H65191 Other acute nonsuppurative otitis media, right ear: Secondary | ICD-10-CM | POA: Diagnosis not present

## 2018-07-10 DIAGNOSIS — H60501 Unspecified acute noninfective otitis externa, right ear: Secondary | ICD-10-CM | POA: Diagnosis not present

## 2018-07-12 DIAGNOSIS — B0230 Zoster ocular disease, unspecified: Secondary | ICD-10-CM | POA: Diagnosis not present

## 2018-07-12 DIAGNOSIS — H6021 Malignant otitis externa, right ear: Secondary | ICD-10-CM | POA: Diagnosis not present

## 2018-07-12 DIAGNOSIS — I10 Essential (primary) hypertension: Secondary | ICD-10-CM | POA: Diagnosis not present

## 2018-07-12 DIAGNOSIS — H60501 Unspecified acute noninfective otitis externa, right ear: Secondary | ICD-10-CM | POA: Diagnosis not present

## 2018-07-12 DIAGNOSIS — T451X5A Adverse effect of antineoplastic and immunosuppressive drugs, initial encounter: Secondary | ICD-10-CM | POA: Diagnosis not present

## 2018-07-12 DIAGNOSIS — B029 Zoster without complications: Secondary | ICD-10-CM | POA: Diagnosis not present

## 2018-07-12 DIAGNOSIS — H6091 Unspecified otitis externa, right ear: Secondary | ICD-10-CM | POA: Diagnosis not present

## 2018-07-12 DIAGNOSIS — H01003 Unspecified blepharitis right eye, unspecified eyelid: Secondary | ICD-10-CM | POA: Diagnosis not present

## 2018-07-12 DIAGNOSIS — H269 Unspecified cataract: Secondary | ICD-10-CM | POA: Diagnosis not present

## 2018-07-12 DIAGNOSIS — D61810 Antineoplastic chemotherapy induced pancytopenia: Secondary | ICD-10-CM | POA: Diagnosis not present

## 2018-07-12 DIAGNOSIS — E785 Hyperlipidemia, unspecified: Secondary | ICD-10-CM | POA: Diagnosis not present

## 2018-07-12 DIAGNOSIS — L03211 Cellulitis of face: Secondary | ICD-10-CM | POA: Diagnosis not present

## 2018-07-12 DIAGNOSIS — Z66 Do not resuscitate: Secondary | ICD-10-CM | POA: Diagnosis not present

## 2018-07-12 DIAGNOSIS — K5909 Other constipation: Secondary | ICD-10-CM | POA: Diagnosis not present

## 2018-07-12 DIAGNOSIS — H9201 Otalgia, right ear: Secondary | ICD-10-CM | POA: Diagnosis not present

## 2018-07-12 DIAGNOSIS — N179 Acute kidney failure, unspecified: Secondary | ICD-10-CM | POA: Diagnosis not present

## 2018-07-12 DIAGNOSIS — L539 Erythematous condition, unspecified: Secondary | ICD-10-CM | POA: Diagnosis not present

## 2018-07-12 DIAGNOSIS — C9000 Multiple myeloma not having achieved remission: Secondary | ICD-10-CM | POA: Diagnosis not present

## 2018-07-12 DIAGNOSIS — R220 Localized swelling, mass and lump, head: Secondary | ICD-10-CM | POA: Diagnosis not present

## 2018-07-12 DIAGNOSIS — J449 Chronic obstructive pulmonary disease, unspecified: Secondary | ICD-10-CM | POA: Diagnosis not present

## 2018-07-13 DIAGNOSIS — H9201 Otalgia, right ear: Secondary | ICD-10-CM | POA: Diagnosis not present

## 2018-07-13 DIAGNOSIS — Z66 Do not resuscitate: Secondary | ICD-10-CM | POA: Diagnosis present

## 2018-07-13 DIAGNOSIS — E785 Hyperlipidemia, unspecified: Secondary | ICD-10-CM | POA: Diagnosis present

## 2018-07-13 DIAGNOSIS — N179 Acute kidney failure, unspecified: Secondary | ICD-10-CM | POA: Diagnosis present

## 2018-07-13 DIAGNOSIS — B0223 Postherpetic polyneuropathy: Secondary | ICD-10-CM | POA: Diagnosis not present

## 2018-07-13 DIAGNOSIS — H01003 Unspecified blepharitis right eye, unspecified eyelid: Secondary | ICD-10-CM | POA: Diagnosis present

## 2018-07-13 DIAGNOSIS — T451X5A Adverse effect of antineoplastic and immunosuppressive drugs, initial encounter: Secondary | ICD-10-CM | POA: Diagnosis present

## 2018-07-13 DIAGNOSIS — H6091 Unspecified otitis externa, right ear: Secondary | ICD-10-CM | POA: Diagnosis present

## 2018-07-13 DIAGNOSIS — H269 Unspecified cataract: Secondary | ICD-10-CM | POA: Diagnosis present

## 2018-07-13 DIAGNOSIS — B0221 Postherpetic geniculate ganglionitis: Secondary | ICD-10-CM | POA: Diagnosis not present

## 2018-07-13 DIAGNOSIS — C9000 Multiple myeloma not having achieved remission: Secondary | ICD-10-CM | POA: Diagnosis not present

## 2018-07-13 DIAGNOSIS — I10 Essential (primary) hypertension: Secondary | ICD-10-CM | POA: Diagnosis present

## 2018-07-13 DIAGNOSIS — J449 Chronic obstructive pulmonary disease, unspecified: Secondary | ICD-10-CM | POA: Diagnosis present

## 2018-07-13 DIAGNOSIS — B028 Zoster with other complications: Secondary | ICD-10-CM | POA: Diagnosis not present

## 2018-07-13 DIAGNOSIS — D61810 Antineoplastic chemotherapy induced pancytopenia: Secondary | ICD-10-CM | POA: Diagnosis present

## 2018-07-13 DIAGNOSIS — D899 Disorder involving the immune mechanism, unspecified: Secondary | ICD-10-CM | POA: Diagnosis not present

## 2018-07-13 DIAGNOSIS — K5909 Other constipation: Secondary | ICD-10-CM | POA: Diagnosis present

## 2018-07-13 DIAGNOSIS — B0230 Zoster ocular disease, unspecified: Secondary | ICD-10-CM | POA: Diagnosis present

## 2018-07-13 DIAGNOSIS — R233 Spontaneous ecchymoses: Secondary | ICD-10-CM | POA: Diagnosis not present

## 2018-07-13 DIAGNOSIS — L282 Other prurigo: Secondary | ICD-10-CM | POA: Diagnosis not present

## 2018-07-20 DIAGNOSIS — H6091 Unspecified otitis externa, right ear: Secondary | ICD-10-CM | POA: Diagnosis not present

## 2018-07-20 DIAGNOSIS — D61810 Antineoplastic chemotherapy induced pancytopenia: Secondary | ICD-10-CM | POA: Diagnosis not present

## 2018-07-20 DIAGNOSIS — E785 Hyperlipidemia, unspecified: Secondary | ICD-10-CM | POA: Diagnosis not present

## 2018-07-20 DIAGNOSIS — J449 Chronic obstructive pulmonary disease, unspecified: Secondary | ICD-10-CM | POA: Diagnosis not present

## 2018-07-20 DIAGNOSIS — I10 Essential (primary) hypertension: Secondary | ICD-10-CM | POA: Diagnosis not present

## 2018-07-20 DIAGNOSIS — M810 Age-related osteoporosis without current pathological fracture: Secondary | ICD-10-CM | POA: Diagnosis not present

## 2018-07-20 DIAGNOSIS — C9000 Multiple myeloma not having achieved remission: Secondary | ICD-10-CM | POA: Diagnosis not present

## 2018-07-20 DIAGNOSIS — Z7952 Long term (current) use of systemic steroids: Secondary | ICD-10-CM | POA: Diagnosis not present

## 2018-07-20 DIAGNOSIS — B029 Zoster without complications: Secondary | ICD-10-CM | POA: Diagnosis not present

## 2018-07-20 DIAGNOSIS — K5900 Constipation, unspecified: Secondary | ICD-10-CM | POA: Diagnosis not present

## 2018-07-21 DIAGNOSIS — H6091 Unspecified otitis externa, right ear: Secondary | ICD-10-CM | POA: Diagnosis not present

## 2018-07-21 DIAGNOSIS — C9000 Multiple myeloma not having achieved remission: Secondary | ICD-10-CM | POA: Diagnosis not present

## 2018-07-21 DIAGNOSIS — E785 Hyperlipidemia, unspecified: Secondary | ICD-10-CM | POA: Diagnosis not present

## 2018-07-21 DIAGNOSIS — B029 Zoster without complications: Secondary | ICD-10-CM | POA: Diagnosis not present

## 2018-07-21 DIAGNOSIS — I10 Essential (primary) hypertension: Secondary | ICD-10-CM | POA: Diagnosis not present

## 2018-07-21 DIAGNOSIS — J449 Chronic obstructive pulmonary disease, unspecified: Secondary | ICD-10-CM | POA: Diagnosis not present

## 2018-07-22 DIAGNOSIS — B029 Zoster without complications: Secondary | ICD-10-CM | POA: Diagnosis not present

## 2018-07-22 DIAGNOSIS — J449 Chronic obstructive pulmonary disease, unspecified: Secondary | ICD-10-CM | POA: Diagnosis not present

## 2018-07-22 DIAGNOSIS — H6091 Unspecified otitis externa, right ear: Secondary | ICD-10-CM | POA: Diagnosis not present

## 2018-07-22 DIAGNOSIS — C9000 Multiple myeloma not having achieved remission: Secondary | ICD-10-CM | POA: Diagnosis not present

## 2018-07-22 DIAGNOSIS — E785 Hyperlipidemia, unspecified: Secondary | ICD-10-CM | POA: Diagnosis not present

## 2018-07-22 DIAGNOSIS — I10 Essential (primary) hypertension: Secondary | ICD-10-CM | POA: Diagnosis not present

## 2018-07-23 DIAGNOSIS — R05 Cough: Secondary | ICD-10-CM | POA: Diagnosis not present

## 2018-07-23 DIAGNOSIS — J449 Chronic obstructive pulmonary disease, unspecified: Secondary | ICD-10-CM | POA: Diagnosis not present

## 2018-07-23 DIAGNOSIS — E785 Hyperlipidemia, unspecified: Secondary | ICD-10-CM | POA: Diagnosis not present

## 2018-07-23 DIAGNOSIS — Z87891 Personal history of nicotine dependence: Secondary | ICD-10-CM | POA: Diagnosis not present

## 2018-07-23 DIAGNOSIS — R4189 Other symptoms and signs involving cognitive functions and awareness: Secondary | ICD-10-CM | POA: Diagnosis not present

## 2018-07-23 DIAGNOSIS — I10 Essential (primary) hypertension: Secondary | ICD-10-CM | POA: Diagnosis not present

## 2018-07-23 DIAGNOSIS — F329 Major depressive disorder, single episode, unspecified: Secondary | ICD-10-CM | POA: Diagnosis not present

## 2018-07-23 DIAGNOSIS — R531 Weakness: Secondary | ICD-10-CM | POA: Diagnosis not present

## 2018-07-23 DIAGNOSIS — C9000 Multiple myeloma not having achieved remission: Secondary | ICD-10-CM | POA: Diagnosis not present

## 2018-07-23 DIAGNOSIS — B029 Zoster without complications: Secondary | ICD-10-CM | POA: Diagnosis not present

## 2018-07-23 DIAGNOSIS — H6091 Unspecified otitis externa, right ear: Secondary | ICD-10-CM | POA: Diagnosis not present

## 2018-07-30 DIAGNOSIS — I10 Essential (primary) hypertension: Secondary | ICD-10-CM | POA: Diagnosis not present

## 2018-07-30 DIAGNOSIS — E785 Hyperlipidemia, unspecified: Secondary | ICD-10-CM | POA: Diagnosis not present

## 2018-07-30 DIAGNOSIS — J449 Chronic obstructive pulmonary disease, unspecified: Secondary | ICD-10-CM | POA: Diagnosis not present

## 2018-07-30 DIAGNOSIS — B029 Zoster without complications: Secondary | ICD-10-CM | POA: Diagnosis not present

## 2018-07-30 DIAGNOSIS — C9000 Multiple myeloma not having achieved remission: Secondary | ICD-10-CM | POA: Diagnosis not present

## 2018-07-30 DIAGNOSIS — H6091 Unspecified otitis externa, right ear: Secondary | ICD-10-CM | POA: Diagnosis not present

## 2018-07-31 DIAGNOSIS — C9000 Multiple myeloma not having achieved remission: Secondary | ICD-10-CM | POA: Diagnosis not present

## 2018-07-31 DIAGNOSIS — G500 Trigeminal neuralgia: Secondary | ICD-10-CM | POA: Diagnosis not present

## 2018-07-31 DIAGNOSIS — B029 Zoster without complications: Secondary | ICD-10-CM | POA: Diagnosis not present

## 2018-07-31 DIAGNOSIS — H9201 Otalgia, right ear: Secondary | ICD-10-CM | POA: Diagnosis not present

## 2018-07-31 DIAGNOSIS — H6091 Unspecified otitis externa, right ear: Secondary | ICD-10-CM | POA: Diagnosis not present

## 2018-07-31 DIAGNOSIS — J449 Chronic obstructive pulmonary disease, unspecified: Secondary | ICD-10-CM | POA: Diagnosis not present

## 2018-07-31 DIAGNOSIS — E785 Hyperlipidemia, unspecified: Secondary | ICD-10-CM | POA: Diagnosis not present

## 2018-07-31 DIAGNOSIS — I10 Essential (primary) hypertension: Secondary | ICD-10-CM | POA: Diagnosis not present

## 2018-08-03 DIAGNOSIS — H6091 Unspecified otitis externa, right ear: Secondary | ICD-10-CM | POA: Diagnosis not present

## 2018-08-03 DIAGNOSIS — I10 Essential (primary) hypertension: Secondary | ICD-10-CM | POA: Diagnosis not present

## 2018-08-03 DIAGNOSIS — B029 Zoster without complications: Secondary | ICD-10-CM | POA: Diagnosis not present

## 2018-08-03 DIAGNOSIS — E785 Hyperlipidemia, unspecified: Secondary | ICD-10-CM | POA: Diagnosis not present

## 2018-08-03 DIAGNOSIS — J449 Chronic obstructive pulmonary disease, unspecified: Secondary | ICD-10-CM | POA: Diagnosis not present

## 2018-08-03 DIAGNOSIS — C9000 Multiple myeloma not having achieved remission: Secondary | ICD-10-CM | POA: Diagnosis not present

## 2018-08-04 DIAGNOSIS — E785 Hyperlipidemia, unspecified: Secondary | ICD-10-CM | POA: Diagnosis not present

## 2018-08-04 DIAGNOSIS — B029 Zoster without complications: Secondary | ICD-10-CM | POA: Diagnosis not present

## 2018-08-04 DIAGNOSIS — H6091 Unspecified otitis externa, right ear: Secondary | ICD-10-CM | POA: Diagnosis not present

## 2018-08-04 DIAGNOSIS — J449 Chronic obstructive pulmonary disease, unspecified: Secondary | ICD-10-CM | POA: Diagnosis not present

## 2018-08-04 DIAGNOSIS — C9000 Multiple myeloma not having achieved remission: Secondary | ICD-10-CM | POA: Diagnosis not present

## 2018-08-04 DIAGNOSIS — I10 Essential (primary) hypertension: Secondary | ICD-10-CM | POA: Diagnosis not present

## 2018-08-05 DIAGNOSIS — J449 Chronic obstructive pulmonary disease, unspecified: Secondary | ICD-10-CM | POA: Diagnosis not present

## 2018-08-05 DIAGNOSIS — H6091 Unspecified otitis externa, right ear: Secondary | ICD-10-CM | POA: Diagnosis not present

## 2018-08-05 DIAGNOSIS — B029 Zoster without complications: Secondary | ICD-10-CM | POA: Diagnosis not present

## 2018-08-05 DIAGNOSIS — C9000 Multiple myeloma not having achieved remission: Secondary | ICD-10-CM | POA: Diagnosis not present

## 2018-08-05 DIAGNOSIS — I10 Essential (primary) hypertension: Secondary | ICD-10-CM | POA: Diagnosis not present

## 2018-08-05 DIAGNOSIS — E785 Hyperlipidemia, unspecified: Secondary | ICD-10-CM | POA: Diagnosis not present

## 2018-08-06 DIAGNOSIS — J449 Chronic obstructive pulmonary disease, unspecified: Secondary | ICD-10-CM | POA: Diagnosis not present

## 2018-08-06 DIAGNOSIS — I10 Essential (primary) hypertension: Secondary | ICD-10-CM | POA: Diagnosis not present

## 2018-08-06 DIAGNOSIS — C9000 Multiple myeloma not having achieved remission: Secondary | ICD-10-CM | POA: Diagnosis not present

## 2018-08-06 DIAGNOSIS — H6091 Unspecified otitis externa, right ear: Secondary | ICD-10-CM | POA: Diagnosis not present

## 2018-08-06 DIAGNOSIS — E785 Hyperlipidemia, unspecified: Secondary | ICD-10-CM | POA: Diagnosis not present

## 2018-08-06 DIAGNOSIS — B029 Zoster without complications: Secondary | ICD-10-CM | POA: Diagnosis not present

## 2018-08-09 DIAGNOSIS — J449 Chronic obstructive pulmonary disease, unspecified: Secondary | ICD-10-CM | POA: Diagnosis not present

## 2018-08-09 DIAGNOSIS — B029 Zoster without complications: Secondary | ICD-10-CM | POA: Diagnosis not present

## 2018-08-09 DIAGNOSIS — C9000 Multiple myeloma not having achieved remission: Secondary | ICD-10-CM | POA: Diagnosis not present

## 2018-08-09 DIAGNOSIS — I10 Essential (primary) hypertension: Secondary | ICD-10-CM | POA: Diagnosis not present

## 2018-08-09 DIAGNOSIS — H6091 Unspecified otitis externa, right ear: Secondary | ICD-10-CM | POA: Diagnosis not present

## 2018-08-09 DIAGNOSIS — E785 Hyperlipidemia, unspecified: Secondary | ICD-10-CM | POA: Diagnosis not present

## 2018-08-10 DIAGNOSIS — E785 Hyperlipidemia, unspecified: Secondary | ICD-10-CM | POA: Diagnosis not present

## 2018-08-10 DIAGNOSIS — J449 Chronic obstructive pulmonary disease, unspecified: Secondary | ICD-10-CM | POA: Diagnosis not present

## 2018-08-10 DIAGNOSIS — I10 Essential (primary) hypertension: Secondary | ICD-10-CM | POA: Diagnosis not present

## 2018-08-10 DIAGNOSIS — H6091 Unspecified otitis externa, right ear: Secondary | ICD-10-CM | POA: Diagnosis not present

## 2018-08-10 DIAGNOSIS — C9000 Multiple myeloma not having achieved remission: Secondary | ICD-10-CM | POA: Diagnosis not present

## 2018-08-10 DIAGNOSIS — B029 Zoster without complications: Secondary | ICD-10-CM | POA: Diagnosis not present

## 2018-08-11 DIAGNOSIS — F329 Major depressive disorder, single episode, unspecified: Secondary | ICD-10-CM | POA: Diagnosis not present

## 2018-08-11 DIAGNOSIS — R42 Dizziness and giddiness: Secondary | ICD-10-CM | POA: Diagnosis not present

## 2018-08-11 DIAGNOSIS — E782 Mixed hyperlipidemia: Secondary | ICD-10-CM | POA: Diagnosis not present

## 2018-08-11 DIAGNOSIS — R338 Other retention of urine: Secondary | ICD-10-CM | POA: Diagnosis present

## 2018-08-11 DIAGNOSIS — B0229 Other postherpetic nervous system involvement: Secondary | ICD-10-CM | POA: Diagnosis not present

## 2018-08-11 DIAGNOSIS — I9589 Other hypotension: Secondary | ICD-10-CM | POA: Diagnosis not present

## 2018-08-11 DIAGNOSIS — E46 Unspecified protein-calorie malnutrition: Secondary | ICD-10-CM | POA: Diagnosis not present

## 2018-08-11 DIAGNOSIS — R339 Retention of urine, unspecified: Secondary | ICD-10-CM | POA: Diagnosis not present

## 2018-08-11 DIAGNOSIS — G8929 Other chronic pain: Secondary | ICD-10-CM | POA: Diagnosis present

## 2018-08-11 DIAGNOSIS — K219 Gastro-esophageal reflux disease without esophagitis: Secondary | ICD-10-CM | POA: Diagnosis present

## 2018-08-11 DIAGNOSIS — R531 Weakness: Secondary | ICD-10-CM | POA: Diagnosis not present

## 2018-08-11 DIAGNOSIS — B029 Zoster without complications: Secondary | ICD-10-CM | POA: Diagnosis present

## 2018-08-11 DIAGNOSIS — E869 Volume depletion, unspecified: Secondary | ICD-10-CM | POA: Diagnosis present

## 2018-08-11 DIAGNOSIS — G4700 Insomnia, unspecified: Secondary | ICD-10-CM | POA: Diagnosis present

## 2018-08-11 DIAGNOSIS — E785 Hyperlipidemia, unspecified: Secondary | ICD-10-CM | POA: Diagnosis present

## 2018-08-11 DIAGNOSIS — K5909 Other constipation: Secondary | ICD-10-CM | POA: Diagnosis present

## 2018-08-11 DIAGNOSIS — Z87891 Personal history of nicotine dependence: Secondary | ICD-10-CM | POA: Diagnosis not present

## 2018-08-11 DIAGNOSIS — J449 Chronic obstructive pulmonary disease, unspecified: Secondary | ICD-10-CM | POA: Diagnosis present

## 2018-08-11 DIAGNOSIS — N401 Enlarged prostate with lower urinary tract symptoms: Secondary | ICD-10-CM | POA: Diagnosis present

## 2018-08-11 DIAGNOSIS — R110 Nausea: Secondary | ICD-10-CM | POA: Diagnosis not present

## 2018-08-11 DIAGNOSIS — F419 Anxiety disorder, unspecified: Secondary | ICD-10-CM | POA: Diagnosis present

## 2018-08-11 DIAGNOSIS — E871 Hypo-osmolality and hyponatremia: Secondary | ICD-10-CM | POA: Diagnosis not present

## 2018-08-11 DIAGNOSIS — R55 Syncope and collapse: Secondary | ICD-10-CM | POA: Diagnosis not present

## 2018-08-11 DIAGNOSIS — I10 Essential (primary) hypertension: Secondary | ICD-10-CM | POA: Diagnosis present

## 2018-08-11 DIAGNOSIS — I951 Orthostatic hypotension: Secondary | ICD-10-CM | POA: Diagnosis present

## 2018-08-11 DIAGNOSIS — C9000 Multiple myeloma not having achieved remission: Secondary | ICD-10-CM | POA: Diagnosis present

## 2018-08-11 DIAGNOSIS — E7800 Pure hypercholesterolemia, unspecified: Secondary | ICD-10-CM | POA: Diagnosis present

## 2018-08-15 DIAGNOSIS — K5909 Other constipation: Secondary | ICD-10-CM | POA: Diagnosis not present

## 2018-08-15 DIAGNOSIS — J45998 Other asthma: Secondary | ICD-10-CM | POA: Diagnosis not present

## 2018-08-15 DIAGNOSIS — J449 Chronic obstructive pulmonary disease, unspecified: Secondary | ICD-10-CM | POA: Diagnosis not present

## 2018-08-15 DIAGNOSIS — N401 Enlarged prostate with lower urinary tract symptoms: Secondary | ICD-10-CM | POA: Diagnosis not present

## 2018-08-15 DIAGNOSIS — E782 Mixed hyperlipidemia: Secondary | ICD-10-CM | POA: Diagnosis not present

## 2018-08-15 DIAGNOSIS — B028 Zoster with other complications: Secondary | ICD-10-CM | POA: Diagnosis not present

## 2018-08-15 DIAGNOSIS — I10 Essential (primary) hypertension: Secondary | ICD-10-CM | POA: Diagnosis not present

## 2018-08-15 DIAGNOSIS — E46 Unspecified protein-calorie malnutrition: Secondary | ICD-10-CM | POA: Diagnosis not present

## 2018-08-15 DIAGNOSIS — R197 Diarrhea, unspecified: Secondary | ICD-10-CM | POA: Diagnosis not present

## 2018-08-15 DIAGNOSIS — Z23 Encounter for immunization: Secondary | ICD-10-CM | POA: Diagnosis not present

## 2018-08-15 DIAGNOSIS — E871 Hypo-osmolality and hyponatremia: Secondary | ICD-10-CM | POA: Diagnosis not present

## 2018-08-15 DIAGNOSIS — F5101 Primary insomnia: Secondary | ICD-10-CM | POA: Diagnosis not present

## 2018-08-15 DIAGNOSIS — F331 Major depressive disorder, recurrent, moderate: Secondary | ICD-10-CM | POA: Diagnosis not present

## 2018-08-15 DIAGNOSIS — K5901 Slow transit constipation: Secondary | ICD-10-CM | POA: Diagnosis not present

## 2018-08-15 DIAGNOSIS — F09 Unspecified mental disorder due to known physiological condition: Secondary | ICD-10-CM | POA: Diagnosis not present

## 2018-08-15 DIAGNOSIS — G8929 Other chronic pain: Secondary | ICD-10-CM | POA: Diagnosis not present

## 2018-08-15 DIAGNOSIS — F329 Major depressive disorder, single episode, unspecified: Secondary | ICD-10-CM | POA: Diagnosis not present

## 2018-08-15 DIAGNOSIS — F064 Anxiety disorder due to known physiological condition: Secondary | ICD-10-CM | POA: Diagnosis not present

## 2018-08-15 DIAGNOSIS — R531 Weakness: Secondary | ICD-10-CM | POA: Diagnosis not present

## 2018-08-15 DIAGNOSIS — G4700 Insomnia, unspecified: Secondary | ICD-10-CM | POA: Diagnosis not present

## 2018-08-15 DIAGNOSIS — C9000 Multiple myeloma not having achieved remission: Secondary | ICD-10-CM | POA: Diagnosis not present

## 2018-08-15 DIAGNOSIS — R338 Other retention of urine: Secondary | ICD-10-CM | POA: Diagnosis not present

## 2018-08-15 DIAGNOSIS — F419 Anxiety disorder, unspecified: Secondary | ICD-10-CM | POA: Diagnosis not present

## 2018-08-15 DIAGNOSIS — I951 Orthostatic hypotension: Secondary | ICD-10-CM | POA: Diagnosis not present

## 2018-08-15 DIAGNOSIS — B0229 Other postherpetic nervous system involvement: Secondary | ICD-10-CM | POA: Diagnosis not present

## 2018-08-15 DIAGNOSIS — K5900 Constipation, unspecified: Secondary | ICD-10-CM | POA: Diagnosis not present

## 2018-08-15 DIAGNOSIS — D630 Anemia in neoplastic disease: Secondary | ICD-10-CM | POA: Diagnosis not present

## 2018-08-15 DIAGNOSIS — R339 Retention of urine, unspecified: Secondary | ICD-10-CM | POA: Diagnosis not present

## 2018-08-15 DIAGNOSIS — R42 Dizziness and giddiness: Secondary | ICD-10-CM | POA: Diagnosis not present

## 2018-08-16 DIAGNOSIS — J449 Chronic obstructive pulmonary disease, unspecified: Secondary | ICD-10-CM | POA: Diagnosis not present

## 2018-08-16 DIAGNOSIS — R339 Retention of urine, unspecified: Secondary | ICD-10-CM | POA: Diagnosis not present

## 2018-08-16 DIAGNOSIS — B028 Zoster with other complications: Secondary | ICD-10-CM | POA: Diagnosis not present

## 2018-08-16 DIAGNOSIS — G8929 Other chronic pain: Secondary | ICD-10-CM | POA: Diagnosis not present

## 2018-08-17 DIAGNOSIS — C9000 Multiple myeloma not having achieved remission: Secondary | ICD-10-CM | POA: Diagnosis not present

## 2018-08-17 DIAGNOSIS — J449 Chronic obstructive pulmonary disease, unspecified: Secondary | ICD-10-CM | POA: Diagnosis not present

## 2018-08-18 DIAGNOSIS — F064 Anxiety disorder due to known physiological condition: Secondary | ICD-10-CM | POA: Diagnosis not present

## 2018-08-18 DIAGNOSIS — G8929 Other chronic pain: Secondary | ICD-10-CM | POA: Diagnosis not present

## 2018-08-18 DIAGNOSIS — G4700 Insomnia, unspecified: Secondary | ICD-10-CM | POA: Diagnosis not present

## 2018-08-18 DIAGNOSIS — D630 Anemia in neoplastic disease: Secondary | ICD-10-CM | POA: Diagnosis not present

## 2018-08-18 DIAGNOSIS — J45998 Other asthma: Secondary | ICD-10-CM | POA: Diagnosis not present

## 2018-08-18 DIAGNOSIS — I10 Essential (primary) hypertension: Secondary | ICD-10-CM | POA: Diagnosis not present

## 2018-08-18 DIAGNOSIS — C9000 Multiple myeloma not having achieved remission: Secondary | ICD-10-CM | POA: Diagnosis not present

## 2018-08-18 DIAGNOSIS — E871 Hypo-osmolality and hyponatremia: Secondary | ICD-10-CM | POA: Diagnosis not present

## 2018-08-18 DIAGNOSIS — K5909 Other constipation: Secondary | ICD-10-CM | POA: Diagnosis not present

## 2018-08-18 DIAGNOSIS — I951 Orthostatic hypotension: Secondary | ICD-10-CM | POA: Diagnosis not present

## 2018-08-19 DIAGNOSIS — K5901 Slow transit constipation: Secondary | ICD-10-CM | POA: Diagnosis not present

## 2018-08-19 DIAGNOSIS — C9000 Multiple myeloma not having achieved remission: Secondary | ICD-10-CM | POA: Diagnosis not present

## 2018-08-19 DIAGNOSIS — J449 Chronic obstructive pulmonary disease, unspecified: Secondary | ICD-10-CM | POA: Diagnosis not present

## 2018-08-20 DIAGNOSIS — J449 Chronic obstructive pulmonary disease, unspecified: Secondary | ICD-10-CM | POA: Diagnosis not present

## 2018-08-20 DIAGNOSIS — C9000 Multiple myeloma not having achieved remission: Secondary | ICD-10-CM | POA: Diagnosis not present

## 2018-08-23 DIAGNOSIS — F09 Unspecified mental disorder due to known physiological condition: Secondary | ICD-10-CM | POA: Diagnosis not present

## 2018-08-23 DIAGNOSIS — F419 Anxiety disorder, unspecified: Secondary | ICD-10-CM | POA: Diagnosis not present

## 2018-08-23 DIAGNOSIS — F329 Major depressive disorder, single episode, unspecified: Secondary | ICD-10-CM | POA: Diagnosis not present

## 2018-08-23 DIAGNOSIS — F5101 Primary insomnia: Secondary | ICD-10-CM | POA: Diagnosis not present

## 2018-08-24 DIAGNOSIS — C9000 Multiple myeloma not having achieved remission: Secondary | ICD-10-CM | POA: Diagnosis not present

## 2018-08-24 DIAGNOSIS — J449 Chronic obstructive pulmonary disease, unspecified: Secondary | ICD-10-CM | POA: Diagnosis not present

## 2018-08-25 DIAGNOSIS — J449 Chronic obstructive pulmonary disease, unspecified: Secondary | ICD-10-CM | POA: Diagnosis not present

## 2018-08-25 DIAGNOSIS — C9000 Multiple myeloma not having achieved remission: Secondary | ICD-10-CM | POA: Diagnosis not present

## 2018-08-27 DIAGNOSIS — C9000 Multiple myeloma not having achieved remission: Secondary | ICD-10-CM | POA: Diagnosis not present

## 2018-08-27 DIAGNOSIS — J449 Chronic obstructive pulmonary disease, unspecified: Secondary | ICD-10-CM | POA: Diagnosis not present

## 2018-08-30 DIAGNOSIS — R338 Other retention of urine: Secondary | ICD-10-CM | POA: Diagnosis not present

## 2018-08-31 DIAGNOSIS — J449 Chronic obstructive pulmonary disease, unspecified: Secondary | ICD-10-CM | POA: Diagnosis not present

## 2018-08-31 DIAGNOSIS — C9000 Multiple myeloma not having achieved remission: Secondary | ICD-10-CM | POA: Diagnosis not present

## 2018-09-02 DIAGNOSIS — C9000 Multiple myeloma not having achieved remission: Secondary | ICD-10-CM | POA: Diagnosis not present

## 2018-09-02 DIAGNOSIS — R197 Diarrhea, unspecified: Secondary | ICD-10-CM | POA: Diagnosis not present

## 2018-09-02 DIAGNOSIS — J449 Chronic obstructive pulmonary disease, unspecified: Secondary | ICD-10-CM | POA: Diagnosis not present

## 2018-09-03 DIAGNOSIS — J449 Chronic obstructive pulmonary disease, unspecified: Secondary | ICD-10-CM | POA: Diagnosis not present

## 2018-09-03 DIAGNOSIS — C9000 Multiple myeloma not having achieved remission: Secondary | ICD-10-CM | POA: Diagnosis not present

## 2018-09-06 DIAGNOSIS — J45998 Other asthma: Secondary | ICD-10-CM | POA: Diagnosis not present

## 2018-09-06 DIAGNOSIS — G4700 Insomnia, unspecified: Secondary | ICD-10-CM | POA: Diagnosis not present

## 2018-09-06 DIAGNOSIS — K5909 Other constipation: Secondary | ICD-10-CM | POA: Diagnosis not present

## 2018-09-06 DIAGNOSIS — R531 Weakness: Secondary | ICD-10-CM | POA: Diagnosis not present

## 2018-09-06 DIAGNOSIS — N401 Enlarged prostate with lower urinary tract symptoms: Secondary | ICD-10-CM | POA: Diagnosis not present

## 2018-09-06 DIAGNOSIS — E871 Hypo-osmolality and hyponatremia: Secondary | ICD-10-CM | POA: Diagnosis not present

## 2018-09-06 DIAGNOSIS — G8929 Other chronic pain: Secondary | ICD-10-CM | POA: Diagnosis not present

## 2018-09-06 DIAGNOSIS — I951 Orthostatic hypotension: Secondary | ICD-10-CM | POA: Diagnosis not present

## 2018-09-07 DIAGNOSIS — J449 Chronic obstructive pulmonary disease, unspecified: Secondary | ICD-10-CM | POA: Diagnosis not present

## 2018-09-07 DIAGNOSIS — C9000 Multiple myeloma not having achieved remission: Secondary | ICD-10-CM | POA: Diagnosis not present

## 2018-09-09 DIAGNOSIS — J449 Chronic obstructive pulmonary disease, unspecified: Secondary | ICD-10-CM | POA: Diagnosis not present

## 2018-09-09 DIAGNOSIS — C9000 Multiple myeloma not having achieved remission: Secondary | ICD-10-CM | POA: Diagnosis not present

## 2018-09-13 DIAGNOSIS — G8929 Other chronic pain: Secondary | ICD-10-CM | POA: Diagnosis not present

## 2018-09-13 DIAGNOSIS — R531 Weakness: Secondary | ICD-10-CM | POA: Diagnosis not present

## 2018-09-13 DIAGNOSIS — N401 Enlarged prostate with lower urinary tract symptoms: Secondary | ICD-10-CM | POA: Diagnosis not present

## 2018-09-13 DIAGNOSIS — R338 Other retention of urine: Secondary | ICD-10-CM | POA: Diagnosis not present

## 2018-09-13 DIAGNOSIS — G4700 Insomnia, unspecified: Secondary | ICD-10-CM | POA: Diagnosis not present

## 2018-09-13 DIAGNOSIS — R296 Repeated falls: Secondary | ICD-10-CM | POA: Diagnosis not present

## 2018-09-13 DIAGNOSIS — J45998 Other asthma: Secondary | ICD-10-CM | POA: Diagnosis not present

## 2018-09-13 DIAGNOSIS — I951 Orthostatic hypotension: Secondary | ICD-10-CM | POA: Diagnosis not present

## 2018-09-13 DIAGNOSIS — D649 Anemia, unspecified: Secondary | ICD-10-CM | POA: Diagnosis not present

## 2018-09-13 DIAGNOSIS — E871 Hypo-osmolality and hyponatremia: Secondary | ICD-10-CM | POA: Diagnosis not present

## 2018-09-13 DIAGNOSIS — K5909 Other constipation: Secondary | ICD-10-CM | POA: Diagnosis not present

## 2018-09-14 DIAGNOSIS — G8929 Other chronic pain: Secondary | ICD-10-CM | POA: Diagnosis not present

## 2018-09-14 DIAGNOSIS — K5909 Other constipation: Secondary | ICD-10-CM | POA: Diagnosis not present

## 2018-09-14 DIAGNOSIS — R296 Repeated falls: Secondary | ICD-10-CM | POA: Diagnosis not present

## 2018-09-14 DIAGNOSIS — N189 Chronic kidney disease, unspecified: Secondary | ICD-10-CM | POA: Diagnosis not present

## 2018-09-14 DIAGNOSIS — R531 Weakness: Secondary | ICD-10-CM | POA: Diagnosis not present

## 2018-09-14 DIAGNOSIS — N401 Enlarged prostate with lower urinary tract symptoms: Secondary | ICD-10-CM | POA: Diagnosis not present

## 2018-09-14 DIAGNOSIS — E871 Hypo-osmolality and hyponatremia: Secondary | ICD-10-CM | POA: Diagnosis not present

## 2018-09-14 DIAGNOSIS — I951 Orthostatic hypotension: Secondary | ICD-10-CM | POA: Diagnosis not present

## 2018-09-14 DIAGNOSIS — J45998 Other asthma: Secondary | ICD-10-CM | POA: Diagnosis not present

## 2018-09-14 DIAGNOSIS — G4700 Insomnia, unspecified: Secondary | ICD-10-CM | POA: Diagnosis not present

## 2018-09-15 DIAGNOSIS — R296 Repeated falls: Secondary | ICD-10-CM | POA: Diagnosis not present

## 2018-09-15 DIAGNOSIS — K5909 Other constipation: Secondary | ICD-10-CM | POA: Diagnosis not present

## 2018-09-15 DIAGNOSIS — G8929 Other chronic pain: Secondary | ICD-10-CM | POA: Diagnosis not present

## 2018-09-15 DIAGNOSIS — S298XXA Other specified injuries of thorax, initial encounter: Secondary | ICD-10-CM | POA: Diagnosis not present

## 2018-09-15 DIAGNOSIS — G4700 Insomnia, unspecified: Secondary | ICD-10-CM | POA: Diagnosis not present

## 2018-09-15 DIAGNOSIS — R079 Chest pain, unspecified: Secondary | ICD-10-CM | POA: Diagnosis not present

## 2018-09-15 DIAGNOSIS — R531 Weakness: Secondary | ICD-10-CM | POA: Diagnosis not present

## 2018-09-15 DIAGNOSIS — J45998 Other asthma: Secondary | ICD-10-CM | POA: Diagnosis not present

## 2018-09-15 DIAGNOSIS — M25551 Pain in right hip: Secondary | ICD-10-CM | POA: Diagnosis not present

## 2018-09-15 DIAGNOSIS — N401 Enlarged prostate with lower urinary tract symptoms: Secondary | ICD-10-CM | POA: Diagnosis not present

## 2018-09-15 DIAGNOSIS — I951 Orthostatic hypotension: Secondary | ICD-10-CM | POA: Diagnosis not present

## 2018-09-15 DIAGNOSIS — E871 Hypo-osmolality and hyponatremia: Secondary | ICD-10-CM | POA: Diagnosis not present

## 2018-09-16 DIAGNOSIS — R296 Repeated falls: Secondary | ICD-10-CM | POA: Diagnosis not present

## 2018-09-16 DIAGNOSIS — J45998 Other asthma: Secondary | ICD-10-CM | POA: Diagnosis not present

## 2018-09-16 DIAGNOSIS — I951 Orthostatic hypotension: Secondary | ICD-10-CM | POA: Diagnosis not present

## 2018-09-16 DIAGNOSIS — K5909 Other constipation: Secondary | ICD-10-CM | POA: Diagnosis not present

## 2018-09-16 DIAGNOSIS — H6120 Impacted cerumen, unspecified ear: Secondary | ICD-10-CM | POA: Diagnosis not present

## 2018-09-16 DIAGNOSIS — E871 Hypo-osmolality and hyponatremia: Secondary | ICD-10-CM | POA: Diagnosis not present

## 2018-09-16 DIAGNOSIS — N189 Chronic kidney disease, unspecified: Secondary | ICD-10-CM | POA: Diagnosis not present

## 2018-09-16 DIAGNOSIS — G8929 Other chronic pain: Secondary | ICD-10-CM | POA: Diagnosis not present

## 2018-09-16 DIAGNOSIS — N401 Enlarged prostate with lower urinary tract symptoms: Secondary | ICD-10-CM | POA: Diagnosis not present

## 2018-09-16 DIAGNOSIS — R531 Weakness: Secondary | ICD-10-CM | POA: Diagnosis not present

## 2018-09-16 DIAGNOSIS — G4700 Insomnia, unspecified: Secondary | ICD-10-CM | POA: Diagnosis not present

## 2018-09-21 DIAGNOSIS — G4700 Insomnia, unspecified: Secondary | ICD-10-CM | POA: Diagnosis not present

## 2018-09-21 DIAGNOSIS — R197 Diarrhea, unspecified: Secondary | ICD-10-CM | POA: Diagnosis not present

## 2018-09-21 DIAGNOSIS — G8929 Other chronic pain: Secondary | ICD-10-CM | POA: Diagnosis not present

## 2018-09-21 DIAGNOSIS — E871 Hypo-osmolality and hyponatremia: Secondary | ICD-10-CM | POA: Diagnosis not present

## 2018-09-21 DIAGNOSIS — N401 Enlarged prostate with lower urinary tract symptoms: Secondary | ICD-10-CM | POA: Diagnosis not present

## 2018-09-21 DIAGNOSIS — J45998 Other asthma: Secondary | ICD-10-CM | POA: Diagnosis not present

## 2018-09-21 DIAGNOSIS — I951 Orthostatic hypotension: Secondary | ICD-10-CM | POA: Diagnosis not present

## 2018-09-21 DIAGNOSIS — H6120 Impacted cerumen, unspecified ear: Secondary | ICD-10-CM | POA: Diagnosis not present

## 2018-09-21 DIAGNOSIS — R296 Repeated falls: Secondary | ICD-10-CM | POA: Diagnosis not present

## 2018-09-21 DIAGNOSIS — R531 Weakness: Secondary | ICD-10-CM | POA: Diagnosis not present

## 2018-09-21 DIAGNOSIS — K5909 Other constipation: Secondary | ICD-10-CM | POA: Diagnosis not present

## 2018-09-23 DIAGNOSIS — G8929 Other chronic pain: Secondary | ICD-10-CM | POA: Diagnosis not present

## 2018-09-23 DIAGNOSIS — N189 Chronic kidney disease, unspecified: Secondary | ICD-10-CM | POA: Diagnosis not present

## 2018-09-23 DIAGNOSIS — R296 Repeated falls: Secondary | ICD-10-CM | POA: Diagnosis not present

## 2018-09-23 DIAGNOSIS — I951 Orthostatic hypotension: Secondary | ICD-10-CM | POA: Diagnosis not present

## 2018-09-23 DIAGNOSIS — K5909 Other constipation: Secondary | ICD-10-CM | POA: Diagnosis not present

## 2018-09-23 DIAGNOSIS — E871 Hypo-osmolality and hyponatremia: Secondary | ICD-10-CM | POA: Diagnosis not present

## 2018-09-23 DIAGNOSIS — R531 Weakness: Secondary | ICD-10-CM | POA: Diagnosis not present

## 2018-09-23 DIAGNOSIS — J45998 Other asthma: Secondary | ICD-10-CM | POA: Diagnosis not present

## 2018-09-23 DIAGNOSIS — G4700 Insomnia, unspecified: Secondary | ICD-10-CM | POA: Diagnosis not present

## 2018-09-23 DIAGNOSIS — S51811A Laceration without foreign body of right forearm, initial encounter: Secondary | ICD-10-CM | POA: Diagnosis not present

## 2018-09-23 DIAGNOSIS — N401 Enlarged prostate with lower urinary tract symptoms: Secondary | ICD-10-CM | POA: Diagnosis not present

## 2018-09-23 DIAGNOSIS — R197 Diarrhea, unspecified: Secondary | ICD-10-CM | POA: Diagnosis not present

## 2018-09-24 DIAGNOSIS — K5909 Other constipation: Secondary | ICD-10-CM | POA: Diagnosis not present

## 2018-09-24 DIAGNOSIS — H6120 Impacted cerumen, unspecified ear: Secondary | ICD-10-CM | POA: Diagnosis not present

## 2018-09-24 DIAGNOSIS — G4700 Insomnia, unspecified: Secondary | ICD-10-CM | POA: Diagnosis not present

## 2018-09-24 DIAGNOSIS — G8929 Other chronic pain: Secondary | ICD-10-CM | POA: Diagnosis not present

## 2018-09-24 DIAGNOSIS — R197 Diarrhea, unspecified: Secondary | ICD-10-CM | POA: Diagnosis not present

## 2018-09-24 DIAGNOSIS — E871 Hypo-osmolality and hyponatremia: Secondary | ICD-10-CM | POA: Diagnosis not present

## 2018-09-24 DIAGNOSIS — R531 Weakness: Secondary | ICD-10-CM | POA: Diagnosis not present

## 2018-09-24 DIAGNOSIS — I951 Orthostatic hypotension: Secondary | ICD-10-CM | POA: Diagnosis not present

## 2018-09-24 DIAGNOSIS — S51811A Laceration without foreign body of right forearm, initial encounter: Secondary | ICD-10-CM | POA: Diagnosis not present

## 2018-09-24 DIAGNOSIS — R296 Repeated falls: Secondary | ICD-10-CM | POA: Diagnosis not present

## 2018-09-24 DIAGNOSIS — N401 Enlarged prostate with lower urinary tract symptoms: Secondary | ICD-10-CM | POA: Diagnosis not present

## 2018-09-24 DIAGNOSIS — J45998 Other asthma: Secondary | ICD-10-CM | POA: Diagnosis not present

## 2018-09-27 DIAGNOSIS — K5909 Other constipation: Secondary | ICD-10-CM | POA: Diagnosis not present

## 2018-09-27 DIAGNOSIS — R05 Cough: Secondary | ICD-10-CM | POA: Diagnosis not present

## 2018-09-27 DIAGNOSIS — G8929 Other chronic pain: Secondary | ICD-10-CM | POA: Diagnosis not present

## 2018-09-27 DIAGNOSIS — J45998 Other asthma: Secondary | ICD-10-CM | POA: Diagnosis not present

## 2018-09-27 DIAGNOSIS — R296 Repeated falls: Secondary | ICD-10-CM | POA: Diagnosis not present

## 2018-09-27 DIAGNOSIS — H6120 Impacted cerumen, unspecified ear: Secondary | ICD-10-CM | POA: Diagnosis not present

## 2018-09-27 DIAGNOSIS — S51811A Laceration without foreign body of right forearm, initial encounter: Secondary | ICD-10-CM | POA: Diagnosis not present

## 2018-09-27 DIAGNOSIS — G4700 Insomnia, unspecified: Secondary | ICD-10-CM | POA: Diagnosis not present

## 2018-09-27 DIAGNOSIS — R197 Diarrhea, unspecified: Secondary | ICD-10-CM | POA: Diagnosis not present

## 2018-09-27 DIAGNOSIS — R531 Weakness: Secondary | ICD-10-CM | POA: Diagnosis not present

## 2018-09-27 DIAGNOSIS — N401 Enlarged prostate with lower urinary tract symptoms: Secondary | ICD-10-CM | POA: Diagnosis not present

## 2018-09-27 DIAGNOSIS — E871 Hypo-osmolality and hyponatremia: Secondary | ICD-10-CM | POA: Diagnosis not present

## 2018-09-27 DIAGNOSIS — I951 Orthostatic hypotension: Secondary | ICD-10-CM | POA: Diagnosis not present

## 2019-09-30 DEATH — deceased
